# Patient Record
Sex: Female | Born: 1945
Health system: Southern US, Community
[De-identification: ages and names within clinical notes are randomized; demographics above are authoritative.]

## PROBLEM LIST (undated history)

## (undated) DIAGNOSIS — N200 Calculus of kidney: Secondary | ICD-10-CM

## (undated) DIAGNOSIS — I6529 Occlusion and stenosis of unspecified carotid artery: Secondary | ICD-10-CM

## (undated) DIAGNOSIS — Z8619 Personal history of other infectious and parasitic diseases: Secondary | ICD-10-CM

## (undated) DIAGNOSIS — E785 Hyperlipidemia, unspecified: Secondary | ICD-10-CM

## (undated) DIAGNOSIS — E079 Disorder of thyroid, unspecified: Secondary | ICD-10-CM

## (undated) DIAGNOSIS — M810 Age-related osteoporosis without current pathological fracture: Secondary | ICD-10-CM

## (undated) DIAGNOSIS — H269 Unspecified cataract: Secondary | ICD-10-CM

## (undated) DIAGNOSIS — I10 Essential (primary) hypertension: Secondary | ICD-10-CM

## (undated) HISTORY — DX: Calculus of kidney: N20.0

## (undated) HISTORY — DX: Personal history of other infectious and parasitic diseases: Z86.19

## (undated) HISTORY — DX: Essential (primary) hypertension: I10

## (undated) HISTORY — DX: Disorder of thyroid, unspecified: E07.9

## (undated) HISTORY — DX: Occlusion and stenosis of unspecified carotid artery: I65.29

## (undated) HISTORY — DX: Age-related osteoporosis without current pathological fracture: M81.0

## (undated) HISTORY — DX: Hyperlipidemia, unspecified: E78.5

## (undated) HISTORY — PX: OOPHORECTOMY: SHX86

## (undated) HISTORY — DX: Unspecified cataract: H26.9

---

## 1970-10-16 DIAGNOSIS — N200 Calculus of kidney: Secondary | ICD-10-CM

## 1970-10-16 HISTORY — DX: Calculus of kidney: N20.0

## 1981-10-16 HISTORY — PX: ABDOMINAL HYSTERECTOMY: SHX81

## 1998-10-06 ENCOUNTER — Other Ambulatory Visit: Admission: RE | Admit: 1998-10-06 | Discharge: 1998-10-06 | Payer: Self-pay | Admitting: Obstetrics and Gynecology

## 1999-10-19 ENCOUNTER — Other Ambulatory Visit: Admission: RE | Admit: 1999-10-19 | Discharge: 1999-10-19 | Payer: Self-pay | Admitting: Obstetrics and Gynecology

## 2000-05-25 ENCOUNTER — Encounter (INDEPENDENT_AMBULATORY_CARE_PROVIDER_SITE_OTHER): Payer: Self-pay | Admitting: Specialist

## 2000-05-25 ENCOUNTER — Other Ambulatory Visit: Admission: RE | Admit: 2000-05-25 | Discharge: 2000-05-25 | Payer: Self-pay | Admitting: Obstetrics and Gynecology

## 2000-11-08 ENCOUNTER — Other Ambulatory Visit: Admission: RE | Admit: 2000-11-08 | Discharge: 2000-11-08 | Payer: Self-pay | Admitting: Obstetrics and Gynecology

## 2001-11-11 ENCOUNTER — Other Ambulatory Visit: Admission: RE | Admit: 2001-11-11 | Discharge: 2001-11-11 | Payer: Self-pay | Admitting: Gynecology

## 2002-11-12 ENCOUNTER — Other Ambulatory Visit: Admission: RE | Admit: 2002-11-12 | Discharge: 2002-11-12 | Payer: Self-pay | Admitting: Gynecology

## 2003-12-25 ENCOUNTER — Other Ambulatory Visit: Admission: RE | Admit: 2003-12-25 | Discharge: 2003-12-25 | Payer: Self-pay | Admitting: Gynecology

## 2005-04-10 ENCOUNTER — Other Ambulatory Visit: Admission: RE | Admit: 2005-04-10 | Discharge: 2005-04-10 | Payer: Self-pay | Admitting: Gynecology

## 2006-06-04 ENCOUNTER — Other Ambulatory Visit: Admission: RE | Admit: 2006-06-04 | Discharge: 2006-06-04 | Payer: Self-pay | Admitting: Gynecology

## 2007-06-06 ENCOUNTER — Other Ambulatory Visit: Admission: RE | Admit: 2007-06-06 | Discharge: 2007-06-06 | Payer: Self-pay | Admitting: Gynecology

## 2008-10-16 DIAGNOSIS — E079 Disorder of thyroid, unspecified: Secondary | ICD-10-CM

## 2008-10-16 HISTORY — DX: Disorder of thyroid, unspecified: E07.9

## 2011-01-30 ENCOUNTER — Encounter: Payer: Self-pay | Admitting: Internal Medicine

## 2011-01-30 ENCOUNTER — Ambulatory Visit (INDEPENDENT_AMBULATORY_CARE_PROVIDER_SITE_OTHER): Payer: Medicare Other | Admitting: Internal Medicine

## 2011-01-30 DIAGNOSIS — Z0000 Encounter for general adult medical examination without abnormal findings: Secondary | ICD-10-CM

## 2011-01-30 DIAGNOSIS — E785 Hyperlipidemia, unspecified: Secondary | ICD-10-CM

## 2011-01-30 DIAGNOSIS — I10 Essential (primary) hypertension: Secondary | ICD-10-CM

## 2011-01-30 NOTE — Progress Notes (Signed)
Subjective:    Patient ID: Brittany Mathis, female    DOB: 09/23/1946, 65 y.o.   MRN: [id number]  HPI New pt - well C/o HTN and hyperlipidemia  Review of Systems  Constitutional: Negative.  Negative for fever, chills, diaphoresis, activity change, appetite change, fatigue and unexpected weight change.  HENT: Negative for hearing loss, ear pain, nosebleeds, congestion, sore throat, facial swelling, rhinorrhea, sneezing, mouth sores, trouble swallowing, neck pain, neck stiffness, postnasal drip, sinus pressure and tinnitus.   Eyes: Negative for pain, discharge, redness, itching and visual disturbance.  Respiratory: Negative for cough, chest tightness, shortness of breath, wheezing and stridor.   Cardiovascular: Negative for chest pain, palpitations and leg swelling.  Gastrointestinal: Negative for nausea, diarrhea, constipation, blood in stool, abdominal distention, anal bleeding and rectal pain.  Genitourinary: Negative for dysuria, urgency, frequency, hematuria, flank pain, vaginal bleeding, vaginal discharge, difficulty urinating, genital sores and pelvic pain.  Musculoskeletal: Positive for back pain. Negative for joint swelling, arthralgias and gait problem.  Skin: Negative.  Negative for rash.  Neurological: Negative for dizziness, tremors, seizures, syncope, speech difficulty, weakness, numbness and headaches.  Hematological: Negative for adenopathy. Does not bruise/bleed easily.  Psychiatric/Behavioral: Negative for suicidal ideas, behavioral problems, sleep disturbance, dysphoric mood and decreased concentration. The patient is not nervous/anxious.        Objective:   Physical Exam  Constitutional: She appears well-developed and well-nourished. No distress.  HENT:  Head: Normocephalic.  Right Ear: External ear normal.  Left Ear: External ear normal.  Nose: Nose normal.  Mouth/Throat: Oropharynx is clear and moist.  Eyes: Conjunctivae are normal. Pupils are equal, round, and  reactive to light. Right eye exhibits no discharge. Left eye exhibits no discharge.  Neck: Normal range of motion. Neck supple. No JVD present. No tracheal deviation present. No thyromegaly present.  Cardiovascular: Normal rate, regular rhythm and normal heart sounds.   Pulmonary/Chest: No stridor. No respiratory distress. She has no wheezes.  Abdominal: Soft. Bowel sounds are normal. She exhibits no distension and no mass. There is no tenderness. There is no rebound and no guarding.  Musculoskeletal: She exhibits tenderness (LS w/decr ROM; tight hamstrings). She exhibits no edema.  Lymphadenopathy:    She has no cervical adenopathy.  Neurological: She displays normal reflexes. No cranial nerve deficit. She exhibits normal muscle tone. Coordination normal.  Skin: No rash noted. No erythema.  Psychiatric: She has a normal mood and affect. Her behavior is normal. Judgment and thought content normal.    C/o HTN and hyperlipidemia       Assessment & Plan:  Well Exam The patient is here for annual Medicare wellness examination and management of other chronic and acute problems.   The risk factors are reflected in the social history.  The roster of all physicians providing medical care to patient - is listed in the Snapshot section of the chart.  Activities of daily living:  The patient is 100% inedpendent in all ADLs: dressing, toileting, feeding as well as independent mobility  Home safety : The patient has smoke detectors in the home. They wear seatbelts.No firearms at home ( firearms are present in the home, kept in a safe fashion). There is no violence in the home.   There is no risks for hepatitis, STDs or HIV. There is no   history of blood transfusion. They have no travel history to infectious disease endemic areas of the world.  The patient has (has not) seen their dentist in the last six month. They  have (not) seen their eye doctor in the last year. They deny (admit to) any  hearing difficulty and have not had audiologic testing in the last year.  They do not  have excessive sun exposure. Discussed the need for sun protection: hats, long sleeves and use of sunscreen if there is significant sun exposure.   Diet: the importance of a healthy diet is discussed. They do have a healthy (unhealthy-high fat/fast food) diet.  The patient has a regular exercise program: walking ______30_ , min ____duration, ____3-4_per week.  The benefits of regular aerobic exercise were discussed.  Depression screen: there are no signs or vegative symptoms of depression- irritability, change in appetite, anhedonia, sadness/tearfullness.  Cognitive assessment: the patient manages all their financial and personal affairs and is actively engaged. They could relate day,date,year and events; recalled 3/3 objects at 3 minutes; performed clock-face test normally.  The following portions of the patient's history were reviewed and updated as appropriate: allergies, current medications, past family history, past medical history,  past surgical history, past social history  and problem list.  Vision, hearing, body mass index were assessed and reviewed.   During the course of the visit the patient was educated and counseled about appropriate screening and preventive services including : fall prevention , diabetes screening, nutrition counseling, colorectal cancer screening, and recommended immunizations.   Col polyp - f/u with Dr Brittany Mathis Zostavax discussed DT advised We will get labs. Age related issues discussed. Mammo, BDS, HRT per Dr Brittany Mathis  HRT   Potential benefits of a long term HRT use as well as potential risks  and complications were explained to the patient and were aknowledged.  LBP - advised stretching, ie yoga

## 2011-01-31 ENCOUNTER — Encounter: Payer: Self-pay | Admitting: Internal Medicine

## 2011-01-31 ENCOUNTER — Telehealth: Payer: Self-pay | Admitting: Internal Medicine

## 2011-01-31 NOTE — Telephone Encounter (Signed)
Forwarded to Dr. Plotnikov for review. °

## 2011-02-06 ENCOUNTER — Encounter: Payer: Self-pay | Admitting: Internal Medicine

## 2011-02-09 ENCOUNTER — Encounter: Payer: Self-pay | Admitting: Internal Medicine

## 2011-08-07 ENCOUNTER — Encounter: Payer: Self-pay | Admitting: Internal Medicine

## 2011-08-07 ENCOUNTER — Ambulatory Visit (INDEPENDENT_AMBULATORY_CARE_PROVIDER_SITE_OTHER): Payer: Medicare Other | Admitting: Internal Medicine

## 2011-08-07 VITALS — BP 160/90 | HR 80 | Temp 97.5°F | Resp 16 | Wt 122.0 lb

## 2011-08-07 DIAGNOSIS — M5412 Radiculopathy, cervical region: Secondary | ICD-10-CM

## 2011-08-07 DIAGNOSIS — I10 Essential (primary) hypertension: Secondary | ICD-10-CM

## 2011-08-07 MED ORDER — VALSARTAN 80 MG PO TABS: 80.0000 mg | ORAL_TABLET | Freq: Every day | ORAL | Status: DC

## 2011-08-07 MED ORDER — SIMVASTATIN 40 MG PO TABS: 40.0000 mg | ORAL_TABLET | Freq: Every day | ORAL | Status: DC

## 2011-08-07 NOTE — Assessment & Plan Note (Signed)
Start Diovan if BP is

## 2011-08-07 NOTE — Patient Instructions (Addendum)
Normal BP < 130/85  BP Readings from Last 3 Encounters:  08/07/11 160/90  01/30/11 122/80

## 2011-08-07 NOTE — Progress Notes (Signed)
  Subjective:    Patient ID: Brittany Mathis, female    DOB: October 04, 1946, 65 y.o.   MRN: [id number]  HPI  The patient presents for a follow-up of  chronic hypertension, chronic dyslipidemia controlled with medicines. C/o R arm pain at times    Review of Systems  Constitutional: Negative for chills, activity change, appetite change, fatigue and unexpected weight change.  HENT: Negative for congestion, mouth sores and sinus pressure.   Eyes: Negative for visual disturbance.  Respiratory: Negative for cough and chest tightness.   Gastrointestinal: Negative for nausea and abdominal pain.  Genitourinary: Negative for frequency, difficulty urinating and vaginal pain.  Musculoskeletal: Negative for back pain and gait problem.  Skin: Negative for pallor and rash.  Neurological: Negative for dizziness, tremors, weakness, numbness and headaches.  Psychiatric/Behavioral: Negative for confusion and sleep disturbance.       Objective:   Physical Exam  Constitutional: She appears well-developed and well-nourished. No distress.  HENT:  Head: Normocephalic.  Right Ear: External ear normal.  Left Ear: External ear normal.  Nose: Nose normal.  Mouth/Throat: Oropharynx is clear and moist.  Eyes: Conjunctivae are normal. Pupils are equal, round, and reactive to light. Right eye exhibits no discharge. Left eye exhibits no discharge.  Neck: Normal range of motion. Neck supple. No JVD present. No tracheal deviation present. No thyromegaly present.  Cardiovascular: Normal rate, regular rhythm and normal heart sounds.   Pulmonary/Chest: No stridor. No respiratory distress. She has no wheezes.  Abdominal: Soft. Bowel sounds are normal. She exhibits no distension and no mass. There is no tenderness. There is no rebound and no guarding.  Musculoskeletal: She exhibits no edema and no tenderness.  Lymphadenopathy:    She has no cervical adenopathy.  Neurological: She displays normal reflexes. No cranial nerve  deficit. She exhibits normal muscle tone. Coordination normal.  Skin: No rash noted. No erythema.  Psychiatric: She has a normal mood and affect. Her behavior is normal. Judgment and thought content normal.          Assessment & Plan:

## 2011-11-07 ENCOUNTER — Other Ambulatory Visit: Payer: Self-pay

## 2011-11-07 MED ORDER — SIMVASTATIN 40 MG PO TABS: 40.0000 mg | ORAL_TABLET | Freq: Every day | ORAL | Status: DC

## 2011-11-07 MED ORDER — VALSARTAN 80 MG PO TABS: 80.0000 mg | ORAL_TABLET | Freq: Every day | ORAL | Status: DC

## 2011-11-08 ENCOUNTER — Telehealth: Payer: Self-pay

## 2011-11-08 NOTE — Telephone Encounter (Signed)
Needs OV w/any MD I can see her on 1/24  1:30 pm or so Thx

## 2011-11-08 NOTE — Telephone Encounter (Signed)
The patients right lower arm has become swollen, raised, bruised and sore all since yesterday afternoon. The patient is concerned about a blood clot. Call back  Number is [phone]

## 2011-11-09 ENCOUNTER — Ambulatory Visit (INDEPENDENT_AMBULATORY_CARE_PROVIDER_SITE_OTHER): Payer: Medicare Other | Admitting: Internal Medicine

## 2011-11-09 ENCOUNTER — Encounter: Payer: Self-pay | Admitting: Internal Medicine

## 2011-11-09 DIAGNOSIS — T148XXA Other injury of unspecified body region, initial encounter: Secondary | ICD-10-CM

## 2011-11-09 DIAGNOSIS — K130 Diseases of lips: Secondary | ICD-10-CM

## 2011-11-09 DIAGNOSIS — I10 Essential (primary) hypertension: Secondary | ICD-10-CM

## 2011-11-09 DIAGNOSIS — S40029A Contusion of unspecified upper arm, initial encounter: Secondary | ICD-10-CM

## 2011-11-09 MED ORDER — CLOTRIMAZOLE-BETAMETHASONE 1-0.05 % EX CREA: TOPICAL_CREAM | Freq: Two times a day (BID) | CUTANEOUS | Status: DC

## 2011-11-09 NOTE — Assessment & Plan Note (Signed)
Lotrisone 

## 2011-11-09 NOTE — Telephone Encounter (Signed)
Pt informed. OV scheduled.

## 2011-11-09 NOTE — Progress Notes (Signed)
  Subjective:    Patient ID: Brittany Mathis, female    DOB: [birth year], 66 y.o.   MRN: [id number]  HPI  C/o R forearm bruise,  2 d ago C/o skin irritation in the corners of her mouth x 2 wks F/u HTN  Review of Systems  Constitutional: Negative for chills, activity change, appetite change, fatigue and unexpected weight change.  HENT: Negative for congestion, mouth sores and sinus pressure.   Eyes: Negative for visual disturbance.  Respiratory: Negative for cough and chest tightness.   Gastrointestinal: Negative for nausea and abdominal pain.  Genitourinary: Negative for frequency, difficulty urinating and vaginal pain.  Musculoskeletal: Negative for back pain and gait problem.  Skin: Negative for pallor and rash.       occ bruises  Neurological: Negative for dizziness, tremors, weakness, numbness and headaches.  Psychiatric/Behavioral: Negative for confusion and sleep disturbance.       Objective:   Physical Exam  Constitutional: She appears well-developed. No distress.  HENT:  Head: Normocephalic.  Right Ear: External ear normal.  Left Ear: External ear normal.  Nose: Nose normal.  Mouth/Throat: Oropharynx is clear and moist.  Eyes: Conjunctivae are normal. Pupils are equal, round, and reactive to light. Right eye exhibits no discharge. Left eye exhibits no discharge.  Neck: Normal range of motion. Neck supple. No JVD present. No tracheal deviation present. No thyromegaly present.  Cardiovascular: Normal rate, regular rhythm and normal heart sounds.   Pulmonary/Chest: No stridor. No respiratory distress. She has no wheezes.  Abdominal: Soft. Bowel sounds are normal. She exhibits no distension and no mass. There is no tenderness. There is no rebound and no guarding.  Musculoskeletal: She exhibits no edema and no tenderness.  Lymphadenopathy:    She has no cervical adenopathy.  Neurological: She displays normal reflexes. No cranial nerve deficit. She exhibits normal muscle tone.  Coordination normal.  Skin: No rash noted. No erythema.       R forearm 5x2 cm bruise with a 1 cm palpable sq knot  Psychiatric: She has a normal mood and affect. Her behavior is normal. Judgment and thought content normal.      Procedure Note :    Procedure :   Point of care (POC) sonography examination   Indication: L forearm bruise, r/o phlebitis   Equipment used: Sonosite M-Turbo with HFL38x/13-6 MHz transducer linear probe. The images were stored in the unit and later transferred in storage.  The patient was placed in a sitting position.  This study revealed a heteroechoic small  lesion in the R mid palmar forearm c/w hematoma. Superficial veins are potent and compressible   Impression: Small hematoma. No phlebitis.       Assessment & Plan:

## 2011-11-09 NOTE — Assessment & Plan Note (Signed)
Valsartan is in the mail: start asap

## 2011-11-09 NOTE — Assessment & Plan Note (Signed)
Hold ASA?

## 2012-02-05 ENCOUNTER — Ambulatory Visit (INDEPENDENT_AMBULATORY_CARE_PROVIDER_SITE_OTHER): Payer: Medicare Other | Admitting: Internal Medicine

## 2012-02-05 ENCOUNTER — Other Ambulatory Visit (INDEPENDENT_AMBULATORY_CARE_PROVIDER_SITE_OTHER): Payer: Medicare Other

## 2012-02-05 ENCOUNTER — Encounter: Payer: Self-pay | Admitting: Internal Medicine

## 2012-02-05 VITALS — BP 150/90 | HR 80 | Temp 97.5°F | Resp 16 | Ht 64.0 in | Wt 125.0 lb

## 2012-02-05 DIAGNOSIS — E785 Hyperlipidemia, unspecified: Secondary | ICD-10-CM

## 2012-02-05 DIAGNOSIS — I10 Essential (primary) hypertension: Secondary | ICD-10-CM

## 2012-02-05 DIAGNOSIS — R1032 Left lower quadrant pain: Secondary | ICD-10-CM | POA: Insufficient documentation

## 2012-02-05 DIAGNOSIS — Z136 Encounter for screening for cardiovascular disorders: Secondary | ICD-10-CM

## 2012-02-05 DIAGNOSIS — Z0000 Encounter for general adult medical examination without abnormal findings: Secondary | ICD-10-CM | POA: Insufficient documentation

## 2012-02-05 DIAGNOSIS — R3129 Other microscopic hematuria: Secondary | ICD-10-CM

## 2012-02-05 DIAGNOSIS — Z23 Encounter for immunization: Secondary | ICD-10-CM

## 2012-02-05 LAB — CBC WITH DIFFERENTIAL/PLATELET
Basophils Absolute: 0 K/uL (ref 0.0–0.1)
Basophils Relative: 0.4 % (ref 0.0–3.0)
Eosinophils Absolute: 0.4 K/uL (ref 0.0–0.7)
Eosinophils Relative: 8.1 % — ABNORMAL HIGH (ref 0.0–5.0)
HCT: 38.8 % (ref 36.0–46.0)
Hemoglobin: 13.1 g/dL (ref 12.0–15.0)
Lymphocytes Relative: 32.4 % (ref 12.0–46.0)
Lymphs Abs: 1.7 K/uL (ref 0.7–4.0)
MCHC: 33.7 g/dL (ref 30.0–36.0)
MCV: 88 fl (ref 78.0–100.0)
Monocytes Absolute: 0.5 K/uL (ref 0.1–1.0)
Monocytes Relative: 9.6 % (ref 3.0–12.0)
Neutro Abs: 2.6 K/uL (ref 1.4–7.7)
Neutrophils Relative %: 49.5 % (ref 43.0–77.0)
Platelets: 206 K/uL (ref 150.0–400.0)
RBC: 4.41 Mil/uL (ref 3.87–5.11)
RDW: 12.7 % (ref 11.5–14.6)
WBC: 5.3 K/uL (ref 4.5–10.5)

## 2012-02-05 LAB — LDL CHOLESTEROL, DIRECT: Direct LDL: 109.2 mg/dL

## 2012-02-05 LAB — URINALYSIS, ROUTINE W REFLEX MICROSCOPIC
Ketones, ur: NEGATIVE
Urine Glucose: NEGATIVE
Urobilinogen, UA: 0.2 (ref 0.0–1.0)

## 2012-02-05 LAB — HEPATIC FUNCTION PANEL
ALT: 20 U/L (ref 0–35)
AST: 25 U/L (ref 0–37)
Albumin: 4.5 g/dL (ref 3.5–5.2)
Alkaline Phosphatase: 41 U/L (ref 39–117)
Bilirubin, Direct: 0.1 mg/dL (ref 0.0–0.3)
Total Bilirubin: 0.7 mg/dL (ref 0.3–1.2)
Total Protein: 7.2 g/dL (ref 6.0–8.3)

## 2012-02-05 LAB — BASIC METABOLIC PANEL WITH GFR
BUN: 17 mg/dL (ref 6–23)
CO2: 29 meq/L (ref 19–32)
Calcium: 9.6 mg/dL (ref 8.4–10.5)
Chloride: 103 meq/L (ref 96–112)
Creatinine, Ser: 0.6 mg/dL (ref 0.4–1.2)
GFR: 100.08 mL/min
Glucose, Bld: 94 mg/dL (ref 70–99)
Potassium: 4.8 meq/L (ref 3.5–5.1)
Sodium: 140 meq/L (ref 135–145)

## 2012-02-05 LAB — LIPID PANEL
Cholesterol: 226 mg/dL — ABNORMAL HIGH (ref 0–200)
HDL: 106.5 mg/dL
Total CHOL/HDL Ratio: 2
Triglycerides: 54 mg/dL (ref 0.0–149.0)
VLDL: 10.8 mg/dL (ref 0.0–40.0)

## 2012-02-05 LAB — TSH: TSH: 3.46 u[IU]/mL (ref 0.35–5.50)

## 2012-02-05 MED ORDER — VALSARTAN 160 MG PO TABS: 160.0000 mg | ORAL_TABLET | Freq: Every day | ORAL | Status: DC

## 2012-02-05 MED ORDER — SIMVASTATIN 20 MG PO TABS: 20.0000 mg | ORAL_TABLET | Freq: Every day | ORAL | Status: DC

## 2012-02-05 NOTE — Progress Notes (Deleted)
  Subjective:    Patient ID: Brittany Mathis, female    DOB: 1946-02-02, 66 y.o.   MRN: [id number]  HPI  The patient is here for a wellness exam. The patient has been doing well overall without major physical or psychological issues going on lately.  Review of Systems     Objective:   Physical Exam        Assessment & Plan:

## 2012-02-05 NOTE — Assessment & Plan Note (Signed)
Increase Diovan dose

## 2012-02-05 NOTE — Assessment & Plan Note (Signed)
Will reduce Simvastatin

## 2012-02-05 NOTE — Patient Instructions (Signed)
Please see Dr Hazle Quant

## 2012-02-05 NOTE — Assessment & Plan Note (Signed)
4/13  X 10 d CT suggested Labs

## 2012-02-06 ENCOUNTER — Telehealth: Payer: Self-pay | Admitting: Internal Medicine

## 2012-02-06 DIAGNOSIS — R1032 Left lower quadrant pain: Secondary | ICD-10-CM

## 2012-02-06 NOTE — Telephone Encounter (Signed)
Misty Stanley, please, inform patient that all labs are normal except for red blood cells in the urine presence. I will order an abd CT to r/o kidney stones etc Thx

## 2012-02-06 NOTE — Telephone Encounter (Signed)
Pt informed

## 2012-02-08 ENCOUNTER — Ambulatory Visit (INDEPENDENT_AMBULATORY_CARE_PROVIDER_SITE_OTHER)
Admission: RE | Admit: 2012-02-08 | Discharge: 2012-02-08 | Disposition: A | Discharge status: Home / Self Care | Payer: Medicare Other | Source: Ambulatory Visit | Attending: Internal Medicine | Admitting: Internal Medicine

## 2012-02-08 ENCOUNTER — Telehealth: Payer: Self-pay | Admitting: Internal Medicine

## 2012-02-08 DIAGNOSIS — R1032 Left lower quadrant pain: Secondary | ICD-10-CM

## 2012-02-08 DIAGNOSIS — R319 Hematuria, unspecified: Secondary | ICD-10-CM

## 2012-02-08 NOTE — Telephone Encounter (Signed)
Brittany Mathis, please, inform patient that the CT is showing B kidney stones. We need to sch  A urol consult. Is it ok to schedule? Thx

## 2012-02-09 NOTE — Telephone Encounter (Signed)
Pt informed

## 2012-02-11 DIAGNOSIS — R3129 Other microscopic hematuria: Secondary | ICD-10-CM | POA: Insufficient documentation

## 2012-02-11 NOTE — Assessment & Plan Note (Signed)
CT abd Urol cons

## 2012-02-11 NOTE — Progress Notes (Signed)
Patient ID: Brittany Mathis, female   DOB: 1946/04/04, 66 y.o.   MRN: [id number]  Subjective:    Patient ID: Brittany Mathis, female    DOB: 1946-10-02, 66 y.o.   MRN: [id number]  HPI The patient is here for a wellness exam. The patient has been doing well overall without major physical or psychological issues going on lately, except for pain in LLQ x 1-2 wks - mild to moderate. No n/v; no chills   The patient presents for a follow-up of  chronic hypertension, chronic dyslipidemia controlled with medicines. C/o R arm pain at times  Wt Readings from Last 3 Encounters:  02/05/12 125 lb (56.7 kg)  11/09/11 128 lb (58.06 kg)  08/07/11 122 lb (55.339 kg)   BP Readings from Last 3 Encounters:  02/05/12 150/90  11/09/11 150/86  08/07/11 160/90      Review of Systems  Constitutional: Negative for fever, chills, diaphoresis, activity change, appetite change, fatigue and unexpected weight change.  HENT: Negative for hearing loss, ear pain, congestion, sore throat, sneezing, mouth sores, neck pain, dental problem, voice change, postnasal drip and sinus pressure.   Eyes: Negative for pain and visual disturbance.  Respiratory: Negative for cough, chest tightness, wheezing and stridor.   Cardiovascular: Negative for chest pain, palpitations and leg swelling.  Gastrointestinal: Positive for abdominal pain. Negative for nausea, vomiting, blood in stool, abdominal distention and rectal pain.  Genitourinary: Negative for dysuria, frequency, hematuria, decreased urine volume, vaginal bleeding, vaginal discharge, difficulty urinating, vaginal pain and menstrual problem.  Musculoskeletal: Negative for back pain, joint swelling and gait problem.  Skin: Negative for color change, pallor, rash and wound.  Neurological: Negative for dizziness, tremors, syncope, speech difficulty, weakness, light-headedness, numbness and headaches.  Hematological: Negative for adenopathy.  Psychiatric/Behavioral: Negative for  suicidal ideas, hallucinations, behavioral problems, confusion, sleep disturbance, dysphoric mood and decreased concentration. The patient is not hyperactive.        Objective:   Physical Exam  Constitutional: She appears well-developed and well-nourished. No distress.  HENT:  Head: Normocephalic.  Right Ear: External ear normal.  Left Ear: External ear normal.  Nose: Nose normal.  Mouth/Throat: Oropharynx is clear and moist.  Eyes: Conjunctivae are normal. Pupils are equal, round, and reactive to light. Right eye exhibits no discharge. Left eye exhibits no discharge.  Neck: Normal range of motion. Neck supple. No JVD present. No tracheal deviation present. No thyromegaly present.  Cardiovascular: Normal rate, regular rhythm and normal heart sounds.   Pulmonary/Chest: No stridor. No respiratory distress. She has no wheezes. She exhibits no tenderness.  Abdominal: Soft. Bowel sounds are normal. She exhibits no distension and no mass. There is tenderness (LLQ was a little sensitive to palpation). There is no rebound and no guarding.  Musculoskeletal: She exhibits no edema and no tenderness.  Lymphadenopathy:    She has no cervical adenopathy.  Neurological: She displays normal reflexes. No cranial nerve deficit. She exhibits normal muscle tone. Coordination normal.  Skin: No rash noted. No erythema.  Psychiatric: She has a normal mood and affect. Her behavior is normal. Judgment and thought content normal.   Lab Results  Component Value Date   WBC 5.3 02/05/2012   HGB 13.1 02/05/2012   HCT 38.8 02/05/2012   PLT 206.0 02/05/2012   GLUCOSE 94 02/05/2012   CHOL 226* 02/05/2012   TRIG 54.0 02/05/2012   HDL 106.50 02/05/2012   LDLDIRECT 109.2 02/05/2012   ALT 20 02/05/2012   AST 25 02/05/2012   NA  140 02/05/2012   K 4.8 02/05/2012   CL 103 02/05/2012   CREATININE 0.6 02/05/2012   BUN 17 02/05/2012   CO2 29 02/05/2012   TSH 3.46 02/05/2012          Assessment & Plan:

## 2012-02-19 ENCOUNTER — Telehealth: Payer: Self-pay

## 2012-02-19 DIAGNOSIS — N200 Calculus of kidney: Secondary | ICD-10-CM

## 2012-02-19 NOTE — Telephone Encounter (Signed)
Patient called LM on triage VM stating that nurse called 02/09/12 to advise of bilateral kidney stones on CT scan. Per pt she was told that MD would set up a urology referral and would like to check the status. Please advise, didn't find a referral in Epic Thanks

## 2012-02-19 NOTE — Telephone Encounter (Signed)
I was sure I  Have put it in - sorry if I did not do it Will re-do Thx

## 2012-02-19 NOTE — Telephone Encounter (Signed)
[facility] to notify 

## 2012-03-12 ENCOUNTER — Encounter: Payer: Self-pay | Admitting: Internal Medicine

## 2012-06-06 ENCOUNTER — Encounter: Payer: Self-pay | Admitting: Internal Medicine

## 2012-06-06 ENCOUNTER — Ambulatory Visit (INDEPENDENT_AMBULATORY_CARE_PROVIDER_SITE_OTHER): Payer: Medicare Other | Admitting: Internal Medicine

## 2012-06-06 VITALS — BP 130/80 | HR 80 | Temp 96.8°F | Resp 16 | Wt 128.0 lb

## 2012-06-06 DIAGNOSIS — R1032 Left lower quadrant pain: Secondary | ICD-10-CM

## 2012-06-06 DIAGNOSIS — N200 Calculus of kidney: Secondary | ICD-10-CM

## 2012-06-06 DIAGNOSIS — R3129 Other microscopic hematuria: Secondary | ICD-10-CM

## 2012-06-06 DIAGNOSIS — I10 Essential (primary) hypertension: Secondary | ICD-10-CM

## 2012-06-06 NOTE — Progress Notes (Signed)
   Subjective:    Patient ID: Brittany Mathis, female    DOB: 10-22-1945, 66 y.o.   MRN: [id number]  HPI F/u on pain in LLQ x 1-2 wks - mild to moderate. No n/v; no chills - all resolved   The patient presents for a follow-up of  chronic hypertension, chronic dyslipidemia controlled with medicines.   Wt Readings from Last 3 Encounters:  06/06/12 128 lb (58.06 kg)  02/05/12 125 lb (56.7 kg)  11/09/11 128 lb (58.06 kg)   BP Readings from Last 3 Encounters:  06/06/12 130/80  02/05/12 150/90  11/09/11 150/86      Review of Systems  Constitutional: Negative for fever, chills, diaphoresis, activity change, appetite change, fatigue and unexpected weight change.  HENT: Negative for hearing loss, ear pain, congestion, sore throat, sneezing, mouth sores, neck pain, dental problem, voice change, postnasal drip and sinus pressure.   Eyes: Negative for pain and visual disturbance.  Respiratory: Negative for cough, chest tightness, wheezing and stridor.   Cardiovascular: Negative for chest pain, palpitations and leg swelling.  Gastrointestinal: Negative for nausea, vomiting, abdominal pain, blood in stool, abdominal distention and rectal pain.  Genitourinary: Negative for dysuria, frequency, hematuria, decreased urine volume, vaginal bleeding, vaginal discharge, difficulty urinating, vaginal pain and menstrual problem.  Musculoskeletal: Negative for back pain, joint swelling and gait problem.  Skin: Negative for color change, pallor, rash and wound.  Neurological: Negative for dizziness, tremors, syncope, speech difficulty, weakness, light-headedness, numbness and headaches.  Hematological: Negative for adenopathy.  Psychiatric/Behavioral: Negative for suicidal ideas, hallucinations, behavioral problems, confusion, disturbed wake/sleep cycle, dysphoric mood and decreased concentration. The patient is not hyperactive.        Objective:   Physical Exam  Constitutional: She appears well-developed  and well-nourished. No distress.  HENT:  Head: Normocephalic.  Right Ear: External ear normal.  Left Ear: External ear normal.  Nose: Nose normal.  Mouth/Throat: Oropharynx is clear and moist.  Eyes: Conjunctivae are normal. Pupils are equal, round, and reactive to light. Right eye exhibits no discharge. Left eye exhibits no discharge.  Neck: Normal range of motion. Neck supple. No JVD present. No tracheal deviation present. No thyromegaly present.  Cardiovascular: Normal rate, regular rhythm and normal heart sounds.   Pulmonary/Chest: No stridor. No respiratory distress. She has no wheezes. She exhibits no tenderness.  Abdominal: Soft. Bowel sounds are normal. She exhibits no distension and no mass. There is no tenderness. There is no rebound and no guarding.  Musculoskeletal: She exhibits no edema and no tenderness.  Lymphadenopathy:    She has no cervical adenopathy.  Neurological: She displays normal reflexes. No cranial nerve deficit. She exhibits normal muscle tone. Coordination normal.  Skin: No rash noted. No erythema.  Psychiatric: She has a normal mood and affect. Her behavior is normal. Judgment and thought content normal.   Lab Results  Component Value Date   WBC 5.3 02/05/2012   HGB 13.1 02/05/2012   HCT 38.8 02/05/2012   PLT 206.0 02/05/2012   GLUCOSE 94 02/05/2012   CHOL 226* 02/05/2012   TRIG 54.0 02/05/2012   HDL 106.50 02/05/2012   LDLDIRECT 109.2 02/05/2012   ALT 20 02/05/2012   AST 25 02/05/2012   NA 140 02/05/2012   K 4.8 02/05/2012   CL 103 02/05/2012   CREATININE 0.6 02/05/2012   BUN 17 02/05/2012   CO2 29 02/05/2012   TSH 3.46 02/05/2012          Assessment & Plan:

## 2012-06-06 NOTE — Assessment & Plan Note (Signed)
F/u in Dec 2013

## 2012-06-06 NOTE — Assessment & Plan Note (Signed)
S/p Urol cons, cystoscopy

## 2012-06-06 NOTE — Assessment & Plan Note (Signed)
Better - cont Rx 

## 2012-06-06 NOTE — Assessment & Plan Note (Signed)
Resolved

## 2012-07-09 ENCOUNTER — Ambulatory Visit (INDEPENDENT_AMBULATORY_CARE_PROVIDER_SITE_OTHER)
Admission: RE | Admit: 2012-07-09 | Discharge: 2012-07-09 | Disposition: A | Discharge status: Home / Self Care | Payer: Medicare Other | Source: Ambulatory Visit | Attending: Internal Medicine | Admitting: Internal Medicine

## 2012-07-09 ENCOUNTER — Encounter: Payer: Self-pay | Admitting: Internal Medicine

## 2012-07-09 ENCOUNTER — Ambulatory Visit (INDEPENDENT_AMBULATORY_CARE_PROVIDER_SITE_OTHER): Payer: Medicare Other | Admitting: Internal Medicine

## 2012-07-09 VITALS — BP 140/90 | HR 77 | Temp 97.1°F | Resp 16 | Wt 126.2 lb

## 2012-07-09 DIAGNOSIS — S0990XA Unspecified injury of head, initial encounter: Secondary | ICD-10-CM

## 2012-07-09 DIAGNOSIS — S098XXA Other specified injuries of head, initial encounter: Secondary | ICD-10-CM

## 2012-07-09 DIAGNOSIS — S1093XA Contusion of unspecified part of neck, initial encounter: Secondary | ICD-10-CM

## 2012-07-09 DIAGNOSIS — S0003XA Contusion of scalp, initial encounter: Secondary | ICD-10-CM

## 2012-07-09 MED ORDER — HYDROCODONE-ACETAMINOPHEN 5-500 MG PO TABS: 1.0000 | ORAL_TABLET | Freq: Three times a day (TID) | ORAL | Status: DC | PRN

## 2012-07-09 NOTE — Patient Instructions (Addendum)
It was good to see you today. we'll make referral for head ct today. Our office will contact you regarding appointment(s) once made. Your results will be called to you after review. If any changes need to be made, you will be notified at that time. Hydrocodone if needed for pain - Your prescription(s) have been submitted to your pharmacy. Please take as directed and contact our office if you believe you are having problem(s) with the medication(s). Blunt Trauma You have been evaluated for injuries. You have been examined and your caregiver has not found injuries serious enough to require hospitalization. It is common to have multiple bruises and sore muscles following an accident. These tend to feel worse for the first 24 hours. You will feel more stiffness and soreness over the next several hours and worse when you wake up the first morning after your accident. After this point, you should begin to improve with each passing day. The amount of improvement depends on the amount of damage done in the accident. Following your accident, if some part of your body does not work as it should, or if the pain in any area continues to increase, you should return to the Emergency Department for re-evaluation.   HOME CARE INSTRUCTIONS   Routine care for sore areas should include:  Ice to sore areas every 2 hours for 20 minutes while awake for the next 2 days.   Drink extra fluids (not alcohol).   Take a hot or warm shower or bath once or twice a day to increase blood flow to sore muscles. This will help you "limber up".   Activity as tolerated. Lifting may aggravate neck or back pain.   Only take over-the-counter or prescription medicines for pain, discomfort, or fever as directed by your caregiver. Do not use aspirin. This may increase bruising or increase bleeding if there are small areas where this is happening.  SEEK IMMEDIATE MEDICAL CARE IF:  Numbness, tingling, weakness, or problem with the use of  your arms or legs.   A severe headache is not relieved with medications.   There is a change in bowel or bladder control.   Increasing pain in any areas of the body.   Short of breath or dizzy.   Nauseated, vomiting, or sweating.   Increasing belly (abdominal) discomfort.   Blood in urine, stool, or vomiting blood.   Pain in either shoulder in an area where a shoulder strap would be.   Feelings of lightheadedness or if you have a fainting episode.  Sometimes it is not possible to identify all injuries immediately after the trauma. It is important that you continue to monitor your condition after the emergency department visit. If you feel you are not improving, or improving more slowly than should be expected, call your physician. If you feel your symptoms (problems) are worsening, return to the Emergency Department immediately. Document Released: 06/28/2001 Document Revised: 09/21/2011 Document Reviewed: 05/20/2008 [facility] Patient Information 2012 [place], [place].Hematoma A hematoma is a pocket of blood. The pocket of blood can turn into a hard, painful lump under the skin. Your skin may turn blue or yellow. Most hematomas get better in a few days to weeks. Some hematomas are serious and need medical care. Hematomas can be very small or very big. HOME CARE  Put ice on the injured area.   Put ice in a plastic bag.   Place a towel between your skin and the bag.   Leave the ice on for 15 to  20 minutes, 3 to 4 times a day for the first 1 to 2 days.   After the first 2 days, switch to using warm packs on the injured area.   Raise (elevate) the injured area to lessen pain and puffiness (swelling). You may also wrap the area with an elastic bandage. Make sure the bandage is not wrapped too tight.   If you have a painful hematoma on your leg or foot, you may use crutches for a couple days.   Only take medicines as told by your doctor.  GET HELP RIGHT AWAY IF:    Your pain gets  worse.   Your pain is not controlled with medicine.   You have a fever.   Your puffiness gets worse.   Your skin turns more blue or yellow.   Your skin over the hematoma breaks or starts bleeding.  MAKE SURE YOU:    Understand these instructions.   Will watch your condition.   Will get help right away if you are not doing well or get worse.  Document Released: 11/09/2004 Document Revised: 09/21/2011 Document Reviewed: 06/05/2011 [facility] Patient Information 2012 [place], [place].

## 2012-07-09 NOTE — Progress Notes (Signed)
Subjective:    Patient ID: Brittany Mathis, female    DOB: 09-04-1946, 66 y.o.   MRN: [id number]  HPI  Complains of accidental fall and subsequent hematoma. Onset last night 9:30 PM while in the bathroom, preparing for bed Report spontaneously losing balance and falling to ground, hit for head against the tile floor Denies loss of consciousness, no incontinence Independently arose from floor, placed ice to area of impact Residual mild throbbing headache right frontal side with increased bruising and swelling this a.m. Denies anticoagulation use, occasional aspirin 81 or NSAIDs use No balance problems, weakness, vision changes, speech difficulty Denies history of prior falls Lost tooth crown, to be repaired this morning - denies other injury  Past Medical History  Diagnosis Date  . Hyperlipidemia   . Hypertension   . Nephrolithiasis 1972  . Herpes zoster infection 2010  . Thyroid disease 2010    thyroiditis    Review of Systems  Constitutional: Negative for activity change and appetite change.  HENT: Negative for ear pain, congestion, facial swelling, rhinorrhea, neck pain and sinus pressure.   Eyes: Negative for pain and visual disturbance.  Respiratory: Negative for cough, shortness of breath and wheezing.   Cardiovascular: Negative for chest pain and leg swelling.  Musculoskeletal: Negative for myalgias, back pain, joint swelling and gait problem.  Neurological: Positive for headaches. Negative for dizziness, tremors, seizures, facial asymmetry, speech difficulty, weakness, light-headedness and numbness.  Psychiatric/Behavioral: Negative for confusion.       Objective:   Physical Exam BP 140/90  Pulse 77  Temp 97.1 F (36.2 C) (Oral)  Resp 16  Wt 126 lb 4 oz (57.267 kg)  SpO2 96% Wt Readings from Last 3 Encounters:  07/09/12 126 lb 4 oz (57.267 kg)  06/06/12 128 lb (58.06 kg)  02/05/12 125 lb (56.7 kg)   Constitutional: She appears well-developed and  well-nourished. No distress.  HENT: Head: Large soft tissue hematoma over right frontal scalp and forehead. Trauma impact at right upper scalp hairline. No laceration. Ears: B TMs ok, no erythema or effusion; Nose: Nose normal.  Mouth/Throat: Oropharynx is clear and moist. No oropharyngeal exudate.  missing crown right side Eyes: Bruise and soft tissue edema over right upper eyelid -vision bilaterally grossly intact. Conjunctivae and EOM are normal. Pupils are equal, round, and reactive to light. No scleral icterus.  Neck: Normal range of motion. Neck supple. No JVD present. No thyromegaly present.  Cardiovascular: Normal rate, regular rhythm and normal heart sounds.  No murmur heard. No BLE edema. Pulmonary/Chest: Effort normal and breath sounds normal. No respiratory distress. She has no wheezes.  Neurological: She is alert and oriented to person, place, and time. No cranial nerve deficit. Coordination, speech, recall, balance and gait all normal.  Skin: Bruising and hematoma right forehead and scalp as noted above -remaining skin is warm and dry. No rash noted. No erythema.  Psychiatric: She has a normal mood and affect. Her behavior is normal. Judgment and thought content normal.   Lab Results  Component Value Date   WBC 5.3 02/05/2012   HGB 13.1 02/05/2012   HCT 38.8 02/05/2012   PLT 206.0 02/05/2012   GLUCOSE 94 02/05/2012   CHOL 226* 02/05/2012   TRIG 54.0 02/05/2012   HDL 106.50 02/05/2012   LDLDIRECT 109.2 02/05/2012   ALT 20 02/05/2012   AST 25 02/05/2012   NA 140 02/05/2012   K 4.8 02/05/2012   CL 103 02/05/2012   CREATININE 0.6 02/05/2012   BUN 17 02/05/2012  CO2 29 02/05/2012   TSH 3.46 02/05/2012       Assessment & Plan:   CHI - blunt trauma, R frontal scalp hematoma - no neuro deficits on exam but feels lightheaded Accidental trauma from fall - denies preceding syncope, palpitations or chest pain   Check head ct r/o SDH Hydrocodone for pain as needed  education provided  including warning signs for which to seek follow up care

## 2012-10-23 ENCOUNTER — Other Ambulatory Visit: Payer: Self-pay | Admitting: *Deleted

## 2012-10-23 MED ORDER — SIMVASTATIN 20 MG PO TABS: 20.0000 mg | ORAL_TABLET | Freq: Every day | ORAL | Status: DC

## 2012-10-30 ENCOUNTER — Telehealth: Payer: Self-pay | Admitting: Internal Medicine

## 2012-10-30 MED ORDER — SIMVASTATIN 20 MG PO TABS: 20.0000 mg | ORAL_TABLET | Freq: Every day | ORAL | Status: DC

## 2012-10-30 NOTE — Telephone Encounter (Signed)
Pt stating Rf wasn't received at mail order. I resent Rf and will call to confirm they received.

## 2012-10-30 NOTE — Telephone Encounter (Signed)
The patient called the triage line hoping to get an rx of Simvastatin 20mg  sent to her pharmacy

## 2012-10-31 NOTE — Telephone Encounter (Signed)
I called pharmacy- they did not receive either of the eRxs that were sent. I gave Seward Grater and gave her the Rx verbally.  Pt informed.

## 2012-11-08 ENCOUNTER — Telehealth: Payer: Self-pay | Admitting: Internal Medicine

## 2012-11-08 MED ORDER — VALSARTAN 160 MG PO TABS: 160.0000 mg | ORAL_TABLET | Freq: Every day | ORAL | Status: DC

## 2012-11-08 NOTE — Telephone Encounter (Signed)
Rx sent to [facility]

## 2012-11-08 NOTE — Telephone Encounter (Signed)
Requesting Diovan 160 mg to be sent to [facility]

## 2012-11-11 ENCOUNTER — Telehealth: Payer: Self-pay | Admitting: *Deleted

## 2012-11-11 NOTE — Telephone Encounter (Signed)
Left msg on triage needing nurse to call diovan [facility]. They stated they still haven't received script. [facility] spoke with Dennis/pharmacist he stated did not received gave verbally for diovan. Notified pt rx call [facility]...Raechel Chute

## 2013-02-06 ENCOUNTER — Ambulatory Visit (INDEPENDENT_AMBULATORY_CARE_PROVIDER_SITE_OTHER): Payer: 59 | Admitting: Internal Medicine

## 2013-02-06 ENCOUNTER — Encounter: Payer: Self-pay | Admitting: Internal Medicine

## 2013-02-06 ENCOUNTER — Other Ambulatory Visit (INDEPENDENT_AMBULATORY_CARE_PROVIDER_SITE_OTHER): Payer: 59

## 2013-02-06 VITALS — BP 140/90 | HR 72 | Temp 98.1°F | Resp 16 | Ht 64.0 in | Wt 128.0 lb

## 2013-02-06 DIAGNOSIS — H6120 Impacted cerumen, unspecified ear: Secondary | ICD-10-CM

## 2013-02-06 DIAGNOSIS — E785 Hyperlipidemia, unspecified: Secondary | ICD-10-CM

## 2013-02-06 DIAGNOSIS — Z0000 Encounter for general adult medical examination without abnormal findings: Secondary | ICD-10-CM

## 2013-02-06 DIAGNOSIS — L821 Other seborrheic keratosis: Secondary | ICD-10-CM

## 2013-02-06 DIAGNOSIS — I10 Essential (primary) hypertension: Secondary | ICD-10-CM

## 2013-02-06 DIAGNOSIS — H6123 Impacted cerumen, bilateral: Secondary | ICD-10-CM

## 2013-02-06 LAB — HEPATIC FUNCTION PANEL
AST: 21 U/L (ref 0–37)
Albumin: 4.4 g/dL (ref 3.5–5.2)
Alkaline Phosphatase: 44 U/L (ref 39–117)
Total Bilirubin: 0.6 mg/dL (ref 0.3–1.2)

## 2013-02-06 LAB — CBC WITH DIFFERENTIAL/PLATELET
Basophils Absolute: 0 10*3/uL (ref 0.0–0.1)
Hemoglobin: 13.5 g/dL (ref 12.0–15.0)
Lymphocytes Relative: 33.6 % (ref 12.0–46.0)
Monocytes Relative: 8.6 % (ref 3.0–12.0)
Neutro Abs: 2.7 10*3/uL (ref 1.4–7.7)
RDW: 13.1 % (ref 11.5–14.6)
WBC: 5.4 10*3/uL (ref 4.5–10.5)

## 2013-02-06 LAB — URINALYSIS
Ketones, ur: NEGATIVE
Specific Gravity, Urine: 1.015 (ref 1.000–1.030)
Urine Glucose: NEGATIVE
pH: 7 (ref 5.0–8.0)

## 2013-02-06 LAB — BASIC METABOLIC PANEL
Calcium: 9.2 mg/dL (ref 8.4–10.5)
GFR: 96.24 mL/min (ref >60.00)
Glucose, Bld: 100 mg/dL — ABNORMAL HIGH (ref 70–99)
Sodium: 137 mEq/L (ref 135–145)

## 2013-02-06 NOTE — Assessment & Plan Note (Signed)
Multiple - trunk Dr Donzetta Starch

## 2013-02-06 NOTE — Assessment & Plan Note (Addendum)
Here for medicare wellness/physical  Diet: heart healthy  Physical activity: not sedentary  Depression/mood screen: negative  Hearing: intact to whispered voice  Visual acuity: grossly normal, performs annual eye exam  ADLs: capable  Fall risk: none  Home safety: good  Cognitive evaluation: intact to orientation, naming, recall and repetition  EOL planning: adv directives, full code/ I agree  I have personally reviewed and have noted  1. The patient's medical and social history  2. Their use of alcohol, tobacco or illicit drugs  3. Their current medications and supplements  4. The patient's functional ability including ADL's, fall risks, home safety risks and hearing or visual impairment.  5. Diet and physical activities  6. Evidence for depression or mood disorders no    Today patient counseled on age appropriate routine health concerns for screening and prevention, each reviewed and up to date or declined. Immunizations reviewed and up to date or declined. Labs ordered and reviewed. Risk factors for depression reviewed and negative. Hearing function and visual acuity are intact. ADLs screened and addressed as needed. Functional ability and level of safety reviewed and appropriate. Education, counseling and referrals performed based on assessed risks today. Patient provided with a copy of personalized plan for preventive services.   Dr Ileana Roup - pending Zosavax advised See Dr Hazle Quant

## 2013-02-06 NOTE — Assessment & Plan Note (Signed)
She will irrigate at home 

## 2013-02-06 NOTE — Progress Notes (Signed)
   Subjective:     HPI The patient is here for a wellness exam.  The patient presents for a follow-up of  chronic hypertension, chronic dyslipidemia controlled with medicines. C/o R arm pain at times  Wt Readings from Last 3 Encounters:  02/06/13 128 lb (58.06 kg)  07/09/12 126 lb 4 oz (57.267 kg)  06/06/12 128 lb (58.06 kg)   BP Readings from Last 3 Encounters:  02/06/13 140/90  07/09/12 140/90  06/06/12 130/80      Review of Systems  Constitutional: Negative for fever, chills, diaphoresis, activity change, appetite change, fatigue and unexpected weight change.  HENT: Negative for hearing loss, ear pain, congestion, sore throat, sneezing, mouth sores, neck pain, dental problem, voice change, postnasal drip and sinus pressure.   Eyes: Negative for pain and visual disturbance.  Respiratory: Negative for cough, chest tightness, wheezing and stridor.   Cardiovascular: Negative for chest pain, palpitations and leg swelling.  Gastrointestinal: Positive for abdominal pain. Negative for nausea, vomiting, blood in stool, abdominal distention and rectal pain.  Genitourinary: Negative for dysuria, frequency, hematuria, decreased urine volume, vaginal bleeding, vaginal discharge, difficulty urinating, vaginal pain and menstrual problem.  Musculoskeletal: Negative for back pain, joint swelling and gait problem.  Skin: Negative for color change, pallor, rash and wound.  Neurological: Negative for dizziness, tremors, syncope, speech difficulty, weakness, light-headedness, numbness and headaches.  Hematological: Negative for adenopathy.  Psychiatric/Behavioral: Negative for suicidal ideas, hallucinations, behavioral problems, confusion, sleep disturbance, dysphoric mood and decreased concentration. The patient is not hyperactive.        Objective:   Physical Exam  Constitutional: She appears well-developed and well-nourished. No distress.  HENT:  Head: Normocephalic.  Right Ear: External  ear normal.  Left Ear: External ear normal.  Nose: Nose normal.  Mouth/Throat: Oropharynx is clear and moist.  Eyes: Conjunctivae are normal. Pupils are equal, round, and reactive to light. Right eye exhibits no discharge. Left eye exhibits no discharge.  Neck: Normal range of motion. Neck supple. No JVD present. No tracheal deviation present. No thyromegaly present.  Cardiovascular: Normal rate, regular rhythm and normal heart sounds.   Pulmonary/Chest: No stridor. No respiratory distress. She has no wheezes. She exhibits no tenderness.  Abdominal: Soft. Bowel sounds are normal. She exhibits no distension and no mass. There is tenderness (LLQ was a little sensitive to palpation). There is no rebound and no guarding.  Musculoskeletal: She exhibits no edema and no tenderness.  Lymphadenopathy:    She has no cervical adenopathy.  Neurological: She displays normal reflexes. No cranial nerve deficit. She exhibits normal muscle tone. Coordination normal.  Skin: No rash noted. No erythema.  Psychiatric: She has a normal mood and affect. Her behavior is normal. Judgment and thought content normal.   Lab Results  Component Value Date   WBC 5.3 02/05/2012   HGB 13.1 02/05/2012   HCT 38.8 02/05/2012   PLT 206.0 02/05/2012   GLUCOSE 94 02/05/2012   CHOL 226* 02/05/2012   TRIG 54.0 02/05/2012   HDL 106.50 02/05/2012   LDLDIRECT 109.2 02/05/2012   ALT 20 02/05/2012   AST 25 02/05/2012   NA 140 02/05/2012   K 4.8 02/05/2012   CL 103 02/05/2012   CREATININE 0.6 02/05/2012   BUN 17 02/05/2012   CO2 29 02/05/2012   TSH 3.46 02/05/2012          Assessment & Plan:

## 2013-02-06 NOTE — Assessment & Plan Note (Signed)
Continue with current prescription therapy as reflected on the Med list.  

## 2013-02-11 ENCOUNTER — Telehealth: Payer: Self-pay | Admitting: Internal Medicine

## 2013-02-11 NOTE — Telephone Encounter (Signed)
Pt has reviewed her lab results from her physical on my chart.  She wants to know why the lab didn't do the lipids.  When are they to be drawn?  She is aware she may not here from Dr. Posey Rea until he returns next week.

## 2013-02-12 ENCOUNTER — Ambulatory Visit: Payer: 59

## 2013-02-13 ENCOUNTER — Ambulatory Visit (INDEPENDENT_AMBULATORY_CARE_PROVIDER_SITE_OTHER): Payer: 59 | Admitting: *Deleted

## 2013-02-13 DIAGNOSIS — Z2911 Encounter for prophylactic immunotherapy for respiratory syncytial virus (RSV): Secondary | ICD-10-CM

## 2013-02-13 DIAGNOSIS — Z23 Encounter for immunization: Secondary | ICD-10-CM

## 2013-02-14 ENCOUNTER — Telehealth: Payer: Self-pay

## 2013-02-14 NOTE — Telephone Encounter (Signed)
Pt called stating she had shingles vaccination yesterday and site is not mildly swollen, red, and warm. Pt advised that per VIS 1 in 3 persons experience this reaction at injection site. Pt advised to continue monitoring area and call back if there is no improvement or worsening of sxs.

## 2013-02-18 NOTE — Telephone Encounter (Signed)
It was ordered on 4/24. Why wasn't it done? Thx

## 2013-02-18 NOTE — Telephone Encounter (Signed)
It looks like the expected date is for 06/08/2013. I believe the lab has a protocol that they do not draw labs if the "expected date is far out". I informed pt of this. She is ok with waiting until next visit to have lipids checked.

## 2013-02-28 ENCOUNTER — Encounter: Payer: Self-pay | Admitting: Gynecology

## 2013-02-28 ENCOUNTER — Ambulatory Visit (INDEPENDENT_AMBULATORY_CARE_PROVIDER_SITE_OTHER): Payer: 59 | Admitting: Gynecology

## 2013-02-28 VITALS — BP 124/76 | Ht 64.0 in | Wt 130.0 lb

## 2013-02-28 DIAGNOSIS — M8580 Other specified disorders of bone density and structure, unspecified site: Secondary | ICD-10-CM

## 2013-02-28 DIAGNOSIS — M899 Disorder of bone, unspecified: Secondary | ICD-10-CM

## 2013-02-28 DIAGNOSIS — Z79890 Hormone replacement therapy (postmenopausal): Secondary | ICD-10-CM

## 2013-02-28 DIAGNOSIS — N952 Postmenopausal atrophic vaginitis: Secondary | ICD-10-CM

## 2013-02-28 MED ORDER — ESTRADIOL 0.0375 MG/24HR TD PTTW: 1.0000 | MEDICATED_PATCH | TRANSDERMAL | Status: DC

## 2013-02-28 NOTE — Patient Instructions (Signed)
Schedule colonoscopy this year. Wean off of estrogen as we discussed. Call me if you have any issues or questions with this. Followup in one year for annual exam.

## 2013-02-28 NOTE — Progress Notes (Signed)
Brittany Mathis November 09, 1945 [id number]        67 y.o.  [name] new patient for followup exam.  Former patient of Dr. Nicholas Lose. Several issues noted below.  Past medical history,surgical history, medications, allergies, family history and social history were all reviewed and documented in the EPIC chart. ROS:  Was performed and pertinent positives and negatives are included in the history.  Exam: Kim assistant Filed Vitals:   02/28/13 1057  BP: 124/76  Height: 5\' 4"  (1.626 m)  Weight: 130 lb (58.968 kg)   General appearance  Normal Skin grossly normal with multiple classic seborrheic keratoses covering chest back and lower abdomen Head/Neck normal with no cervical or supraclavicular adenopathy thyroid normal Lungs  clear Cardiac RR, without RMG Abdominal  soft, nontender, without masses, organomegaly or hernia Breasts  examined lying and sitting without masses, retractions, discharge or axillary adenopathy. Pelvic  Ext/BUS/vagina  normal with mild atrophic changes  Adnexa  Without masses or tenderness    Anus and perineum  normal   Rectovaginal  normal sphincter tone without palpated masses or tenderness.    Assessment/Plan:  67 y.o. [name] new patient   1. HRT status post TAH/BSO for endometriosis/menorrhagia. Patient on Vivelle 0.0375 patches. Has been on for years since her hysterectomy. I reviewed the whole issue of HRT to include the WHI study with increased risk of stroke heart attack DVT and breast cancer. The ACOG and NAMS statements for the lowest dose for the shortest period of time were reviewed. Transdermal first pass effect also discussed. After lengthy discussion she is going to try to wean and see how she does off of HRT. She has unacceptable side effects she will call me and I will represcribe her patches for the year having understood and accepted the risks. 2. Mammography this week. Continue with annual mammography. SBE monthly reviewed. 3. Pap smear 2012. No Pap smear done  today. I reviewed current screening guidelines. She is status post hysterectomy for benign indications and over the age of 38. We both agree with stop screening and she is comfortable with this. 4. DEXA 2013 through Dr. Johnn Hai office with reported osteopenia. I do not have a copy of this. He did not recommend treatment. I recommend repeat DEXA next year a 2 year interval. Increase calcium vitamin D reviewed. All copy of this report also to review. 5. Colonoscopy 2009 with recommended repeat in 5 years. Patient is to schedule this year. 6. Health maintenance. No lab work done as it is all done through her primary physician's office. Followup one year, sooner as needed.   Dara Lords MD, 12:06 PM 02/28/2013

## 2013-03-04 ENCOUNTER — Encounter: Payer: Self-pay | Admitting: Gynecology

## 2013-03-11 ENCOUNTER — Encounter: Payer: Self-pay | Admitting: Gynecology

## 2013-03-19 ENCOUNTER — Encounter: Payer: Self-pay | Admitting: Internal Medicine

## 2013-06-17 ENCOUNTER — Other Ambulatory Visit: Payer: Self-pay | Admitting: Internal Medicine

## 2013-09-22 ENCOUNTER — Telehealth: Payer: Self-pay | Admitting: *Deleted

## 2013-09-22 MED ORDER — ESTRADIOL 0.0375 MG/24HR TD PTTW: 1.0000 | MEDICATED_PATCH | TRANSDERMAL | Status: DC

## 2013-09-22 NOTE — Telephone Encounter (Signed)
Okay to refill her estrogen patches through next annual exam

## 2013-09-22 NOTE — Telephone Encounter (Signed)
Pt called to follow up OV 02/28/13 c/o menopausal symptoms per note" She has unacceptable side effects she will call me and I will represcribe her patches for the year having understood and accepted the risks". Please advise

## 2013-09-22 NOTE — Telephone Encounter (Signed)
rx sent, pt informed.  

## 2013-09-24 ENCOUNTER — Encounter: Payer: Self-pay | Admitting: Internal Medicine

## 2013-09-24 ENCOUNTER — Ambulatory Visit (INDEPENDENT_AMBULATORY_CARE_PROVIDER_SITE_OTHER): Payer: PRIVATE HEALTH INSURANCE | Admitting: Internal Medicine

## 2013-09-24 VITALS — BP 140/80 | HR 80 | Temp 97.1°F | Resp 16 | Ht 64.0 in | Wt 122.0 lb

## 2013-09-24 DIAGNOSIS — I10 Essential (primary) hypertension: Secondary | ICD-10-CM

## 2013-09-24 DIAGNOSIS — Z23 Encounter for immunization: Secondary | ICD-10-CM

## 2013-09-24 DIAGNOSIS — E785 Hyperlipidemia, unspecified: Secondary | ICD-10-CM

## 2013-09-24 NOTE — Assessment & Plan Note (Signed)
Continue with current prescription therapy as reflected on the Med list.  

## 2013-09-24 NOTE — Progress Notes (Signed)
Pre visit review using our clinic review tool, if applicable. No additional management support is needed unless otherwise documented below in the visit note. 

## 2013-11-17 ENCOUNTER — Other Ambulatory Visit: Payer: Self-pay

## 2013-11-17 NOTE — Telephone Encounter (Signed)
The patient is hoping to get her Diovan rx refilled through [facility]

## 2013-11-18 NOTE — Telephone Encounter (Signed)
Please advise refill? 

## 2013-11-20 MED ORDER — VALSARTAN 160 MG PO TABS: 160.0000 mg | ORAL_TABLET | Freq: Every day | ORAL | Status: DC

## 2013-11-20 NOTE — Telephone Encounter (Signed)
Returned patient's phone calls and left message on voicemail that we have no diovan 160 mg samples.

## 2013-11-20 NOTE — Telephone Encounter (Signed)
Pt called again.  She has two pills left.  Could she get samples to last until the rx arrives?

## 2013-11-21 ENCOUNTER — Telehealth: Payer: Self-pay | Admitting: *Deleted

## 2013-11-21 MED ORDER — VALSARTAN 160 MG PO TABS: 160.0000 mg | ORAL_TABLET | Freq: Every day | ORAL | Status: DC

## 2013-11-21 NOTE — Telephone Encounter (Signed)
Patient phoned stating she still had not received mail order delivery and only has one diovan pill remaining (we have no samples).  Confirmed that pharmacy confirmed receipt 11/20/13 @ 0905.  Emergent supply of 14 days was sent to local pharmacy while on phone speaking with patient.

## 2013-12-10 ENCOUNTER — Other Ambulatory Visit: Payer: Self-pay | Admitting: *Deleted

## 2013-12-10 ENCOUNTER — Telehealth: Payer: Self-pay | Admitting: Internal Medicine

## 2013-12-10 MED ORDER — VALSARTAN 160 MG PO TABS: 160.0000 mg | ORAL_TABLET | Freq: Every day | ORAL | Status: DC

## 2013-12-10 NOTE — Telephone Encounter (Signed)
Patient's husband is calling on her behalf in regards to her Diovan rx. He states that the mail order company has not yet delivered her medication and she is out. He is wanting to know if we have any samples that they can come pick up. Please advise.

## 2013-12-10 NOTE — Telephone Encounter (Signed)
Returned patient's phone call and delivery man had already found patient's delivery.

## 2014-02-10 ENCOUNTER — Encounter: Payer: Self-pay | Admitting: Internal Medicine

## 2014-02-10 ENCOUNTER — Other Ambulatory Visit (INDEPENDENT_AMBULATORY_CARE_PROVIDER_SITE_OTHER): Payer: PRIVATE HEALTH INSURANCE

## 2014-02-10 ENCOUNTER — Ambulatory Visit (INDEPENDENT_AMBULATORY_CARE_PROVIDER_SITE_OTHER): Payer: 59 | Admitting: Internal Medicine

## 2014-02-10 VITALS — BP 136/80 | HR 62 | Temp 97.0°F | Ht 64.0 in | Wt 125.0 lb

## 2014-02-10 DIAGNOSIS — H6120 Impacted cerumen, unspecified ear: Secondary | ICD-10-CM

## 2014-02-10 DIAGNOSIS — I10 Essential (primary) hypertension: Secondary | ICD-10-CM

## 2014-02-10 DIAGNOSIS — E785 Hyperlipidemia, unspecified: Secondary | ICD-10-CM

## 2014-02-10 DIAGNOSIS — Z0000 Encounter for general adult medical examination without abnormal findings: Secondary | ICD-10-CM

## 2014-02-10 DIAGNOSIS — R3129 Other microscopic hematuria: Secondary | ICD-10-CM

## 2014-02-10 LAB — CBC WITH DIFFERENTIAL/PLATELET
Basophils Absolute: 0.1 10*3/uL (ref 0.0–0.1)
Basophils Relative: 0.9 % (ref 0.0–3.0)
EOS PCT: 5.8 % — AB (ref 0.0–5.0)
Eosinophils Absolute: 0.4 10*3/uL (ref 0.0–0.7)
HEMATOCRIT: 41.5 % (ref 36.0–46.0)
HEMOGLOBIN: 13.9 g/dL (ref 12.0–15.0)
LYMPHS ABS: 2.3 10*3/uL (ref 0.7–4.0)
Lymphocytes Relative: 35.1 % (ref 12.0–46.0)
MCHC: 33.5 g/dL (ref 30.0–36.0)
MCV: 88.4 fl (ref 78.0–100.0)
MONO ABS: 0.5 10*3/uL (ref 0.1–1.0)
Monocytes Relative: 7.8 % (ref 3.0–12.0)
NEUTROS ABS: 3.3 10*3/uL (ref 1.4–7.7)
Neutrophils Relative %: 50.4 % (ref 43.0–77.0)
PLATELETS: 258 10*3/uL (ref 150.0–400.0)
RBC: 4.7 Mil/uL (ref 3.87–5.11)
RDW: 12.6 % (ref 11.5–14.6)
WBC: 6.6 10*3/uL (ref 4.5–10.5)

## 2014-02-10 LAB — BASIC METABOLIC PANEL
BUN: 17 mg/dL (ref 6–23)
CO2: 29 mEq/L (ref 19–32)
Calcium: 9.8 mg/dL (ref 8.4–10.5)
Chloride: 103 mEq/L (ref 96–112)
Creatinine, Ser: 0.6 mg/dL (ref 0.4–1.2)
GFR: 109.44 mL/min (ref >60.00)
Glucose, Bld: 98 mg/dL (ref 70–99)
Potassium: 5.3 mEq/L — ABNORMAL HIGH (ref 3.5–5.1)
Sodium: 139 mEq/L (ref 135–145)

## 2014-02-10 LAB — URINALYSIS
BILIRUBIN URINE: NEGATIVE
HGB URINE DIPSTICK: NEGATIVE
Ketones, ur: NEGATIVE
LEUKOCYTES UA: NEGATIVE
Nitrite: NEGATIVE
Specific Gravity, Urine: 1.015 (ref 1.000–1.030)
Total Protein, Urine: NEGATIVE
Urine Glucose: NEGATIVE
Urobilinogen, UA: 0.2 (ref 0.0–1.0)
pH: 7.5 (ref 5.0–8.0)

## 2014-02-10 LAB — LIPID PANEL
CHOLESTEROL: 218 mg/dL — AB (ref 0–200)
HDL: 99.6 mg/dL (ref >39.00)
LDL Cholesterol: 110 mg/dL — ABNORMAL HIGH (ref 0–99)
TRIGLYCERIDES: 42 mg/dL (ref 0.0–149.0)
Total CHOL/HDL Ratio: 2
VLDL: 8.4 mg/dL (ref 0.0–40.0)

## 2014-02-10 LAB — HEPATIC FUNCTION PANEL
ALBUMIN: 4.4 g/dL (ref 3.5–5.2)
ALT: 20 U/L (ref 0–35)
AST: 25 U/L (ref 0–37)
Alkaline Phosphatase: 40 U/L (ref 39–117)
Bilirubin, Direct: 0 mg/dL (ref 0.0–0.3)
TOTAL PROTEIN: 7.3 g/dL (ref 6.0–8.3)
Total Bilirubin: 0.4 mg/dL (ref 0.3–1.2)

## 2014-02-10 LAB — TSH: TSH: 1.6 u[IU]/mL (ref 0.35–5.50)

## 2014-02-10 MED ORDER — SIMVASTATIN 20 MG PO TABS: ORAL_TABLET | ORAL | Status: DC

## 2014-02-10 MED ORDER — VALSARTAN 160 MG PO TABS: 160.0000 mg | ORAL_TABLET | Freq: Every day | ORAL | Status: DC

## 2014-02-10 NOTE — Assessment & Plan Note (Signed)
Sh will irrigate at home

## 2014-02-10 NOTE — Assessment & Plan Note (Signed)
Continue with current prescription therapy as reflected on the Med list. Labs  

## 2014-02-10 NOTE — Assessment & Plan Note (Signed)
Here for medicare wellness/physical  Diet: heart healthy  Physical activity: not sedentary  Depression/mood screen: negative  Hearing: intact to whispered voice  Visual acuity: grossly normal, performs annual eye exam  ADLs: capable  Fall risk: none  Home safety: good  Cognitive evaluation: intact to orientation, naming, recall and repetition  EOL planning: adv directives, full code/ I agree  I have personally reviewed and have noted  1. The patient's medical and social history  2. Their use of alcohol, tobacco or illicit drugs  3. Their current medications and supplements  4. The patient's functional ability including ADL's, fall risks, home safety risks and hearing or visual impairment.  5. Diet and physical activities  6. Evidence for depression or mood disorders no    Today patient counseled on age appropriate routine health concerns for screening and prevention, each reviewed and up to date or declined. Immunizations reviewed and up to date or declined. Labs ordered and reviewed. Risk factors for depression reviewed and negative. Hearing function and visual acuity are intact. ADLs screened and addressed as needed. Functional ability and level of safety reviewed and appropriate. Education, counseling and referrals performed based on assessed risks today. Patient provided with a copy of personalized plan for preventive services.   Dr Ileana RoupFontaine Mammo - pending Eye exam Dr Hazle Quantigby

## 2014-02-10 NOTE — Progress Notes (Signed)
Pre visit review using our clinic review tool, if applicable. No additional management support is needed unless otherwise documented below in the visit note. 

## 2014-02-10 NOTE — Progress Notes (Signed)
   Subjective:     HPI The patient is here for a wellness exam.  The patient presents for a follow-up of  chronic hypertension, chronic dyslipidemia controlled with medicines.   Wt Readings from Last 3 Encounters:  02/10/14 125 lb (56.7 kg)  09/24/13 122 lb (55.339 kg)  02/28/13 130 lb (58.968 kg)   BP Readings from Last 3 Encounters:  02/10/14 136/80  09/24/13 140/80  02/28/13 124/76      Review of Systems  Constitutional: Negative for fever, chills, diaphoresis, activity change, appetite change, fatigue and unexpected weight change.  HENT: Negative for congestion, dental problem, ear pain, hearing loss, mouth sores, postnasal drip, sinus pressure, sneezing, sore throat and voice change.   Eyes: Negative for pain and visual disturbance.  Respiratory: Negative for cough, chest tightness, wheezing and stridor.   Cardiovascular: Negative for chest pain, palpitations and leg swelling.  Gastrointestinal: Positive for abdominal pain. Negative for nausea, vomiting, blood in stool, abdominal distention and rectal pain.  Genitourinary: Negative for dysuria, frequency, hematuria, decreased urine volume, vaginal bleeding, vaginal discharge, difficulty urinating, vaginal pain and menstrual problem.  Musculoskeletal: Negative for back pain, gait problem, joint swelling and neck pain.  Skin: Negative for color change, pallor, rash and wound.  Neurological: Negative for dizziness, tremors, syncope, speech difficulty, weakness, light-headedness, numbness and headaches.  Hematological: Negative for adenopathy.  Psychiatric/Behavioral: Negative for suicidal ideas, hallucinations, behavioral problems, confusion, sleep disturbance, dysphoric mood and decreased concentration. The patient is not hyperactive.        Objective:   Physical Exam  Constitutional: She appears well-developed and well-nourished. No distress.  HENT:  Head: Normocephalic.  Right Ear: External ear normal.  Left Ear:  External ear normal.  Nose: Nose normal.  Mouth/Throat: Oropharynx is clear and moist.  Eyes: Conjunctivae are normal. Pupils are equal, round, and reactive to light. Right eye exhibits no discharge. Left eye exhibits no discharge.  Neck: Normal range of motion. Neck supple. No JVD present. No tracheal deviation present. No thyromegaly present.  Cardiovascular: Normal rate, regular rhythm and normal heart sounds.   Pulmonary/Chest: No stridor. No respiratory distress. She has no wheezes. She exhibits no tenderness.  Abdominal: Soft. Bowel sounds are normal. She exhibits no distension and no mass. There is no tenderness. There is no rebound and no guarding.  Musculoskeletal: She exhibits no edema and no tenderness.  Lymphadenopathy:    She has no cervical adenopathy.  Neurological: She displays normal reflexes. No cranial nerve deficit. She exhibits normal muscle tone. Coordination normal.  Skin: No rash noted. No erythema.  Psychiatric: She has a normal mood and affect. Her behavior is normal. Judgment and thought content normal.  wax B Lab Results  Component Value Date   WBC 5.4 02/06/2013   HGB 13.5 02/06/2013   HCT 39.6 02/06/2013   PLT 228.0 02/06/2013   GLUCOSE 100* 02/06/2013   CHOL 226* 02/05/2012   TRIG 54.0 02/05/2012   HDL 106.50 02/05/2012   LDLDIRECT 109.2 02/05/2012   ALT 18 02/06/2013   AST 21 02/06/2013   NA 137 02/06/2013   K 4.6 02/06/2013   CL 100 02/06/2013   CREATININE 0.7 02/06/2013   BUN 17 02/06/2013   CO2 31 02/06/2013   TSH 1.81 02/06/2013          Assessment & Plan:

## 2014-02-10 NOTE — Assessment & Plan Note (Signed)
UA

## 2014-02-11 ENCOUNTER — Telehealth: Payer: Self-pay | Admitting: Internal Medicine

## 2014-02-11 NOTE — Telephone Encounter (Signed)
Relevant patient education assigned to patient using Emmi. ° °

## 2014-02-11 NOTE — Addendum Note (Signed)
Addended by: Tresa GarterPLOTNIKOV, ALEKSEI V on: 02/11/2014 12:00 AM   Modules accepted: Level of Service

## 2014-03-10 ENCOUNTER — Encounter: Payer: Self-pay | Admitting: Gynecology

## 2014-03-10 ENCOUNTER — Ambulatory Visit (INDEPENDENT_AMBULATORY_CARE_PROVIDER_SITE_OTHER): Payer: Medicare Other | Admitting: Gynecology

## 2014-03-10 VITALS — BP 114/70 | Ht 64.0 in | Wt 123.0 lb

## 2014-03-10 DIAGNOSIS — N952 Postmenopausal atrophic vaginitis: Secondary | ICD-10-CM

## 2014-03-10 DIAGNOSIS — M899 Disorder of bone, unspecified: Secondary | ICD-10-CM

## 2014-03-10 DIAGNOSIS — M949 Disorder of cartilage, unspecified: Secondary | ICD-10-CM

## 2014-03-10 DIAGNOSIS — M8580 Other specified disorders of bone density and structure, unspecified site: Secondary | ICD-10-CM

## 2014-03-10 DIAGNOSIS — Z79890 Hormone replacement therapy (postmenopausal): Secondary | ICD-10-CM

## 2014-03-10 MED ORDER — ESTRADIOL 0.0375 MG/24HR TD PTTW: 1.0000 | MEDICATED_PATCH | TRANSDERMAL | Status: DC

## 2014-03-10 NOTE — Progress Notes (Addendum)
Brittany Mathis [birth year] [id number]        68 y.o.  [name] for followup exam.  Several issues noted below.  Past medical history,surgical history, problem list, medications, allergies, family history and social history were all reviewed and documented as reviewed in the EPIC chart.  ROS:  12 system ROS performed with pertinent positives and negatives included in the history, assessment and plan.  Included Systems: General, HEENT, Neck, Cardiovascular, Pulmonary, Gastrointestinal, Genitourinary, Musculoskeletal, Dermatologic, Endocrine, Hematological, Neurologic, Psychiatric Additional significant findings :  Right breast soreness as discussed below   Exam: Kim assistant Filed Vitals:   03/10/14 1036  BP: 114/70  Height: 5\' 4"  (1.626 m)  Weight: 123 lb (55.792 kg)   General appearance:  Normal affect, orientation and appearance. Skin: Grossly normal with a number of benign appearing seborrheic keratoses chest and back HEENT: Without gross lesions.  No cervical or supraclavicular adenopathy. Thyroid normal.  Lungs:  Clear without wheezing, rales or rhonchi Cardiac: RR, without RMG Abdominal:  Soft, nontender, without masses, guarding, rebound, organomegaly or hernia Breasts:  Examined lying and sitting without masses, retractions, discharge or axillary adenopathy. Pelvic:  Ext/BUS/vagina with generalized atrophic changes  Adnexa  Without masses or tenderness    Anus and perineum  Normal   Rectovaginal  Normal sphincter tone without palpated masses or tenderness.    Assessment/Plan:  68 y.o. G106P1001 female for followup.   1. HRT status post TAH/BSO for endometriosis/menorrhagia. Had been on Vivelle 0.0375 patches. We discussed the risks/benefits last year as noted in my 02/28/2013 note. Patient tried stopping but had unacceptable hot flushes. Now uses one patch every 5 days and seems to do well with this. I again reviewed the risks of HRT to include stroke, heart attack, DVT. Possible  increased risk of breast cancer. ACOG and NAMS statements for lowest dose for shortest period of time. Benefits of transdermal. Continuing into the 60s and 70s with possible increased risks include stroke all reviewed. Patient wants to continue, understands and accepts the risks and I refilled her x1 year. 2. Mammography 02/2014. Notes some mild right breast tenderness following the mammogram. Had none before. Exam is normal. Recommend observation at present. If discomfort persists she'll represent for evaluation. If clears then we'll follow expectantly. Continue with annual mammography. SBE monthly reviewed. 3. Pap smear 2012. No Pap smear done today. No history of significant abnormal Pap smears. History of hysterectomy for benign indications. Over the age of 64. We both agree on stop screening per current screening guidelines. 4. Osteopenia. DEXA 02/2012 T score -1.9. Repeat DEXA now at 2 year interval. Check vitamin D level. Increased vitamin D/calcium recommendations reviewed. 5. Colonoscopy 2009. Repeat at their recommended interval. 6. Health maintenance. Note blood work done other than vitamin D. as patient reports this all done through Dr [employer] office. Followup for DEXA/vitamin D, otherwise annually.   Note: This document was prepared with digital dictation and possible smart phrase technology. Any transcriptional errors that result from this process are unintentional.   Dara Lords MD, 11:12 AM 03/10/2014

## 2014-03-10 NOTE — Patient Instructions (Addendum)
Followup for vitamin D results and bone density as scheduled. Followup in one year for annual exam.  Estradiol skin patches What is this medicine? ESTRADIOL (es tra DYE ole) skin patches contain an estrogen. It is mostly used as hormone replacement in menopausal women. It helps to treat hot flashes and prevent osteoporosis. It is also used to treat women with low estrogen levels or those who have had their ovaries removed. This medicine may be used for other purposes; ask your health care provider or pharmacist if you have questions. COMMON BRAND NAME(S): Alora, Climara, Esclim, Estraderm, FemPatch, Menostar, Minivelle, Vivelle, Vivelle-Dot What should I tell my health care provider before I take this medicine? They need to know if you have any of these conditions: -abnormal vaginal bleeding -blood vessel disease or blood clots -breast, cervical, endometrial, ovarian, liver, or uterine cancer -dementia -diabetes -gallbladder disease -heart disease or recent heart attack -high blood pressure -high cholesterol -high level of calcium in the blood -hysterectomy -kidney disease -liver disease -migraine headaches -protein C deficiency -protein S deficiency -stroke -systemic lupus erythematosus (SLE) -tobacco smoker -an unusual or allergic reaction to estrogens, other hormones, medicines, foods, dyes, or preservatives -pregnant or trying to get pregnant -breast-feeding How should I use this medicine? This medicine is for external use only. Follow the directions on the prescription label. Tear open the pouch, do not use scissors. Remove the stiff protective liner covering the adhesive. Try not to touch the adhesive. Apply the patch, sticky side to the skin, to an area that is clean, dry and hairless. Avoid injured, irritated, calloused, or scarred areas. Do not apply the skin patches to your breasts or around the waistline. Use a different site each time to prevent skin irritation. Do not  cut or trim the patch. Do not stop using except on the advice of your doctor or health care professional. Do not wear more than one patch at a time unless you are told to do so by your doctor or health care professional. Contact your pediatrician regarding the use of this medicine in children. Special care may be needed. A patient package insert for the product will be given with each prescription and refill. Read this sheet carefully each time. The sheet may change frequently. Overdosage: If you think you have taken too much of this medicine contact a poison control center or emergency room at once. NOTE: This medicine is only for you. Do not share this medicine with others. What if I miss a dose? If you miss a dose, apply it as soon as you can. If it is almost time for your next dose, apply only that dose. Do not apply double or extra doses. What may interact with this medicine? Do not take this medicine with any of the following medications: -aromatase inhibitors like aminoglutethimide, anastrozole, exemestane, letrozole, testolactone This medicine may also interact with the following medications: -carbamazepine -certain antibiotics used to treat infections -certain barbiturates used for inducing sleep or treating seizures -grapefruit juice -medicines for fungus infections like itraconazole and ketoconazole -raloxifene or tamoxifen -rifabutin, rifampin, or rifapentine -ritonavir -St. John's Wort This list may not describe all possible interactions. Give your health care provider a list of all the medicines, herbs, non-prescription drugs, or dietary supplements you use. Also tell them if you smoke, drink alcohol, or use illegal drugs. Some items may interact with your medicine. What should I watch for while using this medicine? Visit your doctor or health care professional for regular checks on your progress. You  will need a regular breast and pelvic exam and Pap smear while on this medicine.  You should also discuss the need for regular mammograms with your health care professional, and follow his or her guidelines for these tests. This medicine can make your body retain fluid, making your fingers, hands, or ankles swell. Your blood pressure can go up. Contact your doctor or health care professional if you feel you are retaining fluid. If you have any reason to think you are pregnant, stop taking this medicine right away and contact your doctor or health care professional. Smoking increases the risk of getting a blood clot or having a stroke while you are taking this medicine, especially if you are more than 70 years old. You are strongly advised not to smoke. If you wear contact lenses and notice visual changes, or if the lenses begin to feel uncomfortable, consult your eye doctor or health care professional. This medicine can increase the risk of developing a condition (endometrial hyperplasia) that may lead to cancer of the lining of the uterus. Taking progestins, another hormone drug, with this medicine lowers the risk of developing this condition. Therefore, if your uterus has not been removed (by a hysterectomy), your doctor may prescribe a progestin for you to take together with your estrogen. You should know, however, that taking estrogens with progestins may have additional health risks. You should discuss the use of estrogens and progestins with your health care professional to determine the benefits and risks for you. If you are going to have surgery or an MRI, you may need to stop taking this medicine. Consult your health care professional for advice before you schedule the surgery. You may bathe or participate in other activities while wearing your patch. If the patch pulls loose or falls off, you may reapply it if the patch is sticky enough to stay on the skin. You should reapply the patch in a different area. Use a fresh patch if it will no longer stick. What side effects may I  notice from receiving this medicine? Side effects that you should report to your doctor or health care professional as soon as possible: -allergic reactions like skin rash, itching or hives, swelling of the face, lips, or tongue -breast tissue changes or discharge -changes in vision -chest pain -confusion, trouble speaking or understanding -dark urine -general ill feeling or flu-like symptoms -light-colored stools -nausea, vomiting -pain, swelling, warmth in the leg -right upper belly pain -severe headaches -shortness of breath -sudden numbness or weakness of the face, arm or leg -trouble walking, dizziness, loss of balance or coordination -unusual vaginal bleeding -yellowing of the eyes or skin Side effects that usually do not require medical attention (report to your doctor or health care professional if they continue or are bothersome): -hair loss -increased hunger or thirst -increased urination -symptoms of vaginal infection like itching, irritation or unusual discharge -unusually weak or tired This list may not describe all possible side effects. Call your doctor for medical advice about side effects. You may report side effects to FDA at 1-800-FDA-1088. Where should I keep my medicine? Keep out of the reach of children. Store at room temperature below 30 degrees C (86 degrees F). Do not store any patches that have been removed from their protective pouch. Throw away any unused medicine after the expiration date. Dispose of used patches properly. Since used patches may still contain active hormones, fold the patch in half so that it sticks to itself prior to disposal. NOTE: This  sheet is a summary. It may not cover all possible information. If you have questions about this medicine, talk to your doctor, pharmacist, or health care provider.  2014, [employer]. (2011-01-04 09:19:41)   You may obtain a copy of any labs that were done today by logging onto MyChart as  outlined in the instructions provided with your AVS (after visit summary). The office will not call with normal lab results but certainly if there are any significant abnormalities then we will contact you.   Health Maintenance, Female A healthy lifestyle and preventative care can promote health and wellness.  Maintain regular health, dental, and eye exams.  Eat a healthy diet. Foods like vegetables, fruits, whole grains, low-fat dairy products, and lean protein foods contain the nutrients you need without too many calories. Decrease your intake of foods high in solid fats, added sugars, and salt. Get information about a proper diet from your caregiver, if necessary.  Regular physical exercise is one of the most important things you can do for your health. Most adults should get at least 150 minutes of moderate-intensity exercise (any activity that increases your heart rate and causes you to sweat) each week. In addition, most adults need muscle-strengthening exercises on 2 or more days a week.   Maintain a healthy weight. The body mass index (BMI) is a screening tool to identify possible weight problems. It provides an estimate of body fat based on height and weight. Your caregiver can help determine your BMI, and can help you achieve or maintain a healthy weight. For adults 20 years and older:  A BMI below 18.5 is considered underweight.  A BMI of 18.5 to 24.9 is normal.  A BMI of 25 to 29.9 is considered overweight.  A BMI of 30 and above is considered obese.  Maintain normal blood lipids and cholesterol by exercising and minimizing your intake of saturated fat. Eat a balanced diet with plenty of fruits and vegetables. Blood tests for lipids and cholesterol should begin at age 29 and be repeated every 5 years. If your lipid or cholesterol levels are high, you are over 50, or you are a high risk for heart disease, you may need your cholesterol levels checked more frequently.Ongoing high  lipid and cholesterol levels should be treated with medicines if diet and exercise are not effective.  If you smoke, find out from your caregiver how to quit. If you do not use tobacco, do not start.  Lung cancer screening is recommended for adults aged 75 80 years who are at high risk for developing lung cancer because of a history of smoking. Yearly low-dose computed tomography (CT) is recommended for people who have at least a 30-pack-year history of smoking and are a current smoker or have quit within the past 15 years. A pack year of smoking is smoking an average of 1 pack of cigarettes a day for 1 year (for example: 1 pack a day for 30 years or 2 packs a day for 15 years). Yearly screening should continue until the smoker has stopped smoking for at least 15 years. Yearly screening should also be stopped for people who develop a health problem that would prevent them from having lung cancer treatment.  If you are pregnant, do not drink alcohol. If you are breastfeeding, be very cautious about drinking alcohol. If you are not pregnant and choose to drink alcohol, do not exceed 1 drink per day. One drink is considered to be 12 ounces (355 mL) of beer,  5 ounces (148 mL) of wine, or 1.5 ounces (44 mL) of liquor.  Avoid use of street drugs. Do not share needles with anyone. Ask for help if you need support or instructions about stopping the use of drugs.  High blood pressure causes heart disease and increases the risk of stroke. Blood pressure should be checked at least every 1 to 2 years. Ongoing high blood pressure should be treated with medicines, if weight loss and exercise are not effective.  If you are 55 to 70 years old, ask your caregiver if you should take aspirin to prevent strokes.  Diabetes screening involves taking a blood sample to check your fasting blood sugar level. This should be done once every 3 years, after age 19, if you are within normal weight and without risk factors for  diabetes. Testing should be considered at a younger age or be carried out more frequently if you are overweight and have at least 1 risk factor for diabetes.  Breast cancer screening is essential preventative care for women. You should practice "breast self-awareness." This means understanding the normal appearance and feel of your breasts and may include breast self-examination. Any changes detected, no matter how small, should be reported to a caregiver. Women in their 70s and 30s should have a clinical breast exam (CBE) by a caregiver as part of a regular health exam every 1 to 3 years. After age 88, women should have a CBE every year. Starting at age 58, women should consider having a mammogram (breast X-ray) every year. Women who have a family history of breast cancer should talk to their caregiver about genetic screening. Women at a high risk of breast cancer should talk to their caregiver about having an MRI and a mammogram every year.  Breast cancer gene (BRCA)-related cancer risk assessment is recommended for women who have family members with BRCA-related cancers. BRCA-related cancers include breast, ovarian, tubal, and peritoneal cancers. Having family members with these cancers may be associated with an increased risk for harmful changes (mutations) in the breast cancer genes BRCA1 and BRCA2. Results of the assessment will determine the need for genetic counseling and BRCA1 and BRCA2 testing.  The Pap test is a screening test for cervical cancer. Women should have a Pap test starting at age 50. Between ages 47 and 59, Pap tests should be repeated every 2 years. Beginning at age 45, you should have a Pap test every 3 years as long as the past 3 Pap tests have been normal. If you had a hysterectomy for a problem that was not cancer or a condition that could lead to cancer, then you no longer need Pap tests. If you are between ages 51 and 92, and you have had normal Pap tests going back 10 years, you  no longer need Pap tests. If you have had past treatment for cervical cancer or a condition that could lead to cancer, you need Pap tests and screening for cancer for at least 20 years after your treatment. If Pap tests have been discontinued, risk factors (such as a new sexual partner) need to be reassessed to determine if screening should be resumed. Some women have medical problems that increase the chance of getting cervical cancer. In these cases, your caregiver may recommend more frequent screening and Pap tests.  The human papillomavirus (HPV) test is an additional test that may be used for cervical cancer screening. The HPV test looks for the virus that can cause the cell changes on the  cervix. The cells collected during the Pap test can be tested for HPV. The HPV test could be used to screen women aged 95 years and older, and should be used in women of any age who have unclear Pap test results. After the age of 35, women should have HPV testing at the same frequency as a Pap test.  Colorectal cancer can be detected and often prevented. Most routine colorectal cancer screening begins at the age of 37 and continues through age 12. However, your caregiver may recommend screening at an earlier age if you have risk factors for colon cancer. On a yearly basis, your caregiver may provide home test kits to check for hidden blood in the stool. Use of a small camera at the end of a tube, to directly examine the colon (sigmoidoscopy or colonoscopy), can detect the earliest forms of colorectal cancer. Talk to your caregiver about this at age 55, when routine screening begins. Direct examination of the colon should be repeated every 5 to 10 years through age 60, unless early forms of pre-cancerous polyps or small growths are found.  Hepatitis C blood testing is recommended for all people born from [birth year] through 1967 and any individual with known risks for hepatitis C.  Practice safe sex. Use condoms and avoid  high-risk sexual practices to reduce the spread of sexually transmitted infections (STIs). Sexually active women aged 63 and younger should be checked for Chlamydia, which is a common sexually transmitted infection. Older women with new or multiple partners should also be tested for Chlamydia. Testing for other STIs is recommended if you are sexually active and at increased risk.  Osteoporosis is a disease in which the bones lose minerals and strength with aging. This can result in serious bone fractures. The risk of osteoporosis can be identified using a bone density scan. Women ages 80 and over and women at risk for fractures or osteoporosis should discuss screening with their caregivers. Ask your caregiver whether you should be taking a calcium supplement or vitamin D to reduce the rate of osteoporosis.  Menopause can be associated with physical symptoms and risks. Hormone replacement therapy is available to decrease symptoms and risks. You should talk to your caregiver about whether hormone replacement therapy is right for you.  Use sunscreen. Apply sunscreen liberally and repeatedly throughout the day. You should seek shade when your shadow is shorter than you. Protect yourself by wearing long sleeves, pants, a wide-brimmed hat, and sunglasses year round, whenever you are outdoors.  Notify your caregiver of new moles or changes in moles, especially if there is a change in shape or color. Also notify your caregiver if a mole is larger than the size of a pencil eraser.  Stay current with your immunizations. Document Released: 04/17/2011 Document Revised: 01/27/2013 Document Reviewed: 04/17/2011 [facility] Patient Information 2014 [employer].

## 2014-03-11 LAB — URINALYSIS W MICROSCOPIC + REFLEX CULTURE
Bilirubin Urine: NEGATIVE
CASTS: NONE SEEN
Crystals: NONE SEEN
Glucose, UA: NEGATIVE mg/dL
HGB URINE DIPSTICK: NEGATIVE
KETONES UR: NEGATIVE mg/dL
Leukocytes, UA: NEGATIVE
Nitrite: NEGATIVE
Protein, ur: NEGATIVE mg/dL
Specific Gravity, Urine: 1.006 (ref 1.005–1.030)
Squamous Epithelial / LPF: NONE SEEN
UROBILINOGEN UA: 0.2 mg/dL (ref 0.0–1.0)
pH: 7.5 (ref 5.0–8.0)

## 2014-03-11 LAB — VITAMIN D 25 HYDROXY (VIT D DEFICIENCY, FRACTURES): Vit D, 25-Hydroxy: 46 ng/mL (ref 30–89)

## 2014-03-17 ENCOUNTER — Telehealth: Payer: Self-pay | Admitting: *Deleted

## 2014-03-17 NOTE — Telephone Encounter (Signed)
[facility] faxed form for PA for estradiol 0.0375 mg, will wait for response.

## 2014-03-18 NOTE — Telephone Encounter (Signed)
APPROVED THROUGH 03/18/2015.

## 2014-03-24 ENCOUNTER — Encounter: Payer: Self-pay | Admitting: Internal Medicine

## 2014-04-07 ENCOUNTER — Encounter: Payer: Self-pay | Admitting: Gynecology

## 2014-04-07 ENCOUNTER — Ambulatory Visit (INDEPENDENT_AMBULATORY_CARE_PROVIDER_SITE_OTHER): Payer: Medicare Other

## 2014-04-07 DIAGNOSIS — M899 Disorder of bone, unspecified: Secondary | ICD-10-CM

## 2014-04-07 DIAGNOSIS — M8580 Other specified disorders of bone density and structure, unspecified site: Secondary | ICD-10-CM

## 2014-04-07 DIAGNOSIS — M949 Disorder of cartilage, unspecified: Secondary | ICD-10-CM

## 2014-08-17 ENCOUNTER — Encounter: Payer: Self-pay | Admitting: Gynecology

## 2014-09-04 ENCOUNTER — Encounter: Payer: Self-pay | Admitting: Internal Medicine

## 2014-09-04 ENCOUNTER — Ambulatory Visit (INDEPENDENT_AMBULATORY_CARE_PROVIDER_SITE_OTHER): Payer: PRIVATE HEALTH INSURANCE | Admitting: Internal Medicine

## 2014-09-04 VITALS — BP 140/72 | HR 77 | Temp 98.0°F | Wt 126.0 lb

## 2014-09-04 DIAGNOSIS — J069 Acute upper respiratory infection, unspecified: Secondary | ICD-10-CM

## 2014-09-04 MED ORDER — PROMETHAZINE-CODEINE 6.25-10 MG/5ML PO SYRP: 5.0000 mL | ORAL_SOLUTION | ORAL | Status: DC | PRN

## 2014-09-04 MED ORDER — AZITHROMYCIN 250 MG PO TABS: ORAL_TABLET | ORAL | Status: DC

## 2014-09-04 NOTE — Progress Notes (Signed)
Pre visit review using our clinic review tool, if applicable. No additional management support is needed unless otherwise documented below in the visit note. 

## 2014-09-04 NOTE — Progress Notes (Signed)
Subjective:     Cough This is a new problem. The current episode started in the past 7 days. The problem has been gradually improving. The problem occurs every few minutes. The cough is productive of sputum. Associated symptoms include a fever, postnasal drip and a sore throat. Pertinent negatives include no chest pain, chills, ear pain, headaches, rash or wheezing. Nothing aggravates the symptoms. The treatment provided no relief.     Wt Readings from Last 3 Encounters:  09/04/14 126 lb (57.153 kg)  03/10/14 123 lb (55.792 kg)  02/10/14 125 lb (56.7 kg)   BP Readings from Last 3 Encounters:  09/04/14 140/72  03/10/14 114/70  02/10/14 136/80      Review of Systems  Constitutional: Positive for fever. Negative for chills, diaphoresis, activity change, appetite change, fatigue and unexpected weight change.  HENT: Positive for postnasal drip and sore throat. Negative for congestion, dental problem, ear pain, hearing loss, mouth sores, sinus pressure, sneezing and voice change.   Eyes: Negative for pain and visual disturbance.  Respiratory: Negative for cough, chest tightness, wheezing and stridor.   Cardiovascular: Negative for chest pain, palpitations and leg swelling.  Gastrointestinal: Positive for abdominal pain. Negative for nausea, vomiting, blood in stool, abdominal distention and rectal pain.  Genitourinary: Negative for dysuria, frequency, hematuria, decreased urine volume, vaginal bleeding, vaginal discharge, difficulty urinating, vaginal pain and menstrual problem.  Musculoskeletal: Negative for back pain, joint swelling, gait problem and neck pain.  Skin: Negative for color change, pallor, rash and wound.  Neurological: Negative for dizziness, tremors, syncope, speech difficulty, weakness, light-headedness, numbness and headaches.  Hematological: Negative for adenopathy.  Psychiatric/Behavioral: Negative for suicidal ideas, hallucinations, behavioral problems, confusion,  sleep disturbance, dysphoric mood and decreased concentration. The patient is not hyperactive.        Objective:   Physical Exam  Constitutional: She appears well-developed. No distress.  HENT:  Head: Normocephalic.  Right Ear: External ear normal.  Left Ear: External ear normal.  Nose: Nose normal.  Mouth/Throat: Oropharynx is clear and moist.  Eyes: Conjunctivae are normal. Pupils are equal, round, and reactive to light. Right eye exhibits no discharge. Left eye exhibits no discharge.  Neck: Normal range of motion. Neck supple. No JVD present. No tracheal deviation present. No thyromegaly present.  Cardiovascular: Normal rate, regular rhythm and normal heart sounds.   Pulmonary/Chest: No stridor. No respiratory distress. She has no wheezes.  Abdominal: Soft. Bowel sounds are normal. She exhibits no distension and no mass. There is no tenderness. There is no rebound and no guarding.  Musculoskeletal: She exhibits no edema or tenderness.  Lymphadenopathy:    She has no cervical adenopathy.  Neurological: She displays normal reflexes. No cranial nerve deficit. She exhibits normal muscle tone. Coordination normal.  Skin: No rash noted. No erythema.  Psychiatric: She has a normal mood and affect. Her behavior is normal. Judgment and thought content normal.  Eryth throat  Lab Results  Component Value Date   WBC 6.6 02/10/2014   HGB 13.9 02/10/2014   HCT 41.5 02/10/2014   PLT 258.0 02/10/2014   GLUCOSE 98 02/10/2014   CHOL 218* 02/10/2014   TRIG 42.0 02/10/2014   HDL 99.60 02/10/2014   LDLDIRECT 109.2 02/05/2012   LDLCALC 110* 02/10/2014   ALT 20 02/10/2014   AST 25 02/10/2014   NA 139 02/10/2014   K 5.3* 02/10/2014   CL 103 02/10/2014   CREATININE 0.6 02/10/2014   BUN 17 02/10/2014   CO2 29 02/10/2014  TSH 1.60 02/10/2014          Assessment & Plan:

## 2014-09-04 NOTE — Assessment & Plan Note (Signed)
Prom cod Z pac 

## 2014-10-18 ENCOUNTER — Emergency Department (HOSPITAL_COMMUNITY)
Admission: EM | Admit: 2014-10-18 | Discharge: 2014-10-18 | Disposition: A | Discharge status: Home / Self Care | Payer: PRIVATE HEALTH INSURANCE | Attending: Emergency Medicine | Admitting: Emergency Medicine

## 2014-10-18 ENCOUNTER — Emergency Department (HOSPITAL_COMMUNITY): Payer: PRIVATE HEALTH INSURANCE

## 2014-10-18 ENCOUNTER — Encounter (HOSPITAL_COMMUNITY): Payer: Self-pay | Admitting: Emergency Medicine

## 2014-10-18 DIAGNOSIS — N200 Calculus of kidney: Secondary | ICD-10-CM

## 2014-10-18 DIAGNOSIS — R110 Nausea: Secondary | ICD-10-CM | POA: Diagnosis not present

## 2014-10-18 DIAGNOSIS — E785 Hyperlipidemia, unspecified: Secondary | ICD-10-CM | POA: Insufficient documentation

## 2014-10-18 DIAGNOSIS — Z8739 Personal history of other diseases of the musculoskeletal system and connective tissue: Secondary | ICD-10-CM | POA: Diagnosis not present

## 2014-10-18 DIAGNOSIS — R209 Unspecified disturbances of skin sensation: Secondary | ICD-10-CM

## 2014-10-18 DIAGNOSIS — R202 Paresthesia of skin: Secondary | ICD-10-CM | POA: Diagnosis not present

## 2014-10-18 DIAGNOSIS — J3489 Other specified disorders of nose and nasal sinuses: Secondary | ICD-10-CM | POA: Insufficient documentation

## 2014-10-18 DIAGNOSIS — Z792 Long term (current) use of antibiotics: Secondary | ICD-10-CM | POA: Diagnosis not present

## 2014-10-18 DIAGNOSIS — Z90710 Acquired absence of both cervix and uterus: Secondary | ICD-10-CM | POA: Diagnosis not present

## 2014-10-18 DIAGNOSIS — R109 Unspecified abdominal pain: Secondary | ICD-10-CM

## 2014-10-18 DIAGNOSIS — Z8619 Personal history of other infectious and parasitic diseases: Secondary | ICD-10-CM | POA: Diagnosis not present

## 2014-10-18 DIAGNOSIS — I10 Essential (primary) hypertension: Secondary | ICD-10-CM | POA: Insufficient documentation

## 2014-10-18 DIAGNOSIS — Z79899 Other long term (current) drug therapy: Secondary | ICD-10-CM | POA: Insufficient documentation

## 2014-10-18 DIAGNOSIS — R300 Dysuria: Secondary | ICD-10-CM

## 2014-10-18 LAB — CBC WITH DIFFERENTIAL/PLATELET
BASOS PCT: 1 % (ref 0–1)
Basophils Absolute: 0 10*3/uL (ref 0.0–0.1)
EOS ABS: 0.2 10*3/uL (ref 0.0–0.7)
Eosinophils Relative: 3 % (ref 0–5)
HEMATOCRIT: 39.5 % (ref 36.0–46.0)
HEMOGLOBIN: 13.2 g/dL (ref 12.0–15.0)
Lymphocytes Relative: 29 % (ref 12–46)
Lymphs Abs: 1.7 10*3/uL (ref 0.7–4.0)
MCH: 29.1 pg (ref 26.0–34.0)
MCHC: 33.4 g/dL (ref 30.0–36.0)
MCV: 87.2 fL (ref 78.0–100.0)
MONO ABS: 0.5 10*3/uL (ref 0.1–1.0)
MONOS PCT: 8 % (ref 3–12)
Neutro Abs: 3.6 10*3/uL (ref 1.7–7.7)
Neutrophils Relative %: 59 % (ref 43–77)
Platelets: 240 10*3/uL (ref 150–400)
RBC: 4.53 MIL/uL (ref 3.87–5.11)
RDW: 12 % (ref 11.5–15.5)
WBC: 6 10*3/uL (ref 4.0–10.5)

## 2014-10-18 LAB — COMPREHENSIVE METABOLIC PANEL
ALBUMIN: 4 g/dL (ref 3.5–5.2)
ALT: 18 U/L (ref 0–35)
AST: 27 U/L (ref 0–37)
Alkaline Phosphatase: 41 U/L (ref 39–117)
Anion gap: 9 (ref 5–15)
BUN: 9 mg/dL (ref 6–23)
CO2: 26 mmol/L (ref 19–32)
Calcium: 9.9 mg/dL (ref 8.4–10.5)
Chloride: 104 mEq/L (ref 96–112)
Creatinine, Ser: 0.77 mg/dL (ref 0.50–1.10)
GFR calc non Af Amer: 83 mL/min — ABNORMAL LOW (ref >90)
Glucose, Bld: 109 mg/dL — ABNORMAL HIGH (ref 70–99)
Potassium: 4.6 mmol/L (ref 3.5–5.1)
Sodium: 139 mmol/L (ref 135–145)
TOTAL PROTEIN: 6.9 g/dL (ref 6.0–8.3)
Total Bilirubin: 0.5 mg/dL (ref 0.3–1.2)

## 2014-10-18 LAB — URINALYSIS, ROUTINE W REFLEX MICROSCOPIC
Bilirubin Urine: NEGATIVE
Glucose, UA: NEGATIVE mg/dL
Hgb urine dipstick: NEGATIVE
Ketones, ur: 40 mg/dL — AB
Leukocytes, UA: NEGATIVE
NITRITE: NEGATIVE
PROTEIN: NEGATIVE mg/dL
SPECIFIC GRAVITY, URINE: 1.018 (ref 1.005–1.030)
Urobilinogen, UA: 0.2 mg/dL (ref 0.0–1.0)
pH: 6.5 (ref 5.0–8.0)

## 2014-10-18 LAB — LIPASE, BLOOD: LIPASE: 31 U/L (ref 11–59)

## 2014-10-18 MED ORDER — HYDROCODONE-ACETAMINOPHEN 5-325 MG PO TABS: 1.0000 | ORAL_TABLET | Freq: Four times a day (QID) | ORAL | Status: DC | PRN

## 2014-10-18 MED ORDER — ONDANSETRON HCL 4 MG/2ML IJ SOLN
4.0000 mg | Freq: Once | INTRAMUSCULAR | Status: AC
  Administered 2014-10-18: 4 mg via INTRAVENOUS
  Filled 2014-10-18: qty 2

## 2014-10-18 MED ORDER — SODIUM CHLORIDE 0.9 % IV BOLUS (SEPSIS)
500.0000 mL | Freq: Once | INTRAVENOUS | Status: AC
  Administered 2014-10-18: 500 mL via INTRAVENOUS

## 2014-10-18 MED ORDER — ONDANSETRON HCL 8 MG PO TABS: 8.0000 mg | ORAL_TABLET | Freq: Three times a day (TID) | ORAL | Status: DC | PRN

## 2014-10-18 MED ORDER — MORPHINE SULFATE 4 MG/ML IJ SOLN
4.0000 mg | Freq: Once | INTRAMUSCULAR | Status: AC
  Administered 2014-10-18: 4 mg via INTRAVENOUS
  Filled 2014-10-18: qty 1

## 2014-10-18 MED ORDER — NAPROXEN 500 MG PO TABS: 500.0000 mg | ORAL_TABLET | Freq: Two times a day (BID) | ORAL | Status: DC | PRN

## 2014-10-18 NOTE — ED Notes (Signed)
Patient transported to CT 

## 2014-10-18 NOTE — ED Notes (Signed)
Patient returned from CT

## 2014-10-18 NOTE — ED Provider Notes (Signed)
CSN: [id number]     Arrival date & time 10/18/14  1141 History   First MD Initiated Contact with Patient 10/18/14 1259     Chief Complaint  Patient presents with  . Flank Pain  . Abdominal Pain     (Consider location/radiation/quality/duration/timing/severity/associated sxs/prior Treatment) HPI Comments: Brittany Mathis is a 69 y.o. female with a PMHx of HLD, HTN, nephrolithiasis, thyroiditis, osteopenia, and remote hx of shingles, with a PSHx of BSO and abdominal hysterectomy, who presents to the ED with complaints of left flank pain and left lower quadrant abdominal pain associated with nausea that began last night, but increased this morning. She reports the pain is currently 1/10 and comes intermittently. She reports the pain feels like a sharp pressure located in her left lateral abdomen and radiating into her left flank, improved slightly with walking, and with no known aggravating factors. She states she has had 3 months of "bladder spasm" when she urinates, and that she was evaluated by a urologist at that time who noted that she did have bilateral nephrolithiasis. She noted dark malodorous urine and increased urinary freq/urgency over the last few days. Denies fevers, chills, CP, SOB, vomiting, diarrhea, constipation, obstipation, melena, hematochezia, hematuria, dysuria, vaginal bleeding/discharge, myalgias, arthralgias, focal weakness, numbness, or rashes. While in the lobby awaiting to be seen, she states she developed L facial "tingling", which she states has occurred before and resolved spontaneously. At this time, the symptom is improving but she still has some slight tingling. Denies facial asymmetry, numbness, weakness, lightheadedness, HA, vision changes, tinnitus, hearing loss, or focal neuro deficits. She states this symptom feels like "sinus pressure" since it's just behind her L eye, and she has some sinus congestion "every so often". Denies dental pain, rhinorrhea, or current URI  symptoms.  Patient is a 69 y.o. female presenting with flank pain and abdominal pain. The history is provided by the patient. No language interpreter was used.  Flank Pain This is a new problem. The current episode started yesterday. The problem occurs intermittently. The problem has been waxing and waning. Associated symptoms include abdominal pain, nausea and urinary symptoms. Pertinent negatives include no arthralgias, change in bowel habit, chest pain, chills, congestion, coughing, fever, headaches, myalgias, neck pain, numbness, rash, sore throat, vertigo, visual change, vomiting or weakness. Nothing aggravates the symptoms. She has tried walking for the symptoms. The treatment provided moderate relief.  Abdominal Pain Associated symptoms: nausea   Associated symptoms: no chest pain, no chills, no constipation, no cough, no diarrhea, no dysuria, no fever, no hematuria, no shortness of breath, no sore throat, no vaginal bleeding, no vaginal discharge and no vomiting     Past Medical History  Diagnosis Date  . Hyperlipidemia   . Hypertension   . Nephrolithiasis 1972  . Thyroid disease 2010    thyroiditis  . History of shingles   . Osteopenia 03/2014    T score -1.9 FRAX 10%/1.9% stable from prior DEXA 2013   Past Surgical History  Procedure Laterality Date  . Oophorectomy      BSO  . Abdominal hysterectomy  1983    menorrhagia, endometriosiscomplete   Family History  Problem Relation Age of Onset  . Hypertension Mother   . Hyperlipidemia Mother   . Hypertension Father   . Hyperlipidemia Father   . Glaucoma Father   . Kidney disease Father   . Colon cancer Maternal Aunt    History  Substance Use Topics  . Smoking status: Never Smoker   .  Smokeless tobacco: Not on file     Comment: experimented in college  . Alcohol Use: 7.0 oz/week    14 drink(s) per week   OB History    Gravida Para Term Preterm AB TAB SAB Ectopic Multiple Living   Review of  Systems  Constitutional: Negative for fever and chills.  HENT: Positive for sinus pressure. Negative for congestion, dental problem, ear discharge, ear pain, facial swelling, rhinorrhea, sore throat, tinnitus and trouble swallowing.   Eyes: Negative for pain, discharge, redness and visual disturbance.  Respiratory: Negative for cough and shortness of breath.   Cardiovascular: Negative for chest pain.  Gastrointestinal: Positive for nausea and abdominal pain. Negative for vomiting, diarrhea, constipation, blood in stool and change in bowel habit.  Genitourinary: Positive for urgency, frequency and flank pain. Negative for dysuria, hematuria, vaginal bleeding and vaginal discharge.  Musculoskeletal: Negative for myalgias, arthralgias and neck pain.  Skin: Negative for rash.  Neurological: Negative for dizziness, vertigo, tremors, syncope, facial asymmetry, speech difficulty, weakness, light-headedness, numbness and headaches.       +L facial tingling  Psychiatric/Behavioral: Negative for confusion.   10 Systems reviewed and are negative for acute change except as noted in the HPI.    Allergies  Review of patient's allergies indicates no known allergies.  Home Medications   Prior to Admission medications   Medication Sig Start Date End Date Taking? Authorizing Provider  azithromycin (ZITHROMAX) 250 MG tablet As directed 09/04/14   Jacinta Shoe V, MD  cholecalciferol (VITAMIN D) 1000 UNITS tablet Take 1,000 Units by mouth daily.      Historical Provider, MD  estradiol (VIVELLE-DOT) 0.0375 MG/24HR Place 1 patch onto the skin 2 (two) times a week. 03/10/14   Dara Lords, MD  Multiple Vitamin (MULTIVITAMIN) tablet Take 2 tablets by mouth daily.     Historical Provider, MD  Omega-3 Fatty Acids (FISH OIL) 1000 MG CAPS Take 1 capsule by mouth daily.     Historical Provider, MD  promethazine-codeine (PHENERGAN WITH CODEINE) 6.25-10 MG/5ML syrup Take 5 mLs by mouth every 4 (four) hours as  needed. 09/04/14   Aleksei Plotnikov V, MD  simvastatin (ZOCOR) 20 MG tablet Take 1 tablet by mouth  every night at bedtime 02/10/14   Aleksei Plotnikov V, MD  valsartan (DIOVAN) 160 MG tablet Take 1 tablet (160 mg total) by mouth daily. 02/10/14 02/10/15  Aleksei Plotnikov V, MD   BP 140/81 mmHg  Pulse 71  Temp(Src) 97.6 F (36.4 C) (Oral)  Resp 18  SpO2 100% Physical Exam  Constitutional: She is oriented to person, place, and time. Vital signs are normal. She appears well-developed and well-nourished.  Non-toxic appearance. No distress.  Afebrile, nontoxic, NAD  HENT:  Head: Normocephalic and atraumatic.  Nose: Right sinus exhibits no maxillary sinus tenderness and no frontal sinus tenderness. Left sinus exhibits maxillary sinus tenderness and frontal sinus tenderness.  Mouth/Throat: Uvula is midline, oropharynx is clear and moist and mucous membranes are normal. No trismus in the jaw. No uvula swelling.  [place]/AT, no facial asymmetry Nose clear Mild frontal and maxillary sinus TTP on L side Oropharynx clear, symmetric tongue protrusion, uvula midline  Eyes: Conjunctivae and EOM are normal. Pupils are equal, round, and reactive to light. Right eye exhibits no discharge. Left eye exhibits no discharge.  PERRL, EOMI  Neck: Normal range of motion. Neck supple. No JVD present. Carotid bruit is not present.  FROM  intact without spinous process or paraspinous muscle TTP, no bony stepoffs or deformities, no muscle spasms. No rigidity or meningeal signs. No bruising or swelling. No carotid bruit or JVD  Cardiovascular: Normal rate, regular rhythm, normal heart sounds and intact distal pulses.  Exam reveals no gallop and no friction rub.   No murmur heard. RRR, nl s1/s2, no m/r/g, distal pulses intact  Pulmonary/Chest: Effort normal and breath sounds normal. No respiratory distress. She has no decreased breath sounds. She has no wheezes. She has no rhonchi. She has no rales.  Abdominal: Soft. Normal  appearance and bowel sounds are normal. She exhibits no distension. There is tenderness in the left lower quadrant. There is CVA tenderness (L sided). There is no rigidity, no rebound, no guarding, no tenderness at McBurney's point and negative Murphy's sign.    Soft, ND, +BS throughout, with L lateral abdomen TTP tracking from LLQ over to the mid-axillary line and back to L flank, no r/g/r, neg murphy's, neg mcburney's, +mild L sided CVA TTP   Musculoskeletal: Normal range of motion.  MAE x4 Strength 5/5 in all extremities Sensation grossly intact in all extremities Gait steady  Neurological: She is alert and oriented to person, place, and time. She has normal strength and normal reflexes. No cranial nerve deficit or sensory deficit. She displays a negative Romberg sign. Coordination and gait normal. GCS eye subscore is 4. GCS verbal subscore is 5. GCS motor subscore is 6.  CN 2-12 grossly intact A&O x4 GCS 15 DTRs symmetric Sensation and strength intact Gait nonataxic including with tandem walking Coordination with finger-to-nose and heel-shin WNL Neg romberg, neg pronator drift   Skin: Skin is warm, dry and intact. No rash noted.  No rashes  Psychiatric: She has a normal mood and affect. Cognition and memory are normal.  Nursing note and vitals reviewed.   ED Course  Procedures (including critical care time) Labs Review Labs Reviewed  COMPREHENSIVE METABOLIC PANEL - Abnormal; Notable for the following:    Glucose, Bld 109 (*)    GFR calc non Af Amer 83 (*)    All other components within normal limits  URINALYSIS, ROUTINE W REFLEX MICROSCOPIC - Abnormal; Notable for the following:    Ketones, ur 40 (*)    All other components within normal limits  CBC WITH DIFFERENTIAL  LIPASE, BLOOD    Imaging Review Ct Head Wo Contrast  10/18/2014   CLINICAL DATA:  Left-sided abdominal pain and left-sided paresthesias.  EXAM: CT HEAD WITHOUT CONTRAST  TECHNIQUE: Contiguous axial images  were obtained from the base of the skull through the vertex without intravenous contrast.  COMPARISON:  07/09/2012  FINDINGS: Stable moderate small vessel disease in the periventricular [place] matter. The brain demonstrates no evidence of hemorrhage, infarction, edema, mass effect, extra-axial fluid collection, hydrocephalus or mass lesion. The skull is unremarkable.  IMPRESSION: Stable small vessel disease.  No acute findings.   Electronically Signed   By: Irish Lack M.D.   On: 10/18/2014 14:33   Ct Renal Stone Study  10/18/2014   CLINICAL DATA:  Initial encounter for left-sided pain that radiates around to the back off and on for about 1 month but became worse last night.  EXAM: CT ABDOMEN AND PELVIS WITHOUT CONTRAST  TECHNIQUE: Multidetector CT imaging of the abdomen and pelvis was performed following the standard protocol without IV contrast.  COMPARISON:  02/08/2012  FINDINGS: Lower chest: Stable 2 mm right lower lobe pulmonary nodule, consistent with benign process such as scarring.  There is some chronic atelectasis or linear scarring in the left base.  Hepatobiliary: No focal abnormality in the liver on this study without intravenous contrast. No evidence for hepatomegaly. There is no evidence for gallstones, gallbladder wall thickening, or pericholecystic fluid. No intrahepatic or extrahepatic biliary dilation.  Pancreas: No focal mass lesion. No dilatation of the main duct. No intraparenchymal cyst. No peripancreatic edema.  Spleen: No splenomegaly. No focal mass lesion.  Adrenals/Urinary Tract: No adrenal nodule or mass. 2 mm nonobstructing stones are seen in the upper pole and interpolar region of the right kidney. No evidence for right-sided hydroureteronephrosis. No right ureteral stone.  Mild left hydroureteronephrosis is evident. Multiple stones are seen in the left kidney ranging in size from a 2 mm stone in the upper pole to a 5 x 9 x 5 mm stone in the left renal pelvis. Perienteric edema is  seen down to the level of the pelvis. A 7 x 4 x 4 mm stone is identified at the left ureterovesical junction. The bladder is decompressed.  Stomach/Bowel: Stomach is nondistended. No gastric wall thickening. No evidence of outlet obstruction. The duodenum does not cross the midline and small bowel remains clustered in the right abdomen. The cecum is not positioned in the right lower quadrant but is seen in the midline central pelvis with most of the colon located in the left abdomen. No evidence for small bowel obstruction. No gross colonic abnormality is evident. The appendix is normal.  Vascular/Lymphatic: Atherosclerotic calcification is noted in the wall of the abdominal aorta without aneurysm. No lymphadenopathy in the abdomen. No pelvic sidewall lymphadenopathy.  Reproductive: Uterus is not clearly visualized and may be surgically absent. There is no adnexal mass.  Other: No intraperitoneal free fluid.  Musculoskeletal: Bone windows reveal no worrisome lytic or sclerotic osseous lesions.  IMPRESSION: Bilateral nephrolithiasis, left greater than right, with a 7 x 4 x 4 mm stone at the left ureterovesical junction causing mild left hydroureteronephrosis.  Intestinal malrotation.  No evidence for obstruction.   Electronically Signed   By: Kennith Center M.D.   On: 10/18/2014 14:39     EKG Interpretation None      MDM   Final diagnoses:  Left flank pain  Facial paresthesia  Nephrolithiasis  Dysuria    69 y.o. female  With L flank pain radiating to LLQ. Will obtain labs and urine, and CT renal study. Will give pain meds and antiemetics with small bolus fluids. Pt also with L facial tingling. Neurologically intact without focal deficits, no other concerning symptoms, but given this symptom will obtain CT head. DDx includes shingles prodrome since pt has hx of shingles. Dr. Gwendolyn Grant will see pt. Will reassess shortly.  3:23 PM  U/A with some ketones but otherwise WNL. CBC w/diff unremarkable. CMP  WNL. Lipase WNL. CT renal showing b/l nephrolithiasis, L>R, with 7 x 4 x 4mm stone at L UVJ with mild L hydroureteronephrosis. CT head WNL. Pain and nausea improved. Will have her f/up with urology for ongoing care of her nephrolithiasis. Discussed f/up with her PCP regarding facial paresthesia, but doubt TIA. I explained the diagnosis and have given explicit precautions to return to the ER including for any other new or worsening symptoms. The patient understands and accepts the medical plan as it's been dictated and I have answered their questions. Discharge instructions concerning home care and prescriptions have been given. The patient is STABLE and is discharged to home in good condition.  BP 154/77 mmHg  Pulse 64  Temp(Src) 97.6 F (36.4 C) (Oral)  Resp 17  SpO2 100%  Meds ordered this encounter  Medications  . morphine 4 MG/ML injection 4 mg    Sig:   . ondansetron (ZOFRAN) injection 4 mg    Sig:   . sodium chloride 0.9 % bolus 500 mL    Sig:       [place] [employer], PA-C 10/18/14 1539  Elwin Mocha, MD 10/18/14 [phone]

## 2014-10-18 NOTE — Discharge Instructions (Signed)
Take naprosyn as directed as needed for inflammation and pain using norco for breakthrough pain. Do not drive or operate machinery with pain medication use. May need over-the-counter stool softener with this pain medication use. Use Zofran as needed for nausea. Followup with urologist in the next 1 to 2 weeks for recheck of ongoing pain, however for intractable or uncontrollable pain at home then return to the emergency department. If your facial tingling gets worse or you develop weakness, facial droop, headaches, or any other concerning symptoms, return to the emergency department immediately. Otherwise see your regular doctor for ongoing evaluation.   Kidney Stones Kidney stones (urolithiasis) are solid masses that form inside your kidneys. The intense pain is caused by the stone moving through the kidney, ureter, bladder, and urethra (urinary tract). When the stone moves, the ureter starts to spasm around the stone. The stone is usually passed in your pee (urine).  HOME CARE  Drink enough fluids to keep your pee clear or pale yellow. This helps to get the stone out.  Strain all pee through the provided strainer. Do not pee without peeing through the strainer, not even once. If you pee the stone out, catch it in the strainer. The stone may be as small as a grain of salt. Take this to your doctor. This will help your doctor figure out what you can do to try to prevent more kidney stones.  Only take medicine as told by your doctor.  Follow up with your doctor as told.  Get follow-up X-rays as told by your doctor. GET HELP IF: You have pain that gets worse even if you have been taking pain medicine. GET HELP RIGHT AWAY IF:   Your pain does not get better with medicine.  You have a fever or shaking chills.  Your pain increases and gets worse over 18 hours.  You have new belly (abdominal) pain.  You feel faint or pass out.  You are unable to pee. MAKE SURE YOU:   Understand these  instructions.  Will watch your condition.  Will get help right away if you are not doing well or get worse. Document Released: 03/20/2008 Document Revised: 06/04/2013 Document Reviewed: 03/05/2013 [facility] Patient Information 2015 [place], [place]. This information is not intended to replace advice given to you by your health care provider. Make sure you discuss any questions you have with your health care provider.  Ureteral Colic Ureteral colic is spasm-like pain from the kidney or the ureter. This is often caused by a kidney stone. The pain is caused by the stone trying to get through the tubes that pass your pee. HOME CARE   Drink enough fluids to keep your pee (urine) clear or pale yellow.  Strain all your pee. A strainer will be provided. Keep anything caught in the strainer and bring it to your doctor. The stone causing the pain may be very small.  Only take medicine as told by your doctor.  Follow up with your doctor as told. GET HELP RIGHT AWAY IF:   Pain is not controlled with medicine.  Pain continues or gets worse.  The pain changes and there is chest or belly (abdominal) pain.  You pass out (faint).  You cannot pee.  You keep throwing up (vomiting).  You have a temperature by mouth above 102 F (38.9 C), not controlled by medicine. MAKE SURE YOU:   Understand these instructions.  Will watch this condition.  Will get help right away if you are not doing  well or get worse. Document Released: 03/20/2008 Document Revised: 12/25/2011 Document Reviewed: 03/20/2008 [facility] Patient Information 2015 [place], [place]. This information is not intended to replace advice given to you by your health care provider. Make sure you discuss any questions you have with your health care provider.  Paresthesia Paresthesia is an abnormal burning or prickling sensation. This sensation is generally felt in the hands, arms, legs, or feet. However, it may occur in any part of the body.  It is usually not painful. The feeling may be described as:  Tingling or numbness.  "Pins and needles."  Skin crawling.  Buzzing.  Limbs "falling asleep."  Itching. Most people experience temporary (transient) paresthesia at some time in their lives. CAUSES  Paresthesia may occur when you breathe too quickly (hyperventilation). It can also occur without any apparent cause. Commonly, paresthesia occurs when pressure is placed on a nerve. The feeling quickly goes away once the pressure is removed. For some people, however, paresthesia is a long-lasting (chronic) condition caused by an underlying disorder. The underlying disorder may be:  A traumatic, direct injury to nerves. Examples include a:  Broken (fractured) neck.  Fractured skull.  A disorder affecting the brain and spinal cord (central nervous system). Examples include:  Transverse myelitis.  Encephalitis.  Transient ischemic attack.  Multiple sclerosis.  Stroke.  Tumor or blood vessel problems, such as an arteriovenous malformation pressing against the brain or spinal cord.  A condition that damages the peripheral nerves (peripheral neuropathy). Peripheral nerves are not part of the brain and spinal cord. These conditions include:  Diabetes.  Peripheral vascular disease.  Nerve entrapment syndromes, such as carpal tunnel syndrome.  Shingles.  Hypothyroidism.  Vitamin B12 deficiencies.  Alcoholism.  Heavy metal poisoning (lead, arsenic).  Rheumatoid arthritis.  Systemic lupus erythematosus. DIAGNOSIS  Your caregiver will attempt to find the underlying cause of your paresthesia. Your caregiver may:  Take your medical history.  Perform a physical exam.  Order various lab tests.  Order imaging tests. TREATMENT  Treatment for paresthesia depends on the underlying cause. HOME CARE INSTRUCTIONS  Avoid drinking alcohol.  You may consider massage or acupuncture to help relieve your  symptoms.  Keep all follow-up appointments as directed by your caregiver. SEEK IMMEDIATE MEDICAL CARE IF:   You feel weak.  You have trouble walking or moving.  You have problems with speech or vision.  You feel confused.  You cannot control your bladder or bowel movements.  You feel numbness after an injury.  You faint.  Your burning or prickling feeling gets worse when walking.  You have pain, cramps, or dizziness.  You develop a rash. MAKE SURE YOU:  Understand these instructions.  Will watch your condition.  Will get help right away if you are not doing well or get worse. Document Released: 09/22/2002 Document Revised: 12/25/2011 Document Reviewed: 06/23/2011 [facility] Patient Information 2015 [place], [place]. This information is not intended to replace advice given to you by your health care provider. Make sure you discuss any questions you have with your health care provider.

## 2014-10-18 NOTE — ED Notes (Signed)
Pt c/o LLq pain into back and urgency to urinate x several days; pt sts some nausea; pt sts hx of kidney stones and unsure if same

## 2014-12-31 ENCOUNTER — Telehealth: Payer: Self-pay | Admitting: Internal Medicine

## 2015-01-01 ENCOUNTER — Telehealth: Payer: Self-pay

## 2015-01-01 NOTE — Telephone Encounter (Signed)
Left message for pt to call back if she still wants flu vaccine 

## 2015-02-12 ENCOUNTER — Encounter: Payer: Self-pay | Admitting: Internal Medicine

## 2015-02-12 ENCOUNTER — Ambulatory Visit (INDEPENDENT_AMBULATORY_CARE_PROVIDER_SITE_OTHER): Payer: Medicare Other | Admitting: Internal Medicine

## 2015-02-12 ENCOUNTER — Other Ambulatory Visit (INDEPENDENT_AMBULATORY_CARE_PROVIDER_SITE_OTHER): Payer: Medicare Other

## 2015-02-12 VITALS — BP 135/80 | HR 74 | Ht 64.0 in | Wt 112.0 lb

## 2015-02-12 DIAGNOSIS — I10 Essential (primary) hypertension: Secondary | ICD-10-CM

## 2015-02-12 DIAGNOSIS — Z0000 Encounter for general adult medical examination without abnormal findings: Secondary | ICD-10-CM | POA: Diagnosis not present

## 2015-02-12 DIAGNOSIS — E785 Hyperlipidemia, unspecified: Secondary | ICD-10-CM | POA: Diagnosis not present

## 2015-02-12 LAB — LIPID PANEL
CHOL/HDL RATIO: 2
Cholesterol: 228 mg/dL — ABNORMAL HIGH (ref 0–200)
HDL: 95.9 mg/dL (ref >39.00)
LDL Cholesterol: 119 mg/dL — ABNORMAL HIGH (ref 0–99)
NONHDL: 132.1
TRIGLYCERIDES: 65 mg/dL (ref 0.0–149.0)
VLDL: 13 mg/dL (ref 0.0–40.0)

## 2015-02-12 LAB — HEPATIC FUNCTION PANEL
ALK PHOS: 47 U/L (ref 39–117)
ALT: 14 U/L (ref 0–35)
AST: 19 U/L (ref 0–37)
Albumin: 4.2 g/dL (ref 3.5–5.2)
BILIRUBIN TOTAL: 0.6 mg/dL (ref 0.2–1.2)
Bilirubin, Direct: 0.1 mg/dL (ref 0.0–0.3)
TOTAL PROTEIN: 7 g/dL (ref 6.0–8.3)

## 2015-02-12 LAB — URINALYSIS
BILIRUBIN URINE: NEGATIVE
HGB URINE DIPSTICK: NEGATIVE
Leukocytes, UA: NEGATIVE
Nitrite: NEGATIVE
PH: 7 (ref 5.0–8.0)
Specific Gravity, Urine: 1.02 (ref 1.000–1.030)
Total Protein, Urine: NEGATIVE
UROBILINOGEN UA: 0.2 (ref 0.0–1.0)
Urine Glucose: NEGATIVE

## 2015-02-12 LAB — BASIC METABOLIC PANEL
BUN: 14 mg/dL (ref 6–23)
CHLORIDE: 98 meq/L (ref 96–112)
CO2: 29 mEq/L (ref 19–32)
CREATININE: 0.62 mg/dL (ref 0.40–1.20)
Calcium: 9.6 mg/dL (ref 8.4–10.5)
GFR: 101.04 mL/min (ref >60.00)
Glucose, Bld: 97 mg/dL (ref 70–99)
POTASSIUM: 4.5 meq/L (ref 3.5–5.1)
SODIUM: 134 meq/L — AB (ref 135–145)

## 2015-02-12 LAB — CBC WITH DIFFERENTIAL/PLATELET
Basophils Absolute: 0 10*3/uL (ref 0.0–0.1)
Basophils Relative: 0.8 % (ref 0.0–3.0)
EOS ABS: 0.4 10*3/uL (ref 0.0–0.7)
Eosinophils Relative: 7 % — ABNORMAL HIGH (ref 0.0–5.0)
HEMATOCRIT: 38.7 % (ref 36.0–46.0)
HEMOGLOBIN: 13.3 g/dL (ref 12.0–15.0)
Lymphocytes Relative: 33.5 % (ref 12.0–46.0)
Lymphs Abs: 1.8 10*3/uL (ref 0.7–4.0)
MCHC: 34.5 g/dL (ref 30.0–36.0)
MCV: 86.1 fl (ref 78.0–100.0)
Monocytes Absolute: 0.5 10*3/uL (ref 0.1–1.0)
Monocytes Relative: 9.2 % (ref 3.0–12.0)
Neutro Abs: 2.6 10*3/uL (ref 1.4–7.7)
Neutrophils Relative %: 49.5 % (ref 43.0–77.0)
Platelets: 241 10*3/uL (ref 150.0–400.0)
RBC: 4.49 Mil/uL (ref 3.87–5.11)
RDW: 13.4 % (ref 11.5–15.5)
WBC: 5.3 10*3/uL (ref 4.0–10.5)

## 2015-02-12 LAB — TSH: TSH: 1.55 u[IU]/mL (ref 0.35–4.50)

## 2015-02-12 MED ORDER — SIMVASTATIN 20 MG PO TABS: ORAL_TABLET | ORAL | Status: DC

## 2015-02-12 MED ORDER — VALSARTAN 160 MG PO TABS: 160.0000 mg | ORAL_TABLET | Freq: Every day | ORAL | Status: DC

## 2015-02-12 NOTE — Assessment & Plan Note (Signed)
Valsartan 

## 2015-02-12 NOTE — Assessment & Plan Note (Signed)
Here for medicare wellness/physical  Diet: heart healthy  Physical activity: not sedentary  Depression/mood screen: negative  Hearing: intact to whispered voice  Visual acuity: grossly normal, performs annual eye exam  ADLs: capable  Fall risk: none  Home safety: good  Cognitive evaluation: intact to orientation, naming, recall and repetition  EOL planning: adv directives, full code/ I agree  I have personally reviewed and have noted  1. The patient's medical, surgical and social history  2. Their use of alcohol, tobacco or illicit drugs  3. Their current medications and supplements  4. The patient's functional ability including ADL's, fall risks, home safety risks and hearing or visual impairment.  5. Diet and physical activities  6. Evidence for depression or mood disorders 7. The roster of all physicians providing medical care to patient - is listed in the Snapshot section of the chart and reviewed today.    Today patient counseled on age appropriate routine health concerns for screening and prevention, each reviewed and up to date or declined. Immunizations reviewed and up to date or declined. Labs ordered and reviewed. Risk factors for depression reviewed and negative. Hearing function and visual acuity are intact. ADLs screened and addressed as needed. Functional ability and level of safety reviewed and appropriate. Education, counseling and referrals performed based on assessed risks today. Patient provided with a copy of personalized plan for preventive services.      Dr Ileana RoupFontaine Mammo - pending Eye exam Dr Hazle Quantigby Colon Dr Kinnie ScalesMedoff Dewayne Hatch- [place] will call

## 2015-02-12 NOTE — Progress Notes (Signed)
   Subjective:     HPI  The patient is here for a wellness exam. The patient has been doing well overall without major physical or psychological issues going on lately.   The patient presents for a follow-up of  chronic hypertension, chronic dyslipidemia controlled with medicines. Lost wt due to stress  Wt Readings from Last 3 Encounters:  02/12/15 112 lb (50.803 kg)  09/04/14 126 lb (57.153 kg)  03/10/14 123 lb (55.792 kg)   BP Readings from Last 3 Encounters:  02/12/15 146/84  10/18/14 152/75  09/04/14 140/72      Review of Systems  Constitutional: Negative for fever, chills, diaphoresis, activity change, appetite change, fatigue and unexpected weight change.  HENT: Negative for congestion, dental problem, ear pain, hearing loss, mouth sores, postnasal drip, sinus pressure, sneezing, sore throat and voice change.   Eyes: Negative for pain and visual disturbance.  Respiratory: Negative for cough, chest tightness, wheezing and stridor.   Cardiovascular: Negative for chest pain, palpitations and leg swelling.  Gastrointestinal: Positive for abdominal pain. Negative for nausea, vomiting, blood in stool, abdominal distention and rectal pain.  Genitourinary: Negative for dysuria, frequency, hematuria, decreased urine volume, vaginal bleeding, vaginal discharge, difficulty urinating, vaginal pain and menstrual problem.  Musculoskeletal: Negative for back pain, joint swelling, gait problem and neck pain.  Skin: Negative for color change, pallor, rash and wound.  Neurological: Negative for dizziness, tremors, syncope, speech difficulty, weakness, light-headedness, numbness and headaches.  Hematological: Negative for adenopathy.  Psychiatric/Behavioral: Negative for suicidal ideas, hallucinations, behavioral problems, confusion, sleep disturbance, dysphoric mood and decreased concentration. The patient is not hyperactive.        Objective:   Physical Exam  Constitutional: She  appears well-developed and well-nourished. No distress.  HENT:  Head: Normocephalic.  Right Ear: External ear normal.  Left Ear: External ear normal.  Nose: Nose normal.  Mouth/Throat: Oropharynx is clear and moist.  Eyes: Conjunctivae are normal. Pupils are equal, round, and reactive to light. Right eye exhibits no discharge. Left eye exhibits no discharge.  Neck: Normal range of motion. Neck supple. No JVD present. No tracheal deviation present. No thyromegaly present.  Cardiovascular: Normal rate, regular rhythm and normal heart sounds.   Pulmonary/Chest: No stridor. No respiratory distress. She has no wheezes. She exhibits no tenderness.  Abdominal: Soft. Bowel sounds are normal. She exhibits no distension and no mass. There is no tenderness. There is no rebound and no guarding.  Musculoskeletal: She exhibits no edema or tenderness.  Lymphadenopathy:    She has no cervical adenopathy.  Neurological: She displays normal reflexes. No cranial nerve deficit. She exhibits normal muscle tone. Coordination normal.  Skin: No rash noted. No erythema.  Psychiatric: She has a normal mood and affect. Her behavior is normal. Judgment and thought content normal.    Lab Results  Component Value Date   WBC 6.0 10/18/2014   HGB 13.2 10/18/2014   HCT 39.5 10/18/2014   PLT 240 10/18/2014   GLUCOSE 109* 10/18/2014   CHOL 218* 02/10/2014   TRIG 42.0 02/10/2014   HDL 99.60 02/10/2014   LDLDIRECT 109.2 02/05/2012   LDLCALC 110* 02/10/2014   ALT 18 10/18/2014   AST 27 10/18/2014   NA 139 10/18/2014   K 4.6 10/18/2014   CL 104 10/18/2014   CREATININE 0.77 10/18/2014   BUN 9 10/18/2014   CO2 26 10/18/2014   TSH 1.60 02/10/2014          Assessment & Plan:

## 2015-02-12 NOTE — Progress Notes (Signed)
Pre visit review using our clinic review tool, if applicable. No additional management support is needed unless otherwise documented below in the visit note. 

## 2015-02-12 NOTE — Patient Instructions (Addendum)
Start low grade yoga or pilatesPreventive Care for Adults A healthy lifestyle and preventive care can promote health and wellness. Preventive health guidelines for women include the following key practices.  A routine yearly physical is a good way to check with your health care provider about your health and preventive screening. It is a chance to share any concerns and updates on your health and to receive a thorough exam.  Visit your dentist for a routine exam and preventive care every 6 months. Brush your teeth twice a day and floss once a day. Good oral hygiene prevents tooth decay and gum disease.  The frequency of eye exams is based on your age, health, family medical history, use of contact lenses, and other factors. Follow your health care provider's recommendations for frequency of eye exams.  Eat a healthy diet. Foods like vegetables, fruits, whole grains, low-fat dairy products, and lean protein foods contain the nutrients you need without too many calories. Decrease your intake of foods high in solid fats, added sugars, and salt. Eat the right amount of calories for you.Get information about a proper diet from your health care provider, if necessary.  Regular physical exercise is one of the most important things you can do for your health. Most adults should get at least 150 minutes of moderate-intensity exercise (any activity that increases your heart rate and causes you to sweat) each week. In addition, most adults need muscle-strengthening exercises on 2 or more days a week.  Maintain a healthy weight. The body mass index (BMI) is a screening tool to identify possible weight problems. It provides an estimate of body fat based on height and weight. Your health care provider can find your BMI and can help you achieve or maintain a healthy weight.For adults 20 years and older:  A BMI below 18.5 is considered underweight.  A BMI of 18.5 to 24.9 is normal.  A BMI of 25 to 29.9 is  considered overweight.  A BMI of 30 and above is considered obese.  Maintain normal blood lipids and cholesterol levels by exercising and minimizing your intake of saturated fat. Eat a balanced diet with plenty of fruit and vegetables. Blood tests for lipids and cholesterol should begin at age 65 and be repeated every 5 years. If your lipid or cholesterol levels are high, you are over 50, or you are at high risk for heart disease, you may need your cholesterol levels checked more frequently.Ongoing high lipid and cholesterol levels should be treated with medicines if diet and exercise are not working.  If you smoke, find out from your health care provider how to quit. If you do not use tobacco, do not start.  Lung cancer screening is recommended for adults aged 94-80 years who are at high risk for developing lung cancer because of a history of smoking. A yearly low-dose CT scan of the lungs is recommended for people who have at least a 30-pack-year history of smoking and are a current smoker or have quit within the past 15 years. A pack year of smoking is smoking an average of 1 pack of cigarettes a day for 1 year (for example: 1 pack a day for 30 years or 2 packs a day for 15 years). Yearly screening should continue until the smoker has stopped smoking for at least 15 years. Yearly screening should be stopped for people who develop a health problem that would prevent them from having lung cancer treatment.  If you are pregnant, do not drink  alcohol. If you are breastfeeding, be very cautious about drinking alcohol. If you are not pregnant and choose to drink alcohol, do not have more than 1 drink per day. One drink is considered to be 12 ounces (355 mL) of beer, 5 ounces (148 mL) of wine, or 1.5 ounces (44 mL) of liquor.  Avoid use of street drugs. Do not share needles with anyone. Ask for help if you need support or instructions about stopping the use of drugs.  High blood pressure causes heart  disease and increases the risk of stroke. Your blood pressure should be checked at least every 1 to 2 years. Ongoing high blood pressure should be treated with medicines if weight loss and exercise do not work.  If you are 52-76 years old, ask your health care provider if you should take aspirin to prevent strokes.  Diabetes screening involves taking a blood sample to check your fasting blood sugar level. This should be done once every 3 years, after age 66, if you are within normal weight and without risk factors for diabetes. Testing should be considered at a younger age or be carried out more frequently if you are overweight and have at least 1 risk factor for diabetes.  Breast cancer screening is essential preventive care for women. You should practice "breast self-awareness." This means understanding the normal appearance and feel of your breasts and may include breast self-examination. Any changes detected, no matter how small, should be reported to a health care provider. Women in their 57s and 30s should have a clinical breast exam (CBE) by a health care provider as part of a regular health exam every 1 to 3 years. After age 6, women should have a CBE every year. Starting at age 79, women should consider having a mammogram (breast X-ray test) every year. Women who have a family history of breast cancer should talk to their health care provider about genetic screening. Women at a high risk of breast cancer should talk to their health care providers about having an MRI and a mammogram every year.  Breast cancer gene (BRCA)-related cancer risk assessment is recommended for women who have family members with BRCA-related cancers. BRCA-related cancers include breast, ovarian, tubal, and peritoneal cancers. Having family members with these cancers may be associated with an increased risk for harmful changes (mutations) in the breast cancer genes BRCA1 and BRCA2. Results of the assessment will determine  the need for genetic counseling and BRCA1 and BRCA2 testing.  Routine pelvic exams to screen for cancer are no longer recommended for nonpregnant women who are considered low risk for cancer of the pelvic organs (ovaries, uterus, and vagina) and who do not have symptoms. Ask your health care provider if a screening pelvic exam is right for you.  If you have had past treatment for cervical cancer or a condition that could lead to cancer, you need Pap tests and screening for cancer for at least 20 years after your treatment. If Pap tests have been discontinued, your risk factors (such as having a new sexual partner) need to be reassessed to determine if screening should be resumed. Some women have medical problems that increase the chance of getting cervical cancer. In these cases, your health care provider may recommend more frequent screening and Pap tests.  The HPV test is an additional test that may be used for cervical cancer screening. The HPV test looks for the virus that can cause the cell changes on the cervix. The cells collected during  the Pap test can be tested for HPV. The HPV test could be used to screen women aged 38 years and older, and should be used in women of any age who have unclear Pap test results. After the age of 59, women should have HPV testing at the same frequency as a Pap test.  Colorectal cancer can be detected and often prevented. Most routine colorectal cancer screening begins at the age of 35 years and continues through age 38 years. However, your health care provider may recommend screening at an earlier age if you have risk factors for colon cancer. On a yearly basis, your health care provider may provide home test kits to check for hidden blood in the stool. Use of a small camera at the end of a tube, to directly examine the colon (sigmoidoscopy or colonoscopy), can detect the earliest forms of colorectal cancer. Talk to your health care provider about this at age 64, when  routine screening begins. Direct exam of the colon should be repeated every 5-10 years through age 18 years, unless early forms of pre-cancerous polyps or small growths are found.  People who are at an increased risk for hepatitis B should be screened for this virus. You are considered at high risk for hepatitis B if:  You were born in a country where hepatitis B occurs often. Talk with your health care provider about which countries are considered high risk.  Your parents were born in a high-risk country and you have not received a shot to protect against hepatitis B (hepatitis B vaccine).  You have HIV or AIDS.  You use needles to inject street drugs.  You live with, or have sex with, someone who has hepatitis B.  You get hemodialysis treatment.  You take certain medicines for conditions like cancer, organ transplantation, and autoimmune conditions.  Hepatitis C blood testing is recommended for all people born from [birth year] through 1965 and any individual with known risks for hepatitis C.  Practice safe sex. Use condoms and avoid high-risk sexual practices to reduce the spread of sexually transmitted infections (STIs). STIs include gonorrhea, chlamydia, syphilis, trichomonas, herpes, HPV, and human immunodeficiency virus (HIV). Herpes, HIV, and HPV are viral illnesses that have no cure. They can result in disability, cancer, and death.  You should be screened for sexually transmitted illnesses (STIs) including gonorrhea and chlamydia if:  You are sexually active and are younger than 24 years.  You are older than 24 years and your health care provider tells you that you are at risk for this type of infection.  Your sexual activity has changed since you were last screened and you are at an increased risk for chlamydia or gonorrhea. Ask your health care provider if you are at risk.  If you are at risk of being infected with HIV, it is recommended that you take a prescription medicine daily  to prevent HIV infection. This is called preexposure prophylaxis (PrEP). You are considered at risk if:  You are a heterosexual woman, are sexually active, and are at increased risk for HIV infection.  You take drugs by injection.  You are sexually active with a partner who has HIV.  Talk with your health care provider about whether you are at high risk of being infected with HIV. If you choose to begin PrEP, you should first be tested for HIV. You should then be tested every 3 months for as long as you are taking PrEP.  Osteoporosis is a disease in which the  bones lose minerals and strength with aging. This can result in serious bone fractures or breaks. The risk of osteoporosis can be identified using a bone density scan. Women ages 8 years and over and women at risk for fractures or osteoporosis should discuss screening with their health care providers. Ask your health care provider whether you should take a calcium supplement or vitamin D to reduce the rate of osteoporosis.  Menopause can be associated with physical symptoms and risks. Hormone replacement therapy is available to decrease symptoms and risks. You should talk to your health care provider about whether hormone replacement therapy is right for you.  Use sunscreen. Apply sunscreen liberally and repeatedly throughout the day. You should seek shade when your shadow is shorter than you. Protect yourself by wearing long sleeves, pants, a wide-brimmed hat, and sunglasses year round, whenever you are outdoors.  Once a month, do a whole body skin exam, using a mirror to look at the skin on your back. Tell your health care provider of new moles, moles that have irregular borders, moles that are larger than a pencil eraser, or moles that have changed in shape or color.  Stay current with required vaccines (immunizations).  Influenza vaccine. All adults should be immunized every year.  Tetanus, diphtheria, and acellular pertussis (Td,  Tdap) vaccine. Pregnant women should receive 1 dose of Tdap vaccine during each pregnancy. The dose should be obtained regardless of the length of time since the last dose. Immunization is preferred during the 27th-36th week of gestation. An adult who has not previously received Tdap or who does not know her vaccine status should receive 1 dose of Tdap. This initial dose should be followed by tetanus and diphtheria toxoids (Td) booster doses every 10 years. Adults with an unknown or incomplete history of completing a 3-dose immunization series with Td-containing vaccines should begin or complete a primary immunization series including a Tdap dose. Adults should receive a Td booster every 10 years.  Varicella vaccine. An adult without evidence of immunity to varicella should receive 2 doses or a second dose if she has previously received 1 dose. Pregnant females who do not have evidence of immunity should receive the first dose after pregnancy. This first dose should be obtained before leaving the health care facility. The second dose should be obtained 4-8 weeks after the first dose.  Human papillomavirus (HPV) vaccine. Females aged 13-26 years who have not received the vaccine previously should obtain the 3-dose series. The vaccine is not recommended for use in pregnant females. However, pregnancy testing is not needed before receiving a dose. If a female is found to be pregnant after receiving a dose, no treatment is needed. In that case, the remaining doses should be delayed until after the pregnancy. Immunization is recommended for any person with an immunocompromised condition through the age of 78 years if she did not get any or all doses earlier. During the 3-dose series, the second dose should be obtained 4-8 weeks after the first dose. The third dose should be obtained 24 weeks after the first dose and 16 weeks after the second dose.  Zoster vaccine. One dose is recommended for adults aged 44 years or  older unless certain conditions are present.  Measles, mumps, and rubella (MMR) vaccine. Adults born before [birth year] generally are considered immune to measles and mumps. Adults born in [birth year] or later should have 1 or more doses of MMR vaccine unless there is a contraindication to the vaccine or there is  laboratory evidence of immunity to each of the three diseases. A routine second dose of MMR vaccine should be obtained at least 28 days after the first dose for students attending postsecondary schools, health care workers, or international travelers. People who received inactivated measles vaccine or an unknown type of measles vaccine during 1963-1967 should receive 2 doses of MMR vaccine. People who received inactivated mumps vaccine or an unknown type of mumps vaccine before 1979 and are at high risk for mumps infection should consider immunization with 2 doses of MMR vaccine. For females of childbearing age, rubella immunity should be determined. If there is no evidence of immunity, females who are not pregnant should be vaccinated. If there is no evidence of immunity, females who are pregnant should delay immunization until after pregnancy. Unvaccinated health care workers born before [birth year] who lack laboratory evidence of measles, mumps, or rubella immunity or laboratory confirmation of disease should consider measles and mumps immunization with 2 doses of MMR vaccine or rubella immunization with 1 dose of MMR vaccine.  Pneumococcal 13-valent conjugate (PCV13) vaccine. When indicated, a person who is uncertain of her immunization history and has no record of immunization should receive the PCV13 vaccine. An adult aged 51 years or older who has certain medical conditions and has not been previously immunized should receive 1 dose of PCV13 vaccine. This PCV13 should be followed with a dose of pneumococcal polysaccharide (PPSV23) vaccine. The PPSV23 vaccine dose should be obtained at least 8 weeks after the dose of  PCV13 vaccine. An adult aged 36 years or older who has certain medical conditions and previously received 1 or more doses of PPSV23 vaccine should receive 1 dose of PCV13. The PCV13 vaccine dose should be obtained 1 or more years after the last PPSV23 vaccine dose.  Pneumococcal polysaccharide (PPSV23) vaccine. When PCV13 is also indicated, PCV13 should be obtained first. All adults aged 63 years and older should be immunized. An adult younger than age 58 years who has certain medical conditions should be immunized. Any person who resides in a nursing home or long-term care facility should be immunized. An adult smoker should be immunized. People with an immunocompromised condition and certain other conditions should receive both PCV13 and PPSV23 vaccines. People with human immunodeficiency virus (HIV) infection should be immunized as soon as possible after diagnosis. Immunization during chemotherapy or radiation therapy should be avoided. Routine use of PPSV23 vaccine is not recommended for American Indians, [place] Natives, or people younger than 65 years unless there are medical conditions that require PPSV23 vaccine. When indicated, people who have unknown immunization and have no record of immunization should receive PPSV23 vaccine. One-time revaccination 5 years after the first dose of PPSV23 is recommended for people aged 19-64 years who have chronic kidney failure, nephrotic syndrome, asplenia, or immunocompromised conditions. People who received 1-2 doses of PPSV23 before age 37 years should receive another dose of PPSV23 vaccine at age 67 years or later if at least 5 years have passed since the previous dose. Doses of PPSV23 are not needed for people immunized with PPSV23 at or after age 54 years.  Meningococcal vaccine. Adults with asplenia or persistent complement component deficiencies should receive 2 doses of quadrivalent meningococcal conjugate (MenACWY-D) vaccine. The doses should be obtained at  least 2 months apart. Microbiologists working with certain meningococcal bacteria, [employer] recruits, people at risk during an outbreak, and people who travel to or live in countries with a high rate of meningitis should be immunized. A first-year  college student up through age 41 years who is living in a residence hall should receive a dose if she did not receive a dose on or after her 16th birthday. Adults who have certain high-risk conditions should receive one or more doses of vaccine.  Hepatitis A vaccine. Adults who wish to be protected from this disease, have certain high-risk conditions, work with hepatitis A-infected animals, work in hepatitis A research labs, or travel to or work in countries with a high rate of hepatitis A should be immunized. Adults who were previously unvaccinated and who anticipate close contact with an international adoptee during the first 60 days after arrival in the [place] from a country with a high rate of hepatitis A should be immunized.  Hepatitis B vaccine. Adults who wish to be protected from this disease, have certain high-risk conditions, may be exposed to blood or other infectious body fluids, are household contacts or sex partners of hepatitis B positive people, are clients or workers in certain care facilities, or travel to or work in countries with a high rate of hepatitis B should be immunized.  Haemophilus influenzae type b (Hib) vaccine. A previously unvaccinated person with asplenia or sickle cell disease or having a scheduled splenectomy should receive 1 dose of Hib vaccine. Regardless of previous immunization, a recipient of a hematopoietic stem cell transplant should receive a 3-dose series 6-12 months after her successful transplant. Hib vaccine is not recommended for adults with HIV infection. Preventive Services / Frequency Ages 69 to 31 years  Blood pressure check.** / Every 1 to 2 years.  Lipid and cholesterol check.** / Every 5 years  beginning at age 28.  Clinical breast exam.** / Every 3 years for women in their 67s and 68s.  BRCA-related cancer risk assessment.** / For women who have family members with a BRCA-related cancer (breast, ovarian, tubal, or peritoneal cancers).  Pap test.** / Every 2 years from ages 58 through 81. Every 3 years starting at age 20 through age 22 or 73 with a history of 3 consecutive normal Pap tests.  HPV screening.** / Every 3 years from ages 9 through ages 24 to 29 with a history of 3 consecutive normal Pap tests.  Hepatitis C blood test.** / For any individual with known risks for hepatitis C.  Skin self-exam. / Monthly.  Influenza vaccine. / Every year.  Tetanus, diphtheria, and acellular pertussis (Tdap, Td) vaccine.** / Consult your health care provider. Pregnant women should receive 1 dose of Tdap vaccine during each pregnancy. 1 dose of Td every 10 years.  Varicella vaccine.** / Consult your health care provider. Pregnant females who do not have evidence of immunity should receive the first dose after pregnancy.  HPV vaccine. / 3 doses over 6 months, if 27 and younger. The vaccine is not recommended for use in pregnant females. However, pregnancy testing is not needed before receiving a dose.  Measles, mumps, rubella (MMR) vaccine.** / You need at least 1 dose of MMR if you were born in 1957 or later. You may also need a 2nd dose. For females of childbearing age, rubella immunity should be determined. If there is no evidence of immunity, females who are not pregnant should be vaccinated. If there is no evidence of immunity, females who are pregnant should delay immunization until after pregnancy.  Pneumococcal 13-valent conjugate (PCV13) vaccine.** / Consult your health care provider.  Pneumococcal polysaccharide (PPSV23) vaccine.** / 1 to 2 doses if you smoke cigarettes or if you have  certain conditions.  Meningococcal vaccine.** / 1 dose if you are age 45 to 61 years and a  Market researcher living in a residence hall, or have one of several medical conditions, you need to get vaccinated against meningococcal disease. You may also need additional booster doses.  Hepatitis A vaccine.** / Consult your health care provider.  Hepatitis B vaccine.** / Consult your health care provider.  Haemophilus influenzae type b (Hib) vaccine.** / Consult your health care provider. Ages 53 to 28 years  Blood pressure check.** / Every 1 to 2 years.  Lipid and cholesterol check.** / Every 5 years beginning at age 27 years.  Lung cancer screening. / Every year if you are aged 58-80 years and have a 30-pack-year history of smoking and currently smoke or have quit within the past 15 years. Yearly screening is stopped once you have quit smoking for at least 15 years or develop a health problem that would prevent you from having lung cancer treatment.  Clinical breast exam.** / Every year after age 60 years.  BRCA-related cancer risk assessment.** / For women who have family members with a BRCA-related cancer (breast, ovarian, tubal, or peritoneal cancers).  Mammogram.** / Every year beginning at age 79 years and continuing for as long as you are in good health. Consult with your health care provider.  Pap test.** / Every 3 years starting at age 33 years through age 4 or 34 years with a history of 3 consecutive normal Pap tests.  HPV screening.** / Every 3 years from ages 84 years through ages 48 to 36 years with a history of 3 consecutive normal Pap tests.  Fecal occult blood test (FOBT) of stool. / Every year beginning at age 12 years and continuing until age 11 years. You may not need to do this test if you get a colonoscopy every 10 years.  Flexible sigmoidoscopy or colonoscopy.** / Every 5 years for a flexible sigmoidoscopy or every 10 years for a colonoscopy beginning at age 64 years and continuing until age 56 years.  Hepatitis C blood test.** / For all people  born from [birth year] through 1967 and any individual with known risks for hepatitis C.  Skin self-exam. / Monthly.  Influenza vaccine. / Every year.  Tetanus, diphtheria, and acellular pertussis (Tdap/Td) vaccine.** / Consult your health care provider. Pregnant women should receive 1 dose of Tdap vaccine during each pregnancy. 1 dose of Td every 10 years.  Varicella vaccine.** / Consult your health care provider. Pregnant females who do not have evidence of immunity should receive the first dose after pregnancy.  Zoster vaccine.** / 1 dose for adults aged 67 years or older.  Measles, mumps, rubella (MMR) vaccine.** / You need at least 1 dose of MMR if you were born in 1957 or later. You may also need a 2nd dose. For females of childbearing age, rubella immunity should be determined. If there is no evidence of immunity, females who are not pregnant should be vaccinated. If there is no evidence of immunity, females who are pregnant should delay immunization until after pregnancy.  Pneumococcal 13-valent conjugate (PCV13) vaccine.** / Consult your health care provider.  Pneumococcal polysaccharide (PPSV23) vaccine.** / 1 to 2 doses if you smoke cigarettes or if you have certain conditions.  Meningococcal vaccine.** / Consult your health care provider.  Hepatitis A vaccine.** / Consult your health care provider.  Hepatitis B vaccine.** / Consult your health care provider.  Haemophilus influenzae type b (Hib) vaccine.** / Consult  your health care provider. Ages 74 years and over  Blood pressure check.** / Every 1 to 2 years.  Lipid and cholesterol check.** / Every 5 years beginning at age 87 years.  Lung cancer screening. / Every year if you are aged 49-80 years and have a 30-pack-year history of smoking and currently smoke or have quit within the past 15 years. Yearly screening is stopped once you have quit smoking for at least 15 years or develop a health problem that would prevent you from  having lung cancer treatment.  Clinical breast exam.** / Every year after age 48 years.  BRCA-related cancer risk assessment.** / For women who have family members with a BRCA-related cancer (breast, ovarian, tubal, or peritoneal cancers).  Mammogram.** / Every year beginning at age 39 years and continuing for as long as you are in good health. Consult with your health care provider.  Pap test.** / Every 3 years starting at age 36 years through age 56 or 75 years with 3 consecutive normal Pap tests. Testing can be stopped between 65 and 70 years with 3 consecutive normal Pap tests and no abnormal Pap or HPV tests in the past 10 years.  HPV screening.** / Every 3 years from ages 58 years through ages 93 or 64 years with a history of 3 consecutive normal Pap tests. Testing can be stopped between 65 and 70 years with 3 consecutive normal Pap tests and no abnormal Pap or HPV tests in the past 10 years.  Fecal occult blood test (FOBT) of stool. / Every year beginning at age 18 years and continuing until age 2 years. You may not need to do this test if you get a colonoscopy every 10 years.  Flexible sigmoidoscopy or colonoscopy.** / Every 5 years for a flexible sigmoidoscopy or every 10 years for a colonoscopy beginning at age 46 years and continuing until age 68 years.  Hepatitis C blood test.** / For all people born from [birth year] through 1965 and any individual with known risks for hepatitis C.  Osteoporosis screening.** / A one-time screening for women ages 54 years and over and women at risk for fractures or osteoporosis.  Skin self-exam. / Monthly.  Influenza vaccine. / Every year.  Tetanus, diphtheria, and acellular pertussis (Tdap/Td) vaccine.** / 1 dose of Td every 10 years.  Varicella vaccine.** / Consult your health care provider.  Zoster vaccine.** / 1 dose for adults aged 108 years or older.  Pneumococcal 13-valent conjugate (PCV13) vaccine.** / Consult your health care  provider.  Pneumococcal polysaccharide (PPSV23) vaccine.** / 1 dose for all adults aged 55 years and older.  Meningococcal vaccine.** / Consult your health care provider.  Hepatitis A vaccine.** / Consult your health care provider.  Hepatitis B vaccine.** / Consult your health care provider.  Haemophilus influenzae type b (Hib) vaccine.** / Consult your health care provider. ** Family history and personal history of risk and conditions may change your health care provider's recommendations. Document Released: 11/28/2001 Document Revised: 02/16/2014 Document Reviewed: 02/27/2011 [employer] Patient Information 2015 [place], [place]. This information is not intended to replace advice given to you by your health care provider. Make sure you discuss any questions you have with your health care provider.

## 2015-02-12 NOTE — Assessment & Plan Note (Signed)
Simvastatin 

## 2015-02-12 NOTE — Assessment & Plan Note (Signed)
On Simvastatin 

## 2015-03-11 LAB — HM MAMMOGRAPHY

## 2015-03-16 ENCOUNTER — Encounter: Payer: Medicare Other | Admitting: Gynecology

## 2015-03-24 ENCOUNTER — Encounter: Payer: Self-pay | Admitting: Internal Medicine

## 2015-03-25 ENCOUNTER — Ambulatory Visit (INDEPENDENT_AMBULATORY_CARE_PROVIDER_SITE_OTHER): Payer: Medicare Other | Admitting: Gynecology

## 2015-03-25 ENCOUNTER — Encounter: Payer: Self-pay | Admitting: Internal Medicine

## 2015-03-25 ENCOUNTER — Encounter: Payer: Self-pay | Admitting: Gynecology

## 2015-03-25 VITALS — BP 120/70 | Ht 65.0 in | Wt 112.0 lb

## 2015-03-25 DIAGNOSIS — N952 Postmenopausal atrophic vaginitis: Secondary | ICD-10-CM

## 2015-03-25 DIAGNOSIS — M8580 Other specified disorders of bone density and structure, unspecified site: Secondary | ICD-10-CM | POA: Diagnosis not present

## 2015-03-25 DIAGNOSIS — Z01419 Encounter for gynecological examination (general) (routine) without abnormal findings: Secondary | ICD-10-CM

## 2015-03-25 DIAGNOSIS — Z79890 Hormone replacement therapy (postmenopausal): Secondary | ICD-10-CM | POA: Diagnosis not present

## 2015-03-25 MED ORDER — ESTRADIOL 0.0375 MG/24HR TD PTTW: 1.0000 | MEDICATED_PATCH | TRANSDERMAL | Status: DC

## 2015-03-25 NOTE — Patient Instructions (Signed)
You may obtain a copy of any labs that were done today by logging onto MyChart as outlined in the instructions provided with your AVS (after visit summary). The office will not call with normal lab results but certainly if there are any significant abnormalities then we will contact you.   Health Maintenance, Female A healthy lifestyle and preventative care can promote health and wellness.  Maintain regular health, dental, and eye exams.  Eat a healthy diet. Foods like vegetables, fruits, whole grains, low-fat dairy products, and lean protein foods contain the nutrients you need without too many calories. Decrease your intake of foods high in solid fats, added sugars, and salt. Get information about a proper diet from your caregiver, if necessary.  Regular physical exercise is one of the most important things you can do for your health. Most adults should get at least 150 minutes of moderate-intensity exercise (any activity that increases your heart rate and causes you to sweat) each week. In addition, most adults need muscle-strengthening exercises on 2 or more days a week.   Maintain a healthy weight. The body mass index (BMI) is a screening tool to identify possible weight problems. It provides an estimate of body fat based on height and weight. Your caregiver can help determine your BMI, and can help you achieve or maintain a healthy weight. For adults 20 years and older:  A BMI below 18.5 is considered underweight.  A BMI of 18.5 to 24.9 is normal.  A BMI of 25 to 29.9 is considered overweight.  A BMI of 30 and above is considered obese.  Maintain normal blood lipids and cholesterol by exercising and minimizing your intake of saturated fat. Eat a balanced diet with plenty of fruits and vegetables. Blood tests for lipids and cholesterol should begin at age 61 and be repeated every 5 years. If your lipid or cholesterol levels are high, you are over 50, or you are a high risk for heart  disease, you may need your cholesterol levels checked more frequently.Ongoing high lipid and cholesterol levels should be treated with medicines if diet and exercise are not effective.  If you smoke, find out from your caregiver how to quit. If you do not use tobacco, do not start.  Lung cancer screening is recommended for adults aged 33 80 years who are at high risk for developing lung cancer because of a history of smoking. Yearly low-dose computed tomography (CT) is recommended for people who have at least a 30-pack-year history of smoking and are a current smoker or have quit within the past 15 years. A pack year of smoking is smoking an average of 1 pack of cigarettes a day for 1 year (for example: 1 pack a day for 30 years or 2 packs a day for 15 years). Yearly screening should continue until the smoker has stopped smoking for at least 15 years. Yearly screening should also be stopped for people who develop a health problem that would prevent them from having lung cancer treatment.  If you are pregnant, do not drink alcohol. If you are breastfeeding, be very cautious about drinking alcohol. If you are not pregnant and choose to drink alcohol, do not exceed 1 drink per day. One drink is considered to be 12 ounces (355 mL) of beer, 5 ounces (148 mL) of wine, or 1.5 ounces (44 mL) of liquor.  Avoid use of street drugs. Do not share needles with anyone. Ask for help if you need support or instructions about stopping  the use of drugs.  High blood pressure causes heart disease and increases the risk of stroke. Blood pressure should be checked at least every 1 to 2 years. Ongoing high blood pressure should be treated with medicines, if weight loss and exercise are not effective.  If you are 59 to 71 years old, ask your caregiver if you should take aspirin to prevent strokes.  Diabetes screening involves taking a blood sample to check your fasting blood sugar level. This should be done once every 3  years, after age 91, if you are within normal weight and without risk factors for diabetes. Testing should be considered at a younger age or be carried out more frequently if you are overweight and have at least 1 risk factor for diabetes.  Breast cancer screening is essential preventative care for women. You should practice "breast self-awareness." This means understanding the normal appearance and feel of your breasts and may include breast self-examination. Any changes detected, no matter how small, should be reported to a caregiver. Women in their 66s and 30s should have a clinical breast exam (CBE) by a caregiver as part of a regular health exam every 1 to 3 years. After age 101, women should have a CBE every year. Starting at age 100, women should consider having a mammogram (breast X-ray) every year. Women who have a family history of breast cancer should talk to their caregiver about genetic screening. Women at a high risk of breast cancer should talk to their caregiver about having an MRI and a mammogram every year.  Breast cancer gene (BRCA)-related cancer risk assessment is recommended for women who have family members with BRCA-related cancers. BRCA-related cancers include breast, ovarian, tubal, and peritoneal cancers. Having family members with these cancers may be associated with an increased risk for harmful changes (mutations) in the breast cancer genes BRCA1 and BRCA2. Results of the assessment will determine the need for genetic counseling and BRCA1 and BRCA2 testing.  The Pap test is a screening test for cervical cancer. Women should have a Pap test starting at age 57. Between ages 25 and 35, Pap tests should be repeated every 2 years. Beginning at age 37, you should have a Pap test every 3 years as long as the past 3 Pap tests have been normal. If you had a hysterectomy for a problem that was not cancer or a condition that could lead to cancer, then you no longer need Pap tests. If you are  between ages 50 and 76, and you have had normal Pap tests going back 10 years, you no longer need Pap tests. If you have had past treatment for cervical cancer or a condition that could lead to cancer, you need Pap tests and screening for cancer for at least 20 years after your treatment. If Pap tests have been discontinued, risk factors (such as a new sexual partner) need to be reassessed to determine if screening should be resumed. Some women have medical problems that increase the chance of getting cervical cancer. In these cases, your caregiver may recommend more frequent screening and Pap tests.  The human papillomavirus (HPV) test is an additional test that may be used for cervical cancer screening. The HPV test looks for the virus that can cause the cell changes on the cervix. The cells collected during the Pap test can be tested for HPV. The HPV test could be used to screen women aged 44 years and older, and should be used in women of any age  who have unclear Pap test results. After the age of 55, women should have HPV testing at the same frequency as a Pap test.  Colorectal cancer can be detected and often prevented. Most routine colorectal cancer screening begins at the age of 44 and continues through age 20. However, your caregiver may recommend screening at an earlier age if you have risk factors for colon cancer. On a yearly basis, your caregiver may provide home test kits to check for hidden blood in the stool. Use of a small camera at the end of a tube, to directly examine the colon (sigmoidoscopy or colonoscopy), can detect the earliest forms of colorectal cancer. Talk to your caregiver about this at age 86, when routine screening begins. Direct examination of the colon should be repeated every 5 to 10 years through age 13, unless early forms of pre-cancerous polyps or small growths are found.  Hepatitis C blood testing is recommended for all people born from [birth year] through 1967 and any  individual with known risks for hepatitis C.  Practice safe sex. Use condoms and avoid high-risk sexual practices to reduce the spread of sexually transmitted infections (STIs). Sexually active women aged 36 and younger should be checked for Chlamydia, which is a common sexually transmitted infection. Older women with new or multiple partners should also be tested for Chlamydia. Testing for other STIs is recommended if you are sexually active and at increased risk.  Osteoporosis is a disease in which the bones lose minerals and strength with aging. This can result in serious bone fractures. The risk of osteoporosis can be identified using a bone density scan. Women ages 20 and over and women at risk for fractures or osteoporosis should discuss screening with their caregivers. Ask your caregiver whether you should be taking a calcium supplement or vitamin D to reduce the rate of osteoporosis.  Menopause can be associated with physical symptoms and risks. Hormone replacement therapy is available to decrease symptoms and risks. You should talk to your caregiver about whether hormone replacement therapy is right for you.  Use sunscreen. Apply sunscreen liberally and repeatedly throughout the day. You should seek shade when your shadow is shorter than you. Protect yourself by wearing long sleeves, pants, a wide-brimmed hat, and sunglasses year round, whenever you are outdoors.  Notify your caregiver of new moles or changes in moles, especially if there is a change in shape or color. Also notify your caregiver if a mole is larger than the size of a pencil eraser.  Stay current with your immunizations. Document Released: 04/17/2011 Document Revised: 01/27/2013 Document Reviewed: 04/17/2011 [facility] Patient Information 2014 [place].

## 2015-03-25 NOTE — Progress Notes (Signed)
Brittany Mathis [birth year] [id number]        69 y.o.  [name] for breast and pelvic exam. Several issues noted below  Past medical history,surgical history, problem list, medications, allergies, family history and social history were all reviewed and documented as reviewed in the EPIC chart.  ROS:  Performed with pertinent positives and negatives included in the history, assessment and plan.   Additional significant findings :  none   Exam: [facility] Vitals:   03/25/15 1208  BP: 120/70  Height: 5\' 5"  (1.651 m)  Weight: 112 lb (50.803 kg)   General appearance:  Normal affect, orientation and appearance. Skin: Grossly normal excepting numerous seborrheic keratoses over her chest, back and abdomen HEENT: Without gross lesions.  No cervical or supraclavicular adenopathy. Thyroid normal.  Lungs:  Clear without wheezing, rales or rhonchi Cardiac: RR, without RMG Abdominal:  Soft, nontender, without masses, guarding, rebound, organomegaly or hernia Breasts:  Examined lying and sitting without masses, retractions, discharge or axillary adenopathy. Pelvic:  Ext/BUS/vagina with atrophic changes  Adnexa  Without masses or tenderness    Anus and perineum  Normal   Rectovaginal  Normal sphincter tone without palpated masses or tenderness.    Assessment/Plan:  69 y.o. G74P1001 female for breast and pelvic exam.   1. Postmenopausal/HRT. Status post TAH/BSO for endometriosis and menorrhagia in the past. On Vivelle 0.0375 patches used every 4 days. Tried stopping but had unacceptable hot flushes. I again reviewed the whole issue of HRT, lowest dose for shortest period of time. Risks to include stroke heart attack DVT and breast cancer all discussed. Patient feels this is a quality-of-life issue and clearly understands the risks and wants to continue. Refill 1 year provided. 2. Vaginal dryness. Is having some issues with intercourse. Tried OTC lubricants without success. Will try oil-based to  see if this does not help. Reviewed vaginal estrogen supplementation to include creams, Vagifem, Osphena. Will call if she wants to pursue vaginal estrogen supplementation. Issues of absorption and risks also reviewed. 3. Osteopenia.  DEXA 03/2014 T score -1.9. FRAX 10%/1.9%. Stable from prior DEXA 2013. Plan repeat at 2 year interval. Increased calcium vitamin D reviewed. 4. Pap smear 2012. No Pap smear done today. No history of abnormal Pap smears. We both agree to stop screening given she is over the age of 23 and status post hysterectomy for benign indications per current screening guidelines. 5. Colonoscopy 2009. Patient's going to check to see when she is due for repeat with her gastroenterologist. 6. Mammography 02/2015. Continue with annual mammography. SBE monthly reviewed. 7. Health maintenance. No routine blood work done as patient has this done at her primary physician's office. Follow up in one year, sooner as needed.     Dara Lords MD, 12:31 PM 03/25/2015

## 2015-03-26 LAB — URINALYSIS W MICROSCOPIC + REFLEX CULTURE
Bacteria, UA: NONE SEEN
Bilirubin Urine: NEGATIVE
Casts: NONE SEEN
Crystals: NONE SEEN
GLUCOSE, UA: NEGATIVE mg/dL
Hgb urine dipstick: NEGATIVE
Ketones, ur: NEGATIVE mg/dL
Leukocytes, UA: NEGATIVE
Nitrite: NEGATIVE
PROTEIN: NEGATIVE mg/dL
SQUAMOUS EPITHELIAL / LPF: NONE SEEN
Specific Gravity, Urine: <1.005 — ABNORMAL LOW (ref 1.005–1.030)
Urobilinogen, UA: 0.2 mg/dL (ref 0.0–1.0)
pH: 6.5 (ref 5.0–8.0)

## 2015-06-10 ENCOUNTER — Telehealth: Payer: Self-pay | Admitting: *Deleted

## 2015-06-10 MED ORDER — ESTRADIOL 0.0375 MG/24HR TD PTTW: 1.0000 | MEDICATED_PATCH | TRANSDERMAL | Status: DC

## 2015-06-10 NOTE — Telephone Encounter (Signed)
Rx sent 

## 2015-06-10 NOTE — Telephone Encounter (Signed)
-----   Message from Jerilynn Mages sent at 06/10/2015 12:39 PM EDT ----- Regarding: RX Patient call her mail order pharmacy for vivelle refill and she was told it had expire. TF send refill on March 25 2015 so can you please resend.  estradiol (VIVELLE-DOT) 0.0375 MG/24HR to [facility] - [place], [place] - [address] Thx

## 2015-06-15 ENCOUNTER — Telehealth: Payer: Self-pay | Admitting: *Deleted

## 2015-06-15 NOTE — Telephone Encounter (Signed)
Prior Authorization for estradiol 0.0375 mg patch done on-line , will wait for response.

## 2015-06-23 NOTE — Telephone Encounter (Signed)
Medication approved effective 06/18/15-06/17/16, pt aware

## 2015-08-16 ENCOUNTER — Ambulatory Visit (INDEPENDENT_AMBULATORY_CARE_PROVIDER_SITE_OTHER): Payer: Medicare Other | Admitting: Internal Medicine

## 2015-08-16 ENCOUNTER — Encounter: Payer: Self-pay | Admitting: Internal Medicine

## 2015-08-16 VITALS — BP 138/82 | HR 73 | Wt 112.0 lb

## 2015-08-16 DIAGNOSIS — I10 Essential (primary) hypertension: Secondary | ICD-10-CM | POA: Diagnosis not present

## 2015-08-16 DIAGNOSIS — E785 Hyperlipidemia, unspecified: Secondary | ICD-10-CM | POA: Diagnosis not present

## 2015-08-16 DIAGNOSIS — N951 Menopausal and female climacteric states: Secondary | ICD-10-CM | POA: Insufficient documentation

## 2015-08-16 NOTE — Progress Notes (Signed)
Subjective:  Patient ID: Brittany Mathis, female    DOB: Mar 31, 1946  Age: 69 y.o. MRN: [id number]  CC: No chief complaint on file.   HPI Brittany Mathis presents for dyslipidemia, HTN, menopausal sx's  Outpatient Prescriptions Prior to Visit  Medication Sig Dispense Refill  . cholecalciferol (VITAMIN D) 1000 UNITS tablet Take 1,000 Units by mouth daily.      [name]. estradiol (VIVELLE-DOT) 0.0375 MG/24HR Place 1 patch onto the skin 2 (two) times a week. 24 patch 4  . Multiple Vitamin (MULTIVITAMIN) tablet Take 2 tablets by mouth daily.     . simvastatin (ZOCOR) 20 MG tablet Take 1 tablet by mouth  every night at bedtime 90 tablet 3  . valsartan (DIOVAN) 160 MG tablet Take 1 tablet (160 mg total) by mouth daily. 90 tablet 3   No facility-administered medications prior to visit.    ROS Review of Systems  Constitutional: Negative for chills, activity change, appetite change, fatigue and unexpected weight change.  HENT: Negative for congestion, mouth sores and sinus pressure.   Eyes: Negative for visual disturbance.  Respiratory: Negative for cough and chest tightness.   Gastrointestinal: Negative for nausea and abdominal pain.  Genitourinary: Negative for frequency, difficulty urinating and vaginal pain.  Musculoskeletal: Negative for back pain and gait problem.  Skin: Negative for pallor and rash.  Neurological: Negative for dizziness, tremors, weakness, numbness and headaches.  Psychiatric/Behavioral: Negative for suicidal ideas, confusion and sleep disturbance.    Objective:  BP 138/82 mmHg  Pulse 73  Wt 112 lb (50.803 kg)  SpO2 98%  BP Readings from Last 3 Encounters:  08/16/15 138/82  03/25/15 120/70  02/12/15 135/80    Wt Readings from Last 3 Encounters:  08/16/15 112 lb (50.803 kg)  03/25/15 112 lb (50.803 kg)  02/12/15 112 lb (50.803 kg)    Physical Exam  Constitutional: She appears well-developed. No distress.  HENT:  Head: Normocephalic.  Right Ear: External ear  normal.  Left Ear: External ear normal.  Nose: Nose normal.  Mouth/Throat: Oropharynx is clear and moist.  Eyes: Conjunctivae are normal. Pupils are equal, round, and reactive to light. Right eye exhibits no discharge. Left eye exhibits no discharge.  Neck: Normal range of motion. Neck supple. No JVD present. No tracheal deviation present. No thyromegaly present.  Cardiovascular: Normal rate, regular rhythm and normal heart sounds.   Pulmonary/Chest: No stridor. No respiratory distress. She has no wheezes.  Abdominal: Soft. Bowel sounds are normal. She exhibits no distension and no mass. There is no tenderness. There is no rebound and no guarding.  Musculoskeletal: She exhibits no edema or tenderness.  Lymphadenopathy:    She has no cervical adenopathy.  Neurological: She displays normal reflexes. No cranial nerve deficit. She exhibits normal muscle tone. Coordination normal.  Skin: No rash noted. No erythema.  Psychiatric: She has a normal mood and affect. Her behavior is normal. Judgment and thought content normal.    Lab Results  Component Value Date   WBC 5.3 02/12/2015   HGB 13.3 02/12/2015   HCT 38.7 02/12/2015   PLT 241.0 02/12/2015   GLUCOSE 97 02/12/2015   CHOL 228* 02/12/2015   TRIG 65.0 02/12/2015   HDL 95.90 02/12/2015   LDLDIRECT 109.2 02/05/2012   LDLCALC 119* 02/12/2015   ALT 14 02/12/2015   AST 19 02/12/2015   NA 134* 02/12/2015   K 4.5 02/12/2015   CL 98 02/12/2015   CREATININE 0.62 02/12/2015   BUN 14 02/12/2015   CO2  29 02/12/2015   TSH 1.55 02/12/2015    Ct Head Wo Contrast  10/18/2014  CLINICAL DATA:  Left-sided abdominal pain and left-sided paresthesias. EXAM: CT HEAD WITHOUT CONTRAST TECHNIQUE: Contiguous axial images were obtained from the base of the skull through the vertex without intravenous contrast. COMPARISON:  07/09/2012 FINDINGS: Stable moderate small vessel disease in the periventricular [place] matter. The brain demonstrates no evidence of  hemorrhage, infarction, edema, mass effect, extra-axial fluid collection, hydrocephalus or mass lesion. The skull is unremarkable. IMPRESSION: Stable small vessel disease.  No acute findings. Electronically Signed   By: Irish Lack M.D.   On: 10/18/2014 14:33   Ct Renal Stone Study  10/18/2014  CLINICAL DATA:  Initial encounter for left-sided pain that radiates around to the back off and on for about 1 month but became worse last night. EXAM: CT ABDOMEN AND PELVIS WITHOUT CONTRAST TECHNIQUE: Multidetector CT imaging of the abdomen and pelvis was performed following the standard protocol without IV contrast. COMPARISON:  02/08/2012 FINDINGS: Lower chest: Stable 2 mm right lower lobe pulmonary nodule, consistent with benign process such as scarring. There is some chronic atelectasis or linear scarring in the left base. Hepatobiliary: No focal abnormality in the liver on this study without intravenous contrast. No evidence for hepatomegaly. There is no evidence for gallstones, gallbladder wall thickening, or pericholecystic fluid. No intrahepatic or extrahepatic biliary dilation. Pancreas: No focal mass lesion. No dilatation of the main duct. No intraparenchymal cyst. No peripancreatic edema. Spleen: No splenomegaly. No focal mass lesion. Adrenals/Urinary Tract: No adrenal nodule or mass. 2 mm nonobstructing stones are seen in the upper pole and interpolar region of the right kidney. No evidence for right-sided hydroureteronephrosis. No right ureteral stone. Mild left hydroureteronephrosis is evident. Multiple stones are seen in the left kidney ranging in size from a 2 mm stone in the upper pole to a 5 x 9 x 5 mm stone in the left renal pelvis. Perienteric edema is seen down to the level of the pelvis. A 7 x 4 x 4 mm stone is identified at the left ureterovesical junction. The bladder is decompressed. Stomach/Bowel: Stomach is nondistended. No gastric wall thickening. No evidence of outlet obstruction. The  duodenum does not cross the midline and small bowel remains clustered in the right abdomen. The cecum is not positioned in the right lower quadrant but is seen in the midline central pelvis with most of the colon located in the left abdomen. No evidence for small bowel obstruction. No gross colonic abnormality is evident. The appendix is normal. Vascular/Lymphatic: Atherosclerotic calcification is noted in the wall of the abdominal aorta without aneurysm. No lymphadenopathy in the abdomen. No pelvic sidewall lymphadenopathy. Reproductive: Uterus is not clearly visualized and may be surgically absent. There is no adnexal mass. Other: No intraperitoneal free fluid. Musculoskeletal: Bone windows reveal no worrisome lytic or sclerotic osseous lesions. IMPRESSION: Bilateral nephrolithiasis, left greater than right, with a 7 x 4 x 4 mm stone at the left ureterovesical junction causing mild left hydroureteronephrosis. Intestinal malrotation.  No evidence for obstruction. Electronically Signed   By: Kennith Center M.D.   On: 10/18/2014 14:39    Assessment & Plan:   Diagnoses and all orders for this visit:  Essential hypertension  Dyslipidemia  Menopausal symptoms   I am having Ms. Danser maintain her multivitamin, cholecalciferol, valsartan, simvastatin, and estradiol.  No orders of the defined types were placed in this encounter.     Follow-up: Return in about 6 months (around 02/13/2016)  for Wellness Exam.  Walker Kehr, MD

## 2015-08-16 NOTE — Assessment & Plan Note (Signed)
Valsartan 

## 2015-08-16 NOTE — Assessment & Plan Note (Signed)
Chronic Vivelle

## 2015-08-16 NOTE — Progress Notes (Signed)
Pre visit review using our clinic review tool, if applicable. No additional management support is needed unless otherwise documented below in the visit note. 

## 2015-08-16 NOTE — Assessment & Plan Note (Signed)
On Simvastatin 

## 2015-08-18 ENCOUNTER — Encounter: Payer: Self-pay | Admitting: Internal Medicine

## 2015-12-06 ENCOUNTER — Telehealth: Payer: Self-pay | Admitting: Internal Medicine

## 2015-12-06 NOTE — Telephone Encounter (Signed)
Patient Name: Brittany Mathis DOB: [birth year] Initial Comment Caller states she woke up with sinus headache, sore throat, congestion, achey with low fever on Saturday and has not Nurse Assessment Nurse: Elijah Birk, RN, Stark Bray Date/Time (Eastern Time): 12/06/2015 10:29:38 AM Confirm and document reason for call. If symptomatic, describe symptoms. You must click the next button to save text entered. ---Caller states she woke up with sinus headache, sore throat, ear pain, congestion, achy with low-grade fever on Saturday. Has tried extra strength Tylenol. Symptoms have worsened. Has the patient traveled out of the country within the last 30 days? ---Not Applicable Does the patient have any new or worsening symptoms? ---Yes Will a triage be completed? ---Yes Related visit to physician within the last 2 weeks? ---No Does the PT have any chronic conditions? (i.e. diabetes, asthma, etc.) ---Yes List chronic conditions. ---on BP rx Is this a behavioral health or substance abuse call? ---No Guidelines Guideline Title Affirmed Question Affirmed Notes Sinus Pain or Congestion [1] Redness or swelling on the cheek, forehead or around the eye AND [2] fever Final Disposition User Go to ED Now (or PCP triage) Elijah Birk, RN, Lynda Comments Caller stated she could go to a local UC, but asked if there were any appts. tomorrow with her provider as they are full on the schedule today. Nurse found an opening tomorrow am. Jethro Bolus states she will try sinus nasal washes in the meantime & stay up on her fluid intake. Uses [facility] on corner of [address] NKDA. Referrals GO TO FACILITY UNDECIDED REFERRED TO PCP OFFICE Disagree/Comply: Comply

## 2015-12-07 ENCOUNTER — Encounter: Payer: Self-pay | Admitting: Internal Medicine

## 2015-12-07 ENCOUNTER — Ambulatory Visit (INDEPENDENT_AMBULATORY_CARE_PROVIDER_SITE_OTHER): Payer: Medicare Other | Admitting: Internal Medicine

## 2015-12-07 VITALS — BP 120/88 | HR 84 | Temp 97.7°F | Wt 117.0 lb

## 2015-12-07 DIAGNOSIS — J309 Allergic rhinitis, unspecified: Secondary | ICD-10-CM

## 2015-12-07 DIAGNOSIS — J069 Acute upper respiratory infection, unspecified: Secondary | ICD-10-CM | POA: Diagnosis not present

## 2015-12-07 MED ORDER — PROMETHAZINE-CODEINE 6.25-10 MG/5ML PO SYRP: 5.0000 mL | ORAL_SOLUTION | ORAL | Status: DC | PRN

## 2015-12-07 MED ORDER — AZITHROMYCIN 250 MG PO TABS: ORAL_TABLET | ORAL | Status: DC

## 2015-12-07 NOTE — Patient Instructions (Signed)
Use over-the-counter  "cold" medicines   Avoid decongestants if you have high blood pressure and use "Afrin" nasal spray for nasal congestion as directed instead. Use" Delsym" or" Robitussin" cough syrup varietis for cough.  You can use plain "Tylenol" or "Advil" for fever, chills and achyness. Use Halls or Ricola cough drops.   "Common cold" symptoms are usually triggered by a virus.  The antibiotics are usually not necessary. On average, a" viral cold" illness would take 4-7 days to resolve. Please, make an appointment if you are not better or if you're worse.  Claritin for allergies

## 2015-12-07 NOTE — Assessment & Plan Note (Addendum)
Zpac if worse Prom cod 

## 2015-12-07 NOTE — Assessment & Plan Note (Signed)
Claritin prn 

## 2015-12-07 NOTE — Progress Notes (Signed)
Pre visit review using our clinic review tool, if applicable. No additional management support is needed unless otherwise documented below in the visit note. 

## 2015-12-07 NOTE — Progress Notes (Signed)
Subjective:  Patient ID: Brittany Mathis, female    DOB: [birth year]  Age: 70 y.o. MRN: [id number]  CC: No chief complaint on file.   HPI Brittany Mathis presents for sinus pain, watery eyes, nasal congestion - clear drainage since Fri  Outpatient Prescriptions Prior to Visit  Medication Sig Dispense Refill  . cholecalciferol (VITAMIN D) 1000 UNITS tablet Take 1,000 Units by mouth daily.      [name] estradiol (VIVELLE-DOT) 0.0375 MG/24HR Place 1 patch onto the skin 2 (two) times a week. 24 patch 4  . Multiple Vitamin (MULTIVITAMIN) tablet Take 2 tablets by mouth daily.     . simvastatin (ZOCOR) 20 MG tablet Take 1 tablet by mouth  every night at bedtime 90 tablet 3  . valsartan (DIOVAN) 160 MG tablet Take 1 tablet (160 mg total) by mouth daily. 90 tablet 3   No facility-administered medications prior to visit.    ROS Review of Systems  Constitutional: Negative for chills, activity change, appetite change, fatigue and unexpected weight change.  HENT: Positive for congestion and rhinorrhea. Negative for mouth sores and sinus pressure.   Eyes: Negative for visual disturbance.  Respiratory: Positive for cough. Negative for chest tightness.   Gastrointestinal: Negative for nausea and abdominal pain.  Genitourinary: Negative for frequency, difficulty urinating and vaginal pain.  Musculoskeletal: Negative for back pain and gait problem.  Skin: Negative for pallor and rash.  Neurological: Negative for dizziness, tremors, weakness, numbness and headaches.  Psychiatric/Behavioral: Negative for confusion and sleep disturbance.    Objective:  BP 120/88 mmHg  Pulse 84  Temp(Src) 97.7 F (36.5 C) (Oral)  Wt 117 lb (53.071 kg)  SpO2 98%  BP Readings from Last 3 Encounters:  12/07/15 120/88  08/16/15 138/82  03/25/15 120/70    Wt Readings from Last 3 Encounters:  12/07/15 117 lb (53.071 kg)  08/16/15 112 lb (50.803 kg)  03/25/15 112 lb (50.803 kg)    Physical Exam  Constitutional: She  appears well-developed. No distress.  HENT:  Head: Normocephalic.  Right Ear: External ear normal.  Left Ear: External ear normal.  Eyes: Conjunctivae are normal. Pupils are equal, round, and reactive to light. Right eye exhibits no discharge. Left eye exhibits no discharge.  Neck: Normal range of motion. Neck supple. No JVD present. No tracheal deviation present. No thyromegaly present.  Cardiovascular: Normal rate, regular rhythm and normal heart sounds.   Pulmonary/Chest: No stridor. No respiratory distress. She has no wheezes.  Abdominal: Soft. Bowel sounds are normal. She exhibits no distension and no mass. There is no tenderness. There is no rebound and no guarding.  Musculoskeletal: She exhibits no edema or tenderness.  Lymphadenopathy:    She has no cervical adenopathy.  Neurological: She displays normal reflexes. No cranial nerve deficit. She exhibits normal muscle tone. Coordination normal.  Skin: No rash noted. No erythema.  Psychiatric: She has a normal mood and affect. Her behavior is normal. Judgment and thought content normal.  eryth lining  Lab Results  Component Value Date   WBC 5.3 02/12/2015   HGB 13.3 02/12/2015   HCT 38.7 02/12/2015   PLT 241.0 02/12/2015   GLUCOSE 97 02/12/2015   CHOL 228* 02/12/2015   TRIG 65.0 02/12/2015   HDL 95.90 02/12/2015   LDLDIRECT 109.2 02/05/2012   LDLCALC 119* 02/12/2015   ALT 14 02/12/2015   AST 19 02/12/2015   NA 134* 02/12/2015   K 4.5 02/12/2015   CL 98 02/12/2015   CREATININE 0.62 02/12/2015  BUN 14 02/12/2015   CO2 29 02/12/2015   TSH 1.55 02/12/2015    Ct Head Wo Contrast  10/18/2014  CLINICAL DATA:  Left-sided abdominal pain and left-sided paresthesias. EXAM: CT HEAD WITHOUT CONTRAST TECHNIQUE: Contiguous axial images were obtained from the base of the skull through the vertex without intravenous contrast. COMPARISON:  07/09/2012 FINDINGS: Stable moderate small vessel disease in the periventricular [place] matter. The  brain demonstrates no evidence of hemorrhage, infarction, edema, mass effect, extra-axial fluid collection, hydrocephalus or mass lesion. The skull is unremarkable. IMPRESSION: Stable small vessel disease.  No acute findings. Electronically Signed   By: Irish Lack M.D.   On: 10/18/2014 14:33   Ct Renal Stone Study  10/18/2014  CLINICAL DATA:  Initial encounter for left-sided pain that radiates around to the back off and on for about 1 month but became worse last night. EXAM: CT ABDOMEN AND PELVIS WITHOUT CONTRAST TECHNIQUE: Multidetector CT imaging of the abdomen and pelvis was performed following the standard protocol without IV contrast. COMPARISON:  02/08/2012 FINDINGS: Lower chest: Stable 2 mm right lower lobe pulmonary nodule, consistent with benign process such as scarring. There is some chronic atelectasis or linear scarring in the left base. Hepatobiliary: No focal abnormality in the liver on this study without intravenous contrast. No evidence for hepatomegaly. There is no evidence for gallstones, gallbladder wall thickening, or pericholecystic fluid. No intrahepatic or extrahepatic biliary dilation. Pancreas: No focal mass lesion. No dilatation of the main duct. No intraparenchymal cyst. No peripancreatic edema. Spleen: No splenomegaly. No focal mass lesion. Adrenals/Urinary Tract: No adrenal nodule or mass. 2 mm nonobstructing stones are seen in the upper pole and interpolar region of the right kidney. No evidence for right-sided hydroureteronephrosis. No right ureteral stone. Mild left hydroureteronephrosis is evident. Multiple stones are seen in the left kidney ranging in size from a 2 mm stone in the upper pole to a 5 x 9 x 5 mm stone in the left renal pelvis. Perienteric edema is seen down to the level of the pelvis. A 7 x 4 x 4 mm stone is identified at the left ureterovesical junction. The bladder is decompressed. Stomach/Bowel: Stomach is nondistended. No gastric wall thickening. No evidence  of outlet obstruction. The duodenum does not cross the midline and small bowel remains clustered in the right abdomen. The cecum is not positioned in the right lower quadrant but is seen in the midline central pelvis with most of the colon located in the left abdomen. No evidence for small bowel obstruction. No gross colonic abnormality is evident. The appendix is normal. Vascular/Lymphatic: Atherosclerotic calcification is noted in the wall of the abdominal aorta without aneurysm. No lymphadenopathy in the abdomen. No pelvic sidewall lymphadenopathy. Reproductive: Uterus is not clearly visualized and may be surgically absent. There is no adnexal mass. Other: No intraperitoneal free fluid. Musculoskeletal: Bone windows reveal no worrisome lytic or sclerotic osseous lesions. IMPRESSION: Bilateral nephrolithiasis, left greater than right, with a 7 x 4 x 4 mm stone at the left ureterovesical junction causing mild left hydroureteronephrosis. Intestinal malrotation.  No evidence for obstruction. Electronically Signed   By: Kennith Center M.D.   On: 10/18/2014 14:39    Assessment & Plan:   There are no diagnoses linked to this encounter. I am having Ms. Base maintain her multivitamin, cholecalciferol, valsartan, simvastatin, and estradiol.  No orders of the defined types were placed in this encounter.     Follow-up: No Follow-up on file.  Sonda Primes, MD

## 2016-02-15 ENCOUNTER — Ambulatory Visit (INDEPENDENT_AMBULATORY_CARE_PROVIDER_SITE_OTHER): Payer: Medicare Other | Admitting: Internal Medicine

## 2016-02-15 ENCOUNTER — Encounter: Payer: Self-pay | Admitting: Internal Medicine

## 2016-02-15 ENCOUNTER — Other Ambulatory Visit (INDEPENDENT_AMBULATORY_CARE_PROVIDER_SITE_OTHER): Payer: Medicare Other

## 2016-02-15 VITALS — BP 116/70 | HR 72 | Temp 97.7°F | Ht 65.0 in | Wt 112.0 lb

## 2016-02-15 DIAGNOSIS — E785 Hyperlipidemia, unspecified: Secondary | ICD-10-CM

## 2016-02-15 DIAGNOSIS — I10 Essential (primary) hypertension: Secondary | ICD-10-CM | POA: Diagnosis not present

## 2016-02-15 DIAGNOSIS — Z0000 Encounter for general adult medical examination without abnormal findings: Secondary | ICD-10-CM | POA: Diagnosis not present

## 2016-02-15 LAB — LIPID PANEL
CHOLESTEROL: 229 mg/dL — AB (ref 0–200)
HDL: 103.4 mg/dL (ref >39.00)
LDL CALC: 111 mg/dL — AB (ref 0–99)
NonHDL: 125.56
TRIGLYCERIDES: 72 mg/dL (ref 0.0–149.0)
Total CHOL/HDL Ratio: 2
VLDL: 14.4 mg/dL (ref 0.0–40.0)

## 2016-02-15 LAB — BASIC METABOLIC PANEL
BUN: 13 mg/dL (ref 6–23)
CALCIUM: 9.8 mg/dL (ref 8.4–10.5)
CHLORIDE: 99 meq/L (ref 96–112)
CO2: 28 mEq/L (ref 19–32)
CREATININE: 0.67 mg/dL (ref 0.40–1.20)
GFR: 92.12 mL/min (ref >60.00)
Glucose, Bld: 100 mg/dL — ABNORMAL HIGH (ref 70–99)
Potassium: 4.8 mEq/L (ref 3.5–5.1)
Sodium: 136 mEq/L (ref 135–145)

## 2016-02-15 LAB — CBC WITH DIFFERENTIAL/PLATELET
Basophils Absolute: 0 10*3/uL (ref 0.0–0.1)
Basophils Relative: 0.5 % (ref 0.0–3.0)
EOS ABS: 0.3 10*3/uL (ref 0.0–0.7)
Eosinophils Relative: 5.2 % — ABNORMAL HIGH (ref 0.0–5.0)
HCT: 40.2 % (ref 36.0–46.0)
Hemoglobin: 13.6 g/dL (ref 12.0–15.0)
Lymphocytes Relative: 31.7 % (ref 12.0–46.0)
Lymphs Abs: 1.7 10*3/uL (ref 0.7–4.0)
MCHC: 33.9 g/dL (ref 30.0–36.0)
MCV: 87 fl (ref 78.0–100.0)
MONO ABS: 0.5 10*3/uL (ref 0.1–1.0)
Monocytes Relative: 9.5 % (ref 3.0–12.0)
Neutro Abs: 2.8 10*3/uL (ref 1.4–7.7)
Neutrophils Relative %: 53.1 % (ref 43.0–77.0)
PLATELETS: 265 10*3/uL (ref 150.0–400.0)
RBC: 4.62 Mil/uL (ref 3.87–5.11)
RDW: 13.2 % (ref 11.5–15.5)
WBC: 5.3 10*3/uL (ref 4.0–10.5)

## 2016-02-15 LAB — URINALYSIS
BILIRUBIN URINE: NEGATIVE
HGB URINE DIPSTICK: NEGATIVE
Ketones, ur: 15 — AB
Leukocytes, UA: NEGATIVE
NITRITE: NEGATIVE
Specific Gravity, Urine: 1.01 (ref 1.000–1.030)
TOTAL PROTEIN, URINE-UPE24: NEGATIVE
UROBILINOGEN UA: 0.2 (ref 0.0–1.0)
Urine Glucose: NEGATIVE
pH: 6 (ref 5.0–8.0)

## 2016-02-15 LAB — HEPATIC FUNCTION PANEL
ALT: 15 U/L (ref 0–35)
AST: 20 U/L (ref 0–37)
Albumin: 4.6 g/dL (ref 3.5–5.2)
Alkaline Phosphatase: 39 U/L (ref 39–117)
Bilirubin, Direct: 0.1 mg/dL (ref 0.0–0.3)
Total Bilirubin: 0.6 mg/dL (ref 0.2–1.2)
Total Protein: 7.4 g/dL (ref 6.0–8.3)

## 2016-02-15 LAB — TSH: TSH: 1.77 u[IU]/mL (ref 0.35–4.50)

## 2016-02-15 MED ORDER — SIMVASTATIN 20 MG PO TABS: ORAL_TABLET | ORAL | Status: DC

## 2016-02-15 MED ORDER — VALSARTAN 160 MG PO TABS: 160.0000 mg | ORAL_TABLET | Freq: Every day | ORAL | Status: DC

## 2016-02-15 NOTE — Progress Notes (Signed)
Pre visit review using our clinic review tool, if applicable. No additional management support is needed unless otherwise documented below in the visit note. 

## 2016-02-15 NOTE — Patient Instructions (Signed)
Preventive Care for Adults, Female A healthy lifestyle and preventive care can promote health and wellness. Preventive health guidelines for women include the following key practices.  A routine yearly physical is a good way to check with your health care provider about your health and preventive screening. It is a chance to share any concerns and updates on your health and to receive a thorough exam.  Visit your dentist for a routine exam and preventive care every 6 months. Brush your teeth twice a day and floss once a day. Good oral hygiene prevents tooth decay and gum disease.  The frequency of eye exams is based on your age, health, family medical history, use of contact lenses, and other factors. Follow your health care provider's recommendations for frequency of eye exams.  Eat a healthy diet. Foods like vegetables, fruits, whole grains, low-fat dairy products, and lean protein foods contain the nutrients you need without too many calories. Decrease your intake of foods high in solid fats, added sugars, and salt. Eat the right amount of calories for you.Get information about a proper diet from your health care provider, if necessary.  Regular physical exercise is one of the most important things you can do for your health. Most adults should get at least 150 minutes of moderate-intensity exercise (any activity that increases your heart rate and causes you to sweat) each week. In addition, most adults need muscle-strengthening exercises on 2 or more days a week.  Maintain a healthy weight. The body mass index (BMI) is a screening tool to identify possible weight problems. It provides an estimate of body fat based on height and weight. Your health care provider can find your BMI and can help you achieve or maintain a healthy weight.For adults 20 years and older:  A BMI below 18.5 is considered underweight.  A BMI of 18.5 to 24.9 is normal.  A BMI of 25 to 29.9 is considered overweight.  A  BMI of 30 and above is considered obese.  Maintain normal blood lipids and cholesterol levels by exercising and minimizing your intake of saturated fat. Eat a balanced diet with plenty of fruit and vegetables. Blood tests for lipids and cholesterol should begin at age 45 and be repeated every 5 years. If your lipid or cholesterol levels are high, you are over 50, or you are at high risk for heart disease, you may need your cholesterol levels checked more frequently.Ongoing high lipid and cholesterol levels should be treated with medicines if diet and exercise are not working.  If you smoke, find out from your health care provider how to quit. If you do not use tobacco, do not start.  Lung cancer screening is recommended for adults aged 45-80 years who are at high risk for developing lung cancer because of a history of smoking. A yearly low-dose CT scan of the lungs is recommended for people who have at least a 30-pack-year history of smoking and are a current smoker or have quit within the past 15 years. A pack year of smoking is smoking an average of 1 pack of cigarettes a day for 1 year (for example: 1 pack a day for 30 years or 2 packs a day for 15 years). Yearly screening should continue until the smoker has stopped smoking for at least 15 years. Yearly screening should be stopped for people who develop a health problem that would prevent them from having lung cancer treatment.  If you are pregnant, do not drink alcohol. If you are  breastfeeding, be very cautious about drinking alcohol. If you are not pregnant and choose to drink alcohol, do not have more than 1 drink per day. One drink is considered to be 12 ounces (355 mL) of beer, 5 ounces (148 mL) of wine, or 1.5 ounces (44 mL) of liquor.  Avoid use of street drugs. Do not share needles with anyone. Ask for help if you need support or instructions about stopping the use of drugs.  High blood pressure causes heart disease and increases the risk  of stroke. Your blood pressure should be checked at least every 1 to 2 years. Ongoing high blood pressure should be treated with medicines if weight loss and exercise do not work.  If you are 55-79 years old, ask your health care provider if you should take aspirin to prevent strokes.  Diabetes screening is done by taking a blood sample to check your blood glucose level after you have not eaten for a certain period of time (fasting). If you are not overweight and you do not have risk factors for diabetes, you should be screened once every 3 years starting at age 45. If you are overweight or obese and you are 40-70 years of age, you should be screened for diabetes every year as part of your cardiovascular risk assessment.  Breast cancer screening is essential preventive care for women. You should practice "breast self-awareness." This means understanding the normal appearance and feel of your breasts and may include breast self-examination. Any changes detected, no matter how small, should be reported to a health care provider. Women in their 20s and 30s should have a clinical breast exam (CBE) by a health care provider as part of a regular health exam every 1 to 3 years. After age 40, women should have a CBE every year. Starting at age 40, women should consider having a mammogram (breast X-ray test) every year. Women who have a family history of breast cancer should talk to their health care provider about genetic screening. Women at a high risk of breast cancer should talk to their health care providers about having an MRI and a mammogram every year.  Breast cancer gene (BRCA)-related cancer risk assessment is recommended for women who have family members with BRCA-related cancers. BRCA-related cancers include breast, ovarian, tubal, and peritoneal cancers. Having family members with these cancers may be associated with an increased risk for harmful changes (mutations) in the breast cancer genes BRCA1 and  BRCA2. Results of the assessment will determine the need for genetic counseling and BRCA1 and BRCA2 testing.  Your health care provider may recommend that you be screened regularly for cancer of the pelvic organs (ovaries, uterus, and vagina). This screening involves a pelvic examination, including checking for microscopic changes to the surface of your cervix (Pap test). You may be encouraged to have this screening done every 3 years, beginning at age 21.  For women ages 30-65, health care providers may recommend pelvic exams and Pap testing every 3 years, or they may recommend the Pap and pelvic exam, combined with testing for human papilloma virus (HPV), every 5 years. Some types of HPV increase your risk of cervical cancer. Testing for HPV may also be done on women of any age with unclear Pap test results.  Other health care providers may not recommend any screening for nonpregnant women who are considered low risk for pelvic cancer and who do not have symptoms. Ask your health care provider if a screening pelvic exam is right for   you.  If you have had past treatment for cervical cancer or a condition that could lead to cancer, you need Pap tests and screening for cancer for at least 20 years after your treatment. If Pap tests have been discontinued, your risk factors (such as having a new sexual partner) need to be reassessed to determine if screening should resume. Some women have medical problems that increase the chance of getting cervical cancer. In these cases, your health care provider may recommend more frequent screening and Pap tests.  Colorectal cancer can be detected and often prevented. Most routine colorectal cancer screening begins at the age of 50 years and continues through age 75 years. However, your health care provider may recommend screening at an earlier age if you have risk factors for colon cancer. On a yearly basis, your health care provider may provide home test kits to check  for hidden blood in the stool. Use of a small camera at the end of a tube, to directly examine the colon (sigmoidoscopy or colonoscopy), can detect the earliest forms of colorectal cancer. Talk to your health care provider about this at age 50, when routine screening begins. Direct exam of the colon should be repeated every 5-10 years through age 75 years, unless early forms of precancerous polyps or small growths are found.  People who are at an increased risk for hepatitis B should be screened for this virus. You are considered at high risk for hepatitis B if:  You were born in a country where hepatitis B occurs often. Talk with your health care provider about which countries are considered high risk.  Your parents were born in a high-risk country and you have not received a shot to protect against hepatitis B (hepatitis B vaccine).  You have HIV or AIDS.  You use needles to inject street drugs.  You live with, or have sex with, someone who has hepatitis B.  You get hemodialysis treatment.  You take certain medicines for conditions like cancer, organ transplantation, and autoimmune conditions.  Hepatitis C blood testing is recommended for all people born from 1945 through 1965 and any individual with known risks for hepatitis C.  Practice safe sex. Use condoms and avoid high-risk sexual practices to reduce the spread of sexually transmitted infections (STIs). STIs include gonorrhea, chlamydia, syphilis, trichomonas, herpes, HPV, and human immunodeficiency virus (HIV). Herpes, HIV, and HPV are viral illnesses that have no cure. They can result in disability, cancer, and death.  You should be screened for sexually transmitted illnesses (STIs) including gonorrhea and chlamydia if:  You are sexually active and are younger than 24 years.  You are older than 24 years and your health care provider tells you that you are at risk for this type of infection.  Your sexual activity has changed  since you were last screened and you are at an increased risk for chlamydia or gonorrhea. Ask your health care provider if you are at risk.  If you are at risk of being infected with HIV, it is recommended that you take a prescription medicine daily to prevent HIV infection. This is called preexposure prophylaxis (PrEP). You are considered at risk if:  You are sexually active and do not regularly use condoms or know the HIV status of your partner(s).  You take drugs by injection.  You are sexually active with a partner who has HIV.  Talk with your health care provider about whether you are at high risk of being infected with HIV. If   you choose to begin PrEP, you should first be tested for HIV. You should then be tested every 3 months for as long as you are taking PrEP.  Osteoporosis is a disease in which the bones lose minerals and strength with aging. This can result in serious bone fractures or breaks. The risk of osteoporosis can be identified using a bone density scan. Women ages 67 years and over and women at risk for fractures or osteoporosis should discuss screening with their health care providers. Ask your health care provider whether you should take a calcium supplement or vitamin D to reduce the rate of osteoporosis.  Menopause can be associated with physical symptoms and risks. Hormone replacement therapy is available to decrease symptoms and risks. You should talk to your health care provider about whether hormone replacement therapy is right for you.  Use sunscreen. Apply sunscreen liberally and repeatedly throughout the day. You should seek shade when your shadow is shorter than you. Protect yourself by wearing long sleeves, pants, a wide-brimmed hat, and sunglasses year round, whenever you are outdoors.  Once a month, do a whole body skin exam, using a mirror to look at the skin on your back. Tell your health care provider of new moles, moles that have irregular borders, moles that  are larger than a pencil eraser, or moles that have changed in shape or color.  Stay current with required vaccines (immunizations).  Influenza vaccine. All adults should be immunized every year.  Tetanus, diphtheria, and acellular pertussis (Td, Tdap) vaccine. Pregnant women should receive 1 dose of Tdap vaccine during each pregnancy. The dose should be obtained regardless of the length of time since the last dose. Immunization is preferred during the 27th-36th week of gestation. An adult who has not previously received Tdap or who does not know her vaccine status should receive 1 dose of Tdap. This initial dose should be followed by tetanus and diphtheria toxoids (Td) booster doses every 10 years. Adults with an unknown or incomplete history of completing a 3-dose immunization series with Td-containing vaccines should begin or complete a primary immunization series including a Tdap dose. Adults should receive a Td booster every 10 years.  Varicella vaccine. An adult without evidence of immunity to varicella should receive 2 doses or a second dose if she has previously received 1 dose. Pregnant females who do not have evidence of immunity should receive the first dose after pregnancy. This first dose should be obtained before leaving the health care facility. The second dose should be obtained 4-8 weeks after the first dose.  Human papillomavirus (HPV) vaccine. Females aged 13-26 years who have not received the vaccine previously should obtain the 3-dose series. The vaccine is not recommended for use in pregnant females. However, pregnancy testing is not needed before receiving a dose. If a female is found to be pregnant after receiving a dose, no treatment is needed. In that case, the remaining doses should be delayed until after the pregnancy. Immunization is recommended for any person with an immunocompromised condition through the age of 61 years if she did not get any or all doses earlier. During the  3-dose series, the second dose should be obtained 4-8 weeks after the first dose. The third dose should be obtained 24 weeks after the first dose and 16 weeks after the second dose.  Zoster vaccine. One dose is recommended for adults aged 30 years or older unless certain conditions are present.  Measles, mumps, and rubella (MMR) vaccine. Adults born  before 1957 generally are considered immune to measles and mumps. Adults born in 1957 or later should have 1 or more doses of MMR vaccine unless there is a contraindication to the vaccine or there is laboratory evidence of immunity to each of the three diseases. A routine second dose of MMR vaccine should be obtained at least 28 days after the first dose for students attending postsecondary schools, health care workers, or international travelers. People who received inactivated measles vaccine or an unknown type of measles vaccine during 1963-1967 should receive 2 doses of MMR vaccine. People who received inactivated mumps vaccine or an unknown type of mumps vaccine before 1979 and are at high risk for mumps infection should consider immunization with 2 doses of MMR vaccine. For females of childbearing age, rubella immunity should be determined. If there is no evidence of immunity, females who are not pregnant should be vaccinated. If there is no evidence of immunity, females who are pregnant should delay immunization until after pregnancy. Unvaccinated health care workers born before 1957 who lack laboratory evidence of measles, mumps, or rubella immunity or laboratory confirmation of disease should consider measles and mumps immunization with 2 doses of MMR vaccine or rubella immunization with 1 dose of MMR vaccine.  Pneumococcal 13-valent conjugate (PCV13) vaccine. When indicated, a person who is uncertain of his immunization history and has no record of immunization should receive the PCV13 vaccine. All adults 65 years of age and older should receive this  vaccine. An adult aged 19 years or older who has certain medical conditions and has not been previously immunized should receive 1 dose of PCV13 vaccine. This PCV13 should be followed with a dose of pneumococcal polysaccharide (PPSV23) vaccine. Adults who are at high risk for pneumococcal disease should obtain the PPSV23 vaccine at least 8 weeks after the dose of PCV13 vaccine. Adults older than 72 years of age who have normal immune system function should obtain the PPSV23 vaccine dose at least 1 year after the dose of PCV13 vaccine.  Pneumococcal polysaccharide (PPSV23) vaccine. When PCV13 is also indicated, PCV13 should be obtained first. All adults aged 65 years and older should be immunized. An adult younger than age 65 years who has certain medical conditions should be immunized. Any person who resides in a nursing home or long-term care facility should be immunized. An adult smoker should be immunized. People with an immunocompromised condition and certain other conditions should receive both PCV13 and PPSV23 vaccines. People with human immunodeficiency virus (HIV) infection should be immunized as soon as possible after diagnosis. Immunization during chemotherapy or radiation therapy should be avoided. Routine use of PPSV23 vaccine is not recommended for American Indians, [place] Natives, or people younger than 65 years unless there are medical conditions that require PPSV23 vaccine. When indicated, people who have unknown immunization and have no record of immunization should receive PPSV23 vaccine. One-time revaccination 5 years after the first dose of PPSV23 is recommended for people aged 19-64 years who have chronic kidney failure, nephrotic syndrome, asplenia, or immunocompromised conditions. People who received 1-2 doses of PPSV23 before age 65 years should receive another dose of PPSV23 vaccine at age 65 years or later if at least 5 years have passed since the previous dose. Doses of PPSV23 are not  needed for people immunized with PPSV23 at or after age 65 years.  Meningococcal vaccine. Adults with asplenia or persistent complement component deficiencies should receive 2 doses of quadrivalent meningococcal conjugate (MenACWY-D) vaccine. The doses should be obtained   at least 2 months apart. Microbiologists working with certain meningococcal bacteria, [place] recruits, people at risk during an outbreak, and people who travel to or live in countries with a high rate of meningitis should be immunized. A first-year college student up through age 34 years who is living in a residence hall should receive a dose if she did not receive a dose on or after her 16th birthday. Adults who have certain high-risk conditions should receive one or more doses of vaccine.  Hepatitis A vaccine. Adults who wish to be protected from this disease, have certain high-risk conditions, work with hepatitis A-infected animals, work in hepatitis A research labs, or travel to or work in countries with a high rate of hepatitis A should be immunized. Adults who were previously unvaccinated and who anticipate close contact with an international adoptee during the first 60 days after arrival in the [place] from a country with a high rate of hepatitis A should be immunized.  Hepatitis B vaccine. Adults who wish to be protected from this disease, have certain high-risk conditions, may be exposed to blood or other infectious body fluids, are household contacts or sex partners of hepatitis B positive people, are clients or workers in certain care facilities, or travel to or work in countries with a high rate of hepatitis B should be immunized.  Haemophilus influenzae type b (Hib) vaccine. A previously unvaccinated person with asplenia or sickle cell disease or having a scheduled splenectomy should receive 1 dose of Hib vaccine. Regardless of previous immunization, a recipient of a hematopoietic stem cell transplant should receive a  3-dose series 6-12 months after her successful transplant. Hib vaccine is not recommended for adults with HIV infection. Preventive Services / Frequency Ages 35 to 4 years  Blood pressure check.** / Every 3-5 years.  Lipid and cholesterol check.** / Every 5 years beginning at age 60.  Clinical breast exam.** / Every 3 years for women in their 71s and 10s.  BRCA-related cancer risk assessment.** / For women who have family members with a BRCA-related cancer (breast, ovarian, tubal, or peritoneal cancers).  Pap test.** / Every 2 years from ages 76 through 26. Every 3 years starting at age 61 through age 76 or 93 with a history of 3 consecutive normal Pap tests.  HPV screening.** / Every 3 years from ages 37 through ages 60 to 51 with a history of 3 consecutive normal Pap tests.  Hepatitis C blood test.** / For any individual with known risks for hepatitis C.  Skin self-exam. / Monthly.  Influenza vaccine. / Every year.  Tetanus, diphtheria, and acellular pertussis (Tdap, Td) vaccine.** / Consult your health care provider. Pregnant women should receive 1 dose of Tdap vaccine during each pregnancy. 1 dose of Td every 10 years.  Varicella vaccine.** / Consult your health care provider. Pregnant females who do not have evidence of immunity should receive the first dose after pregnancy.  HPV vaccine. / 3 doses over 6 months, if 93 and younger. The vaccine is not recommended for use in pregnant females. However, pregnancy testing is not needed before receiving a dose.  Measles, mumps, rubella (MMR) vaccine.** / You need at least 1 dose of MMR if you were born in 1957 or later. You may also need a 2nd dose. For females of childbearing age, rubella immunity should be determined. If there is no evidence of immunity, females who are not pregnant should be vaccinated. If there is no evidence of immunity, females who are  pregnant should delay immunization until after pregnancy.  Pneumococcal  13-valent conjugate (PCV13) vaccine.** / Consult your health care provider.  Pneumococcal polysaccharide (PPSV23) vaccine.** / 1 to 2 doses if you smoke cigarettes or if you have certain conditions.  Meningococcal vaccine.** / 1 dose if you are age 68 to 8 years and a Market researcher living in a residence hall, or have one of several medical conditions, you need to get vaccinated against meningococcal disease. You may also need additional booster doses.  Hepatitis A vaccine.** / Consult your health care provider.  Hepatitis B vaccine.** / Consult your health care provider.  Haemophilus influenzae type b (Hib) vaccine.** / Consult your health care provider. Ages 7 to 53 years  Blood pressure check.** / Every year.  Lipid and cholesterol check.** / Every 5 years beginning at age 25 years.  Lung cancer screening. / Every year if you are aged 11-80 years and have a 30-pack-year history of smoking and currently smoke or have quit within the past 15 years. Yearly screening is stopped once you have quit smoking for at least 15 years or develop a health problem that would prevent you from having lung cancer treatment.  Clinical breast exam.** / Every year after age 48 years.  BRCA-related cancer risk assessment.** / For women who have family members with a BRCA-related cancer (breast, ovarian, tubal, or peritoneal cancers).  Mammogram.** / Every year beginning at age 41 years and continuing for as long as you are in good health. Consult with your health care provider.  Pap test.** / Every 3 years starting at age 65 years through age 37 or 70 years with a history of 3 consecutive normal Pap tests.  HPV screening.** / Every 3 years from ages 72 years through ages 60 to 40 years with a history of 3 consecutive normal Pap tests.  Fecal occult blood test (FOBT) of stool. / Every year beginning at age 21 years and continuing until age 5 years. You may not need to do this test if you get  a colonoscopy every 10 years.  Flexible sigmoidoscopy or colonoscopy.** / Every 5 years for a flexible sigmoidoscopy or every 10 years for a colonoscopy beginning at age 35 years and continuing until age 48 years.  Hepatitis C blood test.** / For all people born from [birth year] through 1965 and any individual with known risks for hepatitis C.  Skin self-exam. / Monthly.  Influenza vaccine. / Every year.  Tetanus, diphtheria, and acellular pertussis (Tdap/Td) vaccine.** / Consult your health care provider. Pregnant women should receive 1 dose of Tdap vaccine during each pregnancy. 1 dose of Td every 10 years.  Varicella vaccine.** / Consult your health care provider. Pregnant females who do not have evidence of immunity should receive the first dose after pregnancy.  Zoster vaccine.** / 1 dose for adults aged 30 years or older.  Measles, mumps, rubella (MMR) vaccine.** / You need at least 1 dose of MMR if you were born in 1957 or later. You may also need a second dose. For females of childbearing age, rubella immunity should be determined. If there is no evidence of immunity, females who are not pregnant should be vaccinated. If there is no evidence of immunity, females who are pregnant should delay immunization until after pregnancy.  Pneumococcal 13-valent conjugate (PCV13) vaccine.** / Consult your health care provider.  Pneumococcal polysaccharide (PPSV23) vaccine.** / 1 to 2 doses if you smoke cigarettes or if you have certain conditions.  Meningococcal vaccine.** /  Consult your health care provider.  Hepatitis A vaccine.** / Consult your health care provider.  Hepatitis B vaccine.** / Consult your health care provider.  Haemophilus influenzae type b (Hib) vaccine.** / Consult your health care provider. Ages 64 years and over  Blood pressure check.** / Every year.  Lipid and cholesterol check.** / Every 5 years beginning at age 23 years.  Lung cancer screening. / Every year if you  are aged 16-80 years and have a 30-pack-year history of smoking and currently smoke or have quit within the past 15 years. Yearly screening is stopped once you have quit smoking for at least 15 years or develop a health problem that would prevent you from having lung cancer treatment.  Clinical breast exam.** / Every year after age 74 years.  BRCA-related cancer risk assessment.** / For women who have family members with a BRCA-related cancer (breast, ovarian, tubal, or peritoneal cancers).  Mammogram.** / Every year beginning at age 44 years and continuing for as long as you are in good health. Consult with your health care provider.  Pap test.** / Every 3 years starting at age 58 years through age 22 or 39 years with 3 consecutive normal Pap tests. Testing can be stopped between 65 and 70 years with 3 consecutive normal Pap tests and no abnormal Pap or HPV tests in the past 10 years.  HPV screening.** / Every 3 years from ages 64 years through ages 70 or 61 years with a history of 3 consecutive normal Pap tests. Testing can be stopped between 65 and 70 years with 3 consecutive normal Pap tests and no abnormal Pap or HPV tests in the past 10 years.  Fecal occult blood test (FOBT) of stool. / Every year beginning at age 40 years and continuing until age 27 years. You may not need to do this test if you get a colonoscopy every 10 years.  Flexible sigmoidoscopy or colonoscopy.** / Every 5 years for a flexible sigmoidoscopy or every 10 years for a colonoscopy beginning at age 7 years and continuing until age 32 years.  Hepatitis C blood test.** / For all people born from [birth year] through 1967 and any individual with known risks for hepatitis C.  Osteoporosis screening.** / A one-time screening for women ages 30 years and over and women at risk for fractures or osteoporosis.  Skin self-exam. / Monthly.  Influenza vaccine. / Every year.  Tetanus, diphtheria, and acellular pertussis (Tdap/Td)  vaccine.** / 1 dose of Td every 10 years.  Varicella vaccine.** / Consult your health care provider.  Zoster vaccine.** / 1 dose for adults aged 35 years or older.  Pneumococcal 13-valent conjugate (PCV13) vaccine.** / Consult your health care provider.  Pneumococcal polysaccharide (PPSV23) vaccine.** / 1 dose for all adults aged 46 years and older.  Meningococcal vaccine.** / Consult your health care provider.  Hepatitis A vaccine.** / Consult your health care provider.  Hepatitis B vaccine.** / Consult your health care provider.  Haemophilus influenzae type b (Hib) vaccine.** / Consult your health care provider. ** Family history and personal history of risk and conditions may change your health care provider's recommendations.   This information is not intended to replace advice given to you by your health care provider. Make sure you discuss any questions you have with your health care provider.   Document Released: 11/28/2001 Document Revised: 10/23/2014 Document Reviewed: 02/27/2011 Elsevier Interactive Patient Education [employer].

## 2016-02-15 NOTE — Assessment & Plan Note (Signed)
On Simvastatin 

## 2016-02-15 NOTE — Assessment & Plan Note (Signed)
Here for medicare wellness/physical  Diet: heart healthy  Physical activity: not sedentary  Depression/mood screen: negative  Hearing: intact to whispered voice  Visual acuity: grossly normal, performs annual eye exam  ADLs: capable  Fall risk: none  Home safety: good  Cognitive evaluation: intact to orientation, naming, recall and repetition  EOL planning: adv directives, full code/ I agree  I have personally reviewed and have noted  1. The patient's medical, surgical and social history  2. Their use of alcohol, tobacco or illicit drugs  3. Their current medications and supplements  4. The patient's functional ability including ADL's, fall risks, home safety risks and hearing or visual impairment.  5. Diet and physical activities  6. Evidence for depression or mood disorders 7. The roster of all physicians providing medical care to patient - is listed in the Snapshot section of the chart and reviewed today.    Today patient counseled on age appropriate routine health concerns for screening and prevention, each reviewed and up to date or declined. Immunizations reviewed and up to date or declined. Labs ordered and reviewed. Risk factors for depression reviewed and negative. Hearing function and visual acuity are intact. ADLs screened and addressed as needed. Functional ability and level of safety reviewed and appropriate. Education, counseling and referrals performed based on assessed risks today. Patient provided with a copy of personalized plan for preventive services.   Dr Ileana RoupFontaine Mammo - pending in May Eye exam Dr Hazle Quantigby

## 2016-02-15 NOTE — Assessment & Plan Note (Signed)
Valsartan 

## 2016-02-15 NOTE — Progress Notes (Signed)
Subjective:  Patient ID: Brittany Mathis, female    DOB: 07/31/1946  Age: 70 y.o. MRN: [id number]  CC: No chief complaint on file.   HPI Brittany Mathis presents for a well exam F/u HTN, dyslipidemia  Outpatient Prescriptions Prior to Visit  Medication Sig Dispense Refill  . cholecalciferol (VITAMIN D) 1000 UNITS tablet Take 1,000 Units by mouth daily.      [name] estradiol (VIVELLE-DOT) 0.0375 MG/24HR Place 1 patch onto the skin 2 (two) times a week. 24 patch 4  . Multiple Vitamin (MULTIVITAMIN) tablet Take 2 tablets by mouth daily.     . simvastatin (ZOCOR) 20 MG tablet Take 1 tablet by mouth  every night at bedtime 90 tablet 3  . azithromycin (ZITHROMAX) 250 MG tablet As directed 6 tablet 0  . promethazine-codeine (PHENERGAN WITH CODEINE) 6.25-10 MG/5ML syrup Take 5 mLs by mouth every 4 (four) hours as needed. 300 mL 0  . valsartan (DIOVAN) 160 MG tablet Take 1 tablet (160 mg total) by mouth daily. 90 tablet 3   No facility-administered medications prior to visit.    ROS Review of Systems  Constitutional: Negative for chills, activity change, appetite change, fatigue and unexpected weight change.  HENT: Negative for congestion, mouth sores and sinus pressure.   Eyes: Negative for visual disturbance.  Respiratory: Negative for cough and chest tightness.   Gastrointestinal: Negative for nausea and abdominal pain.  Genitourinary: Negative for frequency, difficulty urinating and vaginal pain.  Musculoskeletal: Negative for back pain and gait problem.  Skin: Negative for pallor and rash.  Neurological: Negative for dizziness, tremors, weakness, numbness and headaches.  Psychiatric/Behavioral: Negative for suicidal ideas, confusion and sleep disturbance.    Objective:  BP 116/70 mmHg  Pulse 72  Temp(Src) 97.7 F (36.5 C) (Oral)  Ht 5\' 5"  (1.651 m)  Wt 112 lb (50.803 kg)  BMI 18.64 kg/m2  SpO2 98%  BP Readings from Last 3 Encounters:  02/15/16 116/70  12/07/15 120/88  08/16/15  138/82    Wt Readings from Last 3 Encounters:  02/15/16 112 lb (50.803 kg)  12/07/15 117 lb (53.071 kg)  08/16/15 112 lb (50.803 kg)    Physical Exam  Constitutional: She appears well-developed. No distress.  HENT:  Head: Normocephalic.  Right Ear: External ear normal.  Left Ear: External ear normal.  Nose: Nose normal.  Mouth/Throat: Oropharynx is clear and moist.  Eyes: Conjunctivae are normal. Pupils are equal, round, and reactive to light. Right eye exhibits no discharge. Left eye exhibits no discharge.  Neck: Normal range of motion. Neck supple. No JVD present. No tracheal deviation present. No thyromegaly present.  Cardiovascular: Normal rate, regular rhythm and normal heart sounds.   Pulmonary/Chest: No stridor. No respiratory distress. She has no wheezes.  Abdominal: Soft. Bowel sounds are normal. She exhibits no distension and no mass. There is no tenderness. There is no rebound and no guarding.  Musculoskeletal: She exhibits no edema or tenderness.  Lymphadenopathy:    She has no cervical adenopathy.  Neurological: She displays normal reflexes. No cranial nerve deficit. She exhibits normal muscle tone. Coordination normal.  Skin: No rash noted. No erythema.  Psychiatric: She has a normal mood and affect. Her behavior is normal. Judgment and thought content normal.  SKs  Lab Results  Component Value Date   WBC 5.3 02/12/2015   HGB 13.3 02/12/2015   HCT 38.7 02/12/2015   PLT 241.0 02/12/2015   GLUCOSE 97 02/12/2015   CHOL 228* 02/12/2015   TRIG 65.0  02/12/2015   HDL 95.90 02/12/2015   LDLDIRECT 109.2 02/05/2012   LDLCALC 119* 02/12/2015   ALT 14 02/12/2015   AST 19 02/12/2015   NA 134* 02/12/2015   K 4.5 02/12/2015   CL 98 02/12/2015   CREATININE 0.62 02/12/2015   BUN 14 02/12/2015   CO2 29 02/12/2015   TSH 1.55 02/12/2015    Ct Head Wo Contrast  10/18/2014  CLINICAL DATA:  Left-sided abdominal pain and left-sided paresthesias. EXAM: CT HEAD WITHOUT  CONTRAST TECHNIQUE: Contiguous axial images were obtained from the base of the skull through the vertex without intravenous contrast. COMPARISON:  07/09/2012 FINDINGS: Stable moderate small vessel disease in the periventricular Blanchett matter. The brain demonstrates no evidence of hemorrhage, infarction, edema, mass effect, extra-axial fluid collection, hydrocephalus or mass lesion. The skull is unremarkable. IMPRESSION: Stable small vessel disease.  No acute findings. Electronically Signed   By: Irish LackGlenn  Yamagata M.D.   On: 10/18/2014 14:33   Ct Renal Stone Study  10/18/2014  CLINICAL DATA:  Initial encounter for left-sided pain that radiates around to the back off and on for about 1 month but became worse last night. EXAM: CT ABDOMEN AND PELVIS WITHOUT CONTRAST TECHNIQUE: Multidetector CT imaging of the abdomen and pelvis was performed following the standard protocol without IV contrast. COMPARISON:  02/08/2012 FINDINGS: Lower chest: Stable 2 mm right lower lobe pulmonary nodule, consistent with benign process such as scarring. There is some chronic atelectasis or linear scarring in the left base. Hepatobiliary: No focal abnormality in the liver on this study without intravenous contrast. No evidence for hepatomegaly. There is no evidence for gallstones, gallbladder wall thickening, or pericholecystic fluid. No intrahepatic or extrahepatic biliary dilation. Pancreas: No focal mass lesion. No dilatation of the main duct. No intraparenchymal cyst. No peripancreatic edema. Spleen: No splenomegaly. No focal mass lesion. Adrenals/Urinary Tract: No adrenal nodule or mass. 2 mm nonobstructing stones are seen in the upper pole and interpolar region of the right kidney. No evidence for right-sided hydroureteronephrosis. No right ureteral stone. Mild left hydroureteronephrosis is evident. Multiple stones are seen in the left kidney ranging in size from a 2 mm stone in the upper pole to a 5 x 9 x 5 mm stone in the left renal  pelvis. Perienteric edema is seen down to the level of the pelvis. A 7 x 4 x 4 mm stone is identified at the left ureterovesical junction. The bladder is decompressed. Stomach/Bowel: Stomach is nondistended. No gastric wall thickening. No evidence of outlet obstruction. The duodenum does not cross the midline and small bowel remains clustered in the right abdomen. The cecum is not positioned in the right lower quadrant but is seen in the midline central pelvis with most of the colon located in the left abdomen. No evidence for small bowel obstruction. No gross colonic abnormality is evident. The appendix is normal. Vascular/Lymphatic: Atherosclerotic calcification is noted in the wall of the abdominal aorta without aneurysm. No lymphadenopathy in the abdomen. No pelvic sidewall lymphadenopathy. Reproductive: Uterus is not clearly visualized and may be surgically absent. There is no adnexal mass. Other: No intraperitoneal free fluid. Musculoskeletal: Bone windows reveal no worrisome lytic or sclerotic osseous lesions. IMPRESSION: Bilateral nephrolithiasis, left greater than right, with a 7 x 4 x 4 mm stone at the left ureterovesical junction causing mild left hydroureteronephrosis. Intestinal malrotation.  No evidence for obstruction. Electronically Signed   By: Kennith CenterEric  Mansell M.D.   On: 10/18/2014 14:39    Assessment & Plan:  There are no diagnoses linked to this encounter. I have discontinued Ms. Huser's promethazine-codeine and azithromycin. I am also having her maintain her multivitamin, cholecalciferol, simvastatin, estradiol, and valsartan.  Meds ordered this encounter  Medications  . valsartan (DIOVAN) 160 MG tablet    Sig: Take 1 tablet (160 mg total) by mouth daily.    Dispense:  90 tablet    Refill:  3    Please dispense Brand Name Diovan     Follow-up: No Follow-up on file.  Sonda Primes, MD

## 2016-02-16 LAB — HEPATITIS C ANTIBODY: HCV Ab: NEGATIVE

## 2016-04-04 LAB — HM MAMMOGRAPHY

## 2016-04-06 ENCOUNTER — Encounter: Payer: Self-pay | Admitting: Internal Medicine

## 2016-04-21 ENCOUNTER — Ambulatory Visit (INDEPENDENT_AMBULATORY_CARE_PROVIDER_SITE_OTHER): Payer: Medicare Other | Admitting: Gynecology

## 2016-04-21 ENCOUNTER — Encounter: Payer: Self-pay | Admitting: Gynecology

## 2016-04-21 VITALS — BP 122/78 | Ht 65.0 in | Wt 109.0 lb

## 2016-04-21 DIAGNOSIS — N952 Postmenopausal atrophic vaginitis: Secondary | ICD-10-CM

## 2016-04-21 DIAGNOSIS — Z01419 Encounter for gynecological examination (general) (routine) without abnormal findings: Secondary | ICD-10-CM

## 2016-04-21 DIAGNOSIS — M8580 Other specified disorders of bone density and structure, unspecified site: Secondary | ICD-10-CM | POA: Diagnosis not present

## 2016-04-21 DIAGNOSIS — Z79890 Hormone replacement therapy (postmenopausal): Secondary | ICD-10-CM

## 2016-04-21 DIAGNOSIS — R252 Cramp and spasm: Secondary | ICD-10-CM

## 2016-04-21 MED ORDER — ESTRADIOL 0.0375 MG/24HR TD PTTW: 1.0000 | MEDICATED_PATCH | TRANSDERMAL | Status: DC

## 2016-04-21 NOTE — Patient Instructions (Signed)

## 2016-04-21 NOTE — Progress Notes (Signed)
Brittany Mathis 02-10-1946 [id number]        70 y.o.  [name]  for breast and pelvic exam. Several issues noted below.  Past medical history,surgical history, problem list, medications, allergies, family history and social history were all reviewed and documented as reviewed in the EPIC chart.  ROS:  Performed with pertinent positives and negatives included in the history, assessment and plan.   Additional significant findings :  Leg cramps   Exam: Bari MantisKim Alexis assistant Filed Vitals:   04/21/16 0911  BP: 122/78  Height: 5\' 5"  (1.651 m)  Weight: 109 lb (49.442 kg)   General appearance:  Normal affect, orientation and appearance. Skin: Grossly normal excepting numerous classic seborrheic keratoses covering back chest and abdomen HEENT: Without gross lesions.  No cervical or supraclavicular adenopathy. Thyroid normal.  Lungs:  Clear without wheezing, rales or rhonchi Cardiac: RR, without RMG Abdominal:  Soft, nontender, without masses, guarding, rebound, organomegaly or hernia Breasts:  Examined lying and sitting without masses, retractions, discharge or axillary adenopathy. Pelvic:  Ext/BUS/Vagina With atrophic changes  Adnexa without masses or tenderness    Anus and perineum normal   Rectovaginal normal sphincter tone without palpated masses or tenderness.    Assessment/Plan:  70 y.o. 301P1001 female for breast and pelvic exam.   1. Postmenopausal/atrophic genital changes/ERT.  Continues on Vivelle 0.0375 mg patch. Tried stopping but had unacceptable flushes and vaginal dryness. Notes on the patch no dryness or symptoms. Does have some mild dyspareunia though with intercourse. As a shared patient/physician discussion I reviewed the most recent 2017 [employer] guidelines on HRT. I reviewed the risks including thrombosis such as stroke heart attack DVT and breast cancer issues. Benefits particular he started early include bone health which is an issue with her and possible cardiovascular  benefits. After a lengthy discussion the patient wants to continue and I refilled her 1 year. I also recommended OTC lubricants with intercourse. We discussed possible vaginal estrogen supplementation but does not appear to be an issue enough to warrant. Brittany follow up if becomes more of an issue. 2. Osteopenia. DEXA 03/2014 T score -1.9 FRAX 10%/1.9%. Stable from DEXA 2013. We reviewed options to repeat now versus waiting another year or 2. As she is supplementing estrogen and active as far as walking we both agree to wait 1 more year. Increased calcium vitamin D reviewed. 3. Leg cramps. Patient notes leg cramps when she is walking around the house when she wears sandals. When she wears support shoes is not having this as an issue. Exam is normal overall. Does take Zocor but has been on this for years. I think this is more musculoskeletal related and as long as she is having relief wearing a better support shoes and she Brittany continue to do so. If it would worsen or persist despite changing shoes then she'll follow up with her primary physician. 4. Pap smear 2012. No Pap smear done today. No history of significant abnormal Pap smears. We both agree to stop screening based on age and hysterectomy history per current screening guidelines. 5. Colonoscopy 2009. Patient is going to call Dr. Jennye BoroughsMedoff's office to see when she is to repeat her colonoscopy. 6. Health maintenance. No routine lab work done as patient does this elsewhere. Follow up 1 year, sooner as needed.  15 minutes of my time in excess of her breast and pelvic exam was spent in direct face to face counseling and coordination of care in regards to her problems of new 2017  NAMS HRT guidelines, vaginal dryness options, leg cramps.   Dara LordsFONTAINE,Meka Lewan P MD, 9:35 AM 04/21/2016

## 2016-08-17 ENCOUNTER — Ambulatory Visit: Payer: Medicare Other | Admitting: Internal Medicine

## 2016-08-21 ENCOUNTER — Ambulatory Visit (INDEPENDENT_AMBULATORY_CARE_PROVIDER_SITE_OTHER): Payer: Medicare Other | Admitting: Internal Medicine

## 2016-08-21 ENCOUNTER — Encounter: Payer: Self-pay | Admitting: Internal Medicine

## 2016-08-21 VITALS — BP 130/80 | HR 67 | Wt 106.0 lb

## 2016-08-21 DIAGNOSIS — I10 Essential (primary) hypertension: Secondary | ICD-10-CM | POA: Diagnosis not present

## 2016-08-21 DIAGNOSIS — E785 Hyperlipidemia, unspecified: Secondary | ICD-10-CM | POA: Diagnosis not present

## 2016-08-21 DIAGNOSIS — Z0000 Encounter for general adult medical examination without abnormal findings: Secondary | ICD-10-CM | POA: Diagnosis not present

## 2016-08-21 DIAGNOSIS — N951 Menopausal and female climacteric states: Secondary | ICD-10-CM

## 2016-08-21 NOTE — Progress Notes (Signed)
Subjective:  Patient ID: Brittany FusiAnn H Herald, female    DOB: 1946-03-29  Age: 70 y.o. MRN: [id number]  CC: No chief complaint on file.   HPI Brittany Mathis presents for HTN, dyslipidemia, vit D def f/u  Outpatient Medications Prior to Visit  Medication Sig Dispense Refill  . cholecalciferol (VITAMIN D) 1000 UNITS tablet Take 1,000 Units by mouth daily.      [name]. estradiol (VIVELLE-DOT) 0.0375 MG/24HR Place 1 patch onto the skin 2 (two) times a week. 24 patch 4  . Multiple Vitamin (MULTIVITAMIN) tablet Take 2 tablets by mouth daily.     . simvastatin (ZOCOR) 20 MG tablet Take 1 tablet by mouth  every night at bedtime 90 tablet 3  . valsartan (DIOVAN) 160 MG tablet Take 1 tablet (160 mg total) by mouth daily. 90 tablet 3   No facility-administered medications prior to visit.     ROS Review of Systems  Constitutional: Negative for activity change, appetite change, chills, fatigue and unexpected weight change.  HENT: Negative for congestion, mouth sores and sinus pressure.   Eyes: Negative for visual disturbance.  Respiratory: Negative for cough and chest tightness.   Gastrointestinal: Negative for abdominal pain and nausea.  Genitourinary: Negative for difficulty urinating, frequency and vaginal pain.  Musculoskeletal: Negative for back pain and gait problem.  Skin: Negative for pallor and rash.  Neurological: Negative for dizziness, tremors, weakness, numbness and headaches.  Psychiatric/Behavioral: Negative for confusion, sleep disturbance and suicidal ideas. The patient is not nervous/anxious.     Objective:  BP (!) 148/90   Pulse 67   Wt 106 lb (48.1 kg)   SpO2 98%   BMI 17.64 kg/m   BP Readings from Last 3 Encounters:  08/21/16 (!) 148/90  04/21/16 122/78  02/15/16 116/70    Wt Readings from Last 3 Encounters:  08/21/16 106 lb (48.1 kg)  04/21/16 109 lb (49.4 kg)  02/15/16 112 lb (50.8 kg)    Physical Exam  Constitutional: She appears well-developed. No distress.  HENT:   Head: Normocephalic.  Right Ear: External ear normal.  Left Ear: External ear normal.  Nose: Nose normal.  Mouth/Throat: Oropharynx is clear and moist.  Eyes: Conjunctivae are normal. Pupils are equal, round, and reactive to light. Right eye exhibits no discharge. Left eye exhibits no discharge.  Neck: Normal range of motion. Neck supple. No JVD present. No tracheal deviation present. No thyromegaly present.  Cardiovascular: Normal rate, regular rhythm and normal heart sounds.   Pulmonary/Chest: No stridor. No respiratory distress. She has no wheezes.  Abdominal: Soft. Bowel sounds are normal. She exhibits no distension and no mass. There is no tenderness. There is no rebound and no guarding.  Musculoskeletal: She exhibits no edema or tenderness.  Lymphadenopathy:    She has no cervical adenopathy.  Neurological: She displays normal reflexes. No cranial nerve deficit. She exhibits normal muscle tone. Coordination normal.  Skin: No rash noted. No erythema.  Psychiatric: She has a normal mood and affect. Her behavior is normal. Judgment and thought content normal.    Lab Results  Component Value Date   WBC 5.3 02/15/2016   HGB 13.6 02/15/2016   HCT 40.2 02/15/2016   PLT 265.0 02/15/2016   GLUCOSE 100 (H) 02/15/2016   CHOL 229 (H) 02/15/2016   TRIG 72.0 02/15/2016   HDL 103.40 02/15/2016   LDLDIRECT 109.2 02/05/2012   LDLCALC 111 (H) 02/15/2016   ALT 15 02/15/2016   AST 20 02/15/2016   NA 136 02/15/2016  K 4.8 02/15/2016   CL 99 02/15/2016   CREATININE 0.67 02/15/2016   BUN 13 02/15/2016   CO2 28 02/15/2016   TSH 1.77 02/15/2016    Ct Head Wo Contrast  Result Date: 10/18/2014 CLINICAL DATA:  Left-sided abdominal pain and left-sided paresthesias. EXAM: CT HEAD WITHOUT CONTRAST TECHNIQUE: Contiguous axial images were obtained from the base of the skull through the vertex without intravenous contrast. COMPARISON:  07/09/2012 FINDINGS: Stable moderate small vessel disease in the  periventricular [place] matter. The brain demonstrates no evidence of hemorrhage, infarction, edema, mass effect, extra-axial fluid collection, hydrocephalus or mass lesion. The skull is unremarkable. IMPRESSION: Stable small vessel disease.  No acute findings. Electronically Signed   By: Irish LackGlenn  Yamagata M.D.   On: 10/18/2014 14:33   Ct Renal Stone Study  Result Date: 10/18/2014 CLINICAL DATA:  Initial encounter for left-sided pain that radiates around to the back off and on for about 1 month but became worse last night. EXAM: CT ABDOMEN AND PELVIS WITHOUT CONTRAST TECHNIQUE: Multidetector CT imaging of the abdomen and pelvis was performed following the standard protocol without IV contrast. COMPARISON:  02/08/2012 FINDINGS: Lower chest: Stable 2 mm right lower lobe pulmonary nodule, consistent with benign process such as scarring. There is some chronic atelectasis or linear scarring in the left base. Hepatobiliary: No focal abnormality in the liver on this study without intravenous contrast. No evidence for hepatomegaly. There is no evidence for gallstones, gallbladder wall thickening, or pericholecystic fluid. No intrahepatic or extrahepatic biliary dilation. Pancreas: No focal mass lesion. No dilatation of the main duct. No intraparenchymal cyst. No peripancreatic edema. Spleen: No splenomegaly. No focal mass lesion. Adrenals/Urinary Tract: No adrenal nodule or mass. 2 mm nonobstructing stones are seen in the upper pole and interpolar region of the right kidney. No evidence for right-sided hydroureteronephrosis. No right ureteral stone. Mild left hydroureteronephrosis is evident. Multiple stones are seen in the left kidney ranging in size from a 2 mm stone in the upper pole to a 5 x 9 x 5 mm stone in the left renal pelvis. Perienteric edema is seen down to the level of the pelvis. A 7 x 4 x 4 mm stone is identified at the left ureterovesical junction. The bladder is decompressed. Stomach/Bowel: Stomach is  nondistended. No gastric wall thickening. No evidence of outlet obstruction. The duodenum does not cross the midline and small bowel remains clustered in the right abdomen. The cecum is not positioned in the right lower quadrant but is seen in the midline central pelvis with most of the colon located in the left abdomen. No evidence for small bowel obstruction. No gross colonic abnormality is evident. The appendix is normal. Vascular/Lymphatic: Atherosclerotic calcification is noted in the wall of the abdominal aorta without aneurysm. No lymphadenopathy in the abdomen. No pelvic sidewall lymphadenopathy. Reproductive: Uterus is not clearly visualized and may be surgically absent. There is no adnexal mass. Other: No intraperitoneal free fluid. Musculoskeletal: Bone windows reveal no worrisome lytic or sclerotic osseous lesions. IMPRESSION: Bilateral nephrolithiasis, left greater than right, with a 7 x 4 x 4 mm stone at the left ureterovesical junction causing mild left hydroureteronephrosis. Intestinal malrotation.  No evidence for obstruction. Electronically Signed   By: Kennith CenterEric  Mansell M.D.   On: 10/18/2014 14:39    Assessment & Plan:   There are no diagnoses linked to this encounter. I am having Brittany Mathis maintain her multivitamin, cholecalciferol, valsartan, simvastatin, and estradiol.  No orders of the defined types were placed in  this encounter.    Follow-up: No Follow-up on file.  Walker Kehr, MD

## 2016-08-21 NOTE — Progress Notes (Signed)
Pre visit review using our clinic review tool, if applicable. No additional management support is needed unless otherwise documented below in the visit note. 

## 2016-08-21 NOTE — Assessment & Plan Note (Signed)
On Vivelle

## 2016-08-21 NOTE — Assessment & Plan Note (Signed)
On Simvastatin 

## 2016-08-21 NOTE — Assessment & Plan Note (Signed)
ETOH <2 drinks per pt On Valsartan

## 2016-08-22 ENCOUNTER — Telehealth: Payer: Self-pay | Admitting: *Deleted

## 2016-08-22 NOTE — Telephone Encounter (Signed)
None of those are estrogen replacement. They are all psychoactive antidepressants work anxiolytic medications. I do not think they are equivalent

## 2016-08-22 NOTE — Telephone Encounter (Signed)
Pt currently on vivelle-dot twice weekly patch, medication was denied by [employer] stating she will need to try and fail formulary medication such as citalopram, fluoxetine, gabapentin, venlafaxine. If listed medications are not an option, we can appeal denied with letter. Please advise

## 2016-08-23 ENCOUNTER — Encounter: Payer: Self-pay | Admitting: *Deleted

## 2016-08-23 NOTE — Telephone Encounter (Signed)
I faxed letter to appeals department, will wait to hear from them, I tried to call the patient and relay but her phone had no voicemail to leave a message.

## 2016-08-23 NOTE — Telephone Encounter (Signed)
Pt aware with the below.

## 2016-08-31 NOTE — Telephone Encounter (Signed)
Medication approved by [employer] via cover my meds

## 2016-09-01 NOTE — Telephone Encounter (Signed)
Until Oct 15, 2017

## 2016-10-16 HISTORY — PX: OTHER SURGICAL HISTORY: SHX169

## 2017-02-13 ENCOUNTER — Encounter: Payer: Medicare Other | Admitting: Internal Medicine

## 2017-02-21 ENCOUNTER — Other Ambulatory Visit (INDEPENDENT_AMBULATORY_CARE_PROVIDER_SITE_OTHER): Payer: Medicare Other

## 2017-02-21 ENCOUNTER — Ambulatory Visit (INDEPENDENT_AMBULATORY_CARE_PROVIDER_SITE_OTHER): Payer: Medicare Other | Admitting: Internal Medicine

## 2017-02-21 ENCOUNTER — Encounter: Payer: Self-pay | Admitting: Internal Medicine

## 2017-02-21 VITALS — BP 144/86 | HR 64 | Temp 97.9°F | Ht 65.0 in | Wt 109.0 lb

## 2017-02-21 DIAGNOSIS — E785 Hyperlipidemia, unspecified: Secondary | ICD-10-CM

## 2017-02-21 DIAGNOSIS — Z0000 Encounter for general adult medical examination without abnormal findings: Secondary | ICD-10-CM

## 2017-02-21 LAB — LIPID PANEL
CHOL/HDL RATIO: 2
Cholesterol: 231 mg/dL — ABNORMAL HIGH (ref 0–200)
HDL: 112.3 mg/dL (ref >39.00)
LDL Cholesterol: 106 mg/dL — ABNORMAL HIGH (ref 0–99)
NONHDL: 118.84
Triglycerides: 63 mg/dL (ref 0.0–149.0)
VLDL: 12.6 mg/dL (ref 0.0–40.0)

## 2017-02-21 LAB — URINALYSIS
KETONES UR: 15 — AB
Leukocytes, UA: NEGATIVE
Nitrite: NEGATIVE
SPECIFIC GRAVITY, URINE: 1.025 (ref 1.000–1.030)
Total Protein, Urine: NEGATIVE
URINE GLUCOSE: NEGATIVE
UROBILINOGEN UA: 0.2 (ref 0.0–1.0)
pH: 6 (ref 5.0–8.0)

## 2017-02-21 LAB — BASIC METABOLIC PANEL
BUN: 13 mg/dL (ref 6–23)
CALCIUM: 9.9 mg/dL (ref 8.4–10.5)
CO2: 25 mEq/L (ref 19–32)
CREATININE: 0.63 mg/dL (ref 0.40–1.20)
Chloride: 98 mEq/L (ref 96–112)
GFR: 98.62 mL/min (ref >60.00)
Glucose, Bld: 91 mg/dL (ref 70–99)
Potassium: 5.1 mEq/L (ref 3.5–5.1)
SODIUM: 132 meq/L — AB (ref 135–145)

## 2017-02-21 LAB — CBC WITH DIFFERENTIAL/PLATELET
BASOS ABS: 0.1 10*3/uL (ref 0.0–0.1)
Basophils Relative: 1.1 % (ref 0.0–3.0)
EOS ABS: 0.4 10*3/uL (ref 0.0–0.7)
Eosinophils Relative: 6.9 % — ABNORMAL HIGH (ref 0.0–5.0)
HEMATOCRIT: 39.8 % (ref 36.0–46.0)
HEMOGLOBIN: 13.5 g/dL (ref 12.0–15.0)
LYMPHS PCT: 26.5 % (ref 12.0–46.0)
Lymphs Abs: 1.5 10*3/uL (ref 0.7–4.0)
MCHC: 33.8 g/dL (ref 30.0–36.0)
MCV: 87.8 fl (ref 78.0–100.0)
MONOS PCT: 11.1 % (ref 3.0–12.0)
Monocytes Absolute: 0.6 10*3/uL (ref 0.1–1.0)
Neutro Abs: 3 10*3/uL (ref 1.4–7.7)
Neutrophils Relative %: 54.4 % (ref 43.0–77.0)
Platelets: 236 10*3/uL (ref 150.0–400.0)
RBC: 4.54 Mil/uL (ref 3.87–5.11)
RDW: 12.5 % (ref 11.5–15.5)
WBC: 5.5 10*3/uL (ref 4.0–10.5)

## 2017-02-21 LAB — HEPATIC FUNCTION PANEL
ALK PHOS: 47 U/L (ref 39–117)
ALT: 16 U/L (ref 0–35)
AST: 22 U/L (ref 0–37)
Albumin: 4.6 g/dL (ref 3.5–5.2)
BILIRUBIN DIRECT: 0.1 mg/dL (ref 0.0–0.3)
TOTAL PROTEIN: 7.3 g/dL (ref 6.0–8.3)
Total Bilirubin: 0.5 mg/dL (ref 0.2–1.2)

## 2017-02-21 LAB — TSH: TSH: 1.95 u[IU]/mL (ref 0.35–4.50)

## 2017-02-21 MED ORDER — SIMVASTATIN 20 MG PO TABS: ORAL_TABLET | ORAL | 3 refills | Status: DC

## 2017-02-21 MED ORDER — VALSARTAN 160 MG PO TABS: 160.0000 mg | ORAL_TABLET | Freq: Every day | ORAL | 3 refills | Status: DC

## 2017-02-21 NOTE — Assessment & Plan Note (Signed)
Here for medicare wellness/physical  Diet: heart healthy  Physical activity: not sedentary  Depression/mood screen: negative  Hearing: intact to whispered voice  Visual acuity: grossly normal w/glasses, performs annual eye exam  ADLs: capable  Fall risk: low Home safety: good  Cognitive evaluation: intact to orientation, naming, recall and repetition  EOL planning: adv directives, full code/ I agree  I have personally reviewed and have noted  1. The patient's medical, surgical and social history  2. Their use of alcohol, tobacco or illicit drugs  3. Their current medications and supplements  4. The patient's functional ability including ADL's, fall risks, home safety risks and hearing or visual impairment.  5. Diet and physical activities  6. Evidence for depression or mood disorders 7. The roster of all physicians providing medical care to patient - is listed in the Snapshot section of the chart and reviewed today.    Today patient counseled on age appropriate routine health concerns for screening and prevention, each reviewed and up to date or declined. Immunizations reviewed and up to date or declined. Labs ordered and reviewed. Risk factors for depression reviewed and negative. Hearing function and visual acuity are intact. ADLs screened and addressed as needed. Functional ability and level of safety reviewed and appropriate. Education, counseling and referrals performed based on assessed risks today. Patient provided with a copy of personalized plan for preventive services.   Dr Ileana RoupFontaine Mammo - yearly Eye exam Dr Randon GoldsmithLyles DEXA w/Dr Audie BoxFontaine

## 2017-02-21 NOTE — Progress Notes (Signed)
Subjective:  Patient ID: Brittany Mathis, female    DOB: Mar 03, 1946  Age: 71 y.o. MRN: [id number]  CC: No chief complaint on file.   HPI Brittany Mathis presents for a well exam  Outpatient Medications Prior to Visit  Medication Sig Dispense Refill  . cholecalciferol (VITAMIN D) 1000 UNITS tablet Take 1,000 Units by mouth daily.      [name] estradiol (VIVELLE-DOT) 0.0375 MG/24HR Place 1 patch onto the skin 2 (two) times a week. 24 patch 4  . Multiple Vitamin (MULTIVITAMIN) tablet Take 2 tablets by mouth daily.     . simvastatin (ZOCOR) 20 MG tablet Take 1 tablet by mouth  every night at bedtime 90 tablet 3  . valsartan (DIOVAN) 160 MG tablet Take 1 tablet (160 mg total) by mouth daily. 90 tablet 3   No facility-administered medications prior to visit.     ROS Review of Systems  Constitutional: Negative for activity change, appetite change, chills, fatigue and unexpected weight change.  HENT: Negative for congestion, mouth sores and sinus pressure.   Eyes: Negative for visual disturbance.  Respiratory: Negative for cough and chest tightness.   Gastrointestinal: Negative for abdominal pain and nausea.  Genitourinary: Negative for difficulty urinating, frequency and vaginal pain.  Musculoskeletal: Negative for back pain and gait problem.  Skin: Negative for pallor and rash.  Neurological: Negative for dizziness, tremors, weakness, numbness and headaches.  Psychiatric/Behavioral: Negative for confusion, dysphoric mood, sleep disturbance and suicidal ideas.    Objective:  BP (!) 144/86 (BP Location: Left Arm, Patient Position: Sitting, Cuff Size: Normal)   Pulse 64   Temp 97.9 F (36.6 C) (Oral)   Ht 5\' 5"  (1.651 m)   Wt 109 lb 0.6 oz (49.5 kg)   SpO2 98%   BMI 18.15 kg/m   BP Readings from Last 3 Encounters:  02/21/17 (!) 144/86  08/21/16 130/80  04/21/16 122/78    Wt Readings from Last 3 Encounters:  02/21/17 109 lb 0.6 oz (49.5 kg)  08/21/16 106 lb (48.1 kg)  04/21/16 109 lb  (49.4 kg)    Physical Exam  Constitutional: She appears well-developed. No distress.  HENT:  Head: Normocephalic.  Right Ear: External ear normal.  Left Ear: External ear normal.  Nose: Nose normal.  Mouth/Throat: Oropharynx is clear and moist.  Eyes: Conjunctivae are normal. Pupils are equal, round, and reactive to light. Right eye exhibits no discharge. Left eye exhibits no discharge.  Neck: Normal range of motion. Neck supple. No JVD present. No tracheal deviation present. No thyromegaly present.  Cardiovascular: Normal rate, regular rhythm and normal heart sounds.   Pulmonary/Chest: No stridor. No respiratory distress. She has no wheezes.  Abdominal: Soft. Bowel sounds are normal. She exhibits no distension and no mass. There is no tenderness. There is no rebound and no guarding.  Musculoskeletal: She exhibits no edema or tenderness.  Lymphadenopathy:    She has no cervical adenopathy.  Neurological: She displays normal reflexes. No cranial nerve deficit. She exhibits normal muscle tone. Coordination normal.  Skin: No rash noted. No erythema.  Psychiatric: She has a normal mood and affect. Her behavior is normal. Judgment and thought content normal.    Lab Results  Component Value Date   WBC 5.3 02/15/2016   HGB 13.6 02/15/2016   HCT 40.2 02/15/2016   PLT 265.0 02/15/2016   GLUCOSE 100 (H) 02/15/2016   CHOL 229 (H) 02/15/2016   TRIG 72.0 02/15/2016   HDL 103.40 02/15/2016   LDLDIRECT 109.2 02/05/2012  LDLCALC 111 (H) 02/15/2016   ALT 15 02/15/2016   AST 20 02/15/2016   NA 136 02/15/2016   K 4.8 02/15/2016   CL 99 02/15/2016   CREATININE 0.67 02/15/2016   BUN 13 02/15/2016   CO2 28 02/15/2016   TSH 1.77 02/15/2016    Ct Head Wo Contrast  Result Date: 10/18/2014 CLINICAL DATA:  Left-sided abdominal pain and left-sided paresthesias. EXAM: CT HEAD WITHOUT CONTRAST TECHNIQUE: Contiguous axial images were obtained from the base of the skull through the vertex without  intravenous contrast. COMPARISON:  07/09/2012 FINDINGS: Stable moderate small vessel disease in the periventricular [place] matter. The brain demonstrates no evidence of hemorrhage, infarction, edema, mass effect, extra-axial fluid collection, hydrocephalus or mass lesion. The skull is unremarkable. IMPRESSION: Stable small vessel disease.  No acute findings. Electronically Signed   By: Irish Lack M.D.   On: 10/18/2014 14:33   Ct Renal Stone Study  Result Date: 10/18/2014 CLINICAL DATA:  Initial encounter for left-sided pain that radiates around to the back off and on for about 1 month but became worse last night. EXAM: CT ABDOMEN AND PELVIS WITHOUT CONTRAST TECHNIQUE: Multidetector CT imaging of the abdomen and pelvis was performed following the standard protocol without IV contrast. COMPARISON:  02/08/2012 FINDINGS: Lower chest: Stable 2 mm right lower lobe pulmonary nodule, consistent with benign process such as scarring. There is some chronic atelectasis or linear scarring in the left base. Hepatobiliary: No focal abnormality in the liver on this study without intravenous contrast. No evidence for hepatomegaly. There is no evidence for gallstones, gallbladder wall thickening, or pericholecystic fluid. No intrahepatic or extrahepatic biliary dilation. Pancreas: No focal mass lesion. No dilatation of the main duct. No intraparenchymal cyst. No peripancreatic edema. Spleen: No splenomegaly. No focal mass lesion. Adrenals/Urinary Tract: No adrenal nodule or mass. 2 mm nonobstructing stones are seen in the upper pole and interpolar region of the right kidney. No evidence for right-sided hydroureteronephrosis. No right ureteral stone. Mild left hydroureteronephrosis is evident. Multiple stones are seen in the left kidney ranging in size from a 2 mm stone in the upper pole to a 5 x 9 x 5 mm stone in the left renal pelvis. Perienteric edema is seen down to the level of the pelvis. A 7 x 4 x 4 mm stone is  identified at the left ureterovesical junction. The bladder is decompressed. Stomach/Bowel: Stomach is nondistended. No gastric wall thickening. No evidence of outlet obstruction. The duodenum does not cross the midline and small bowel remains clustered in the right abdomen. The cecum is not positioned in the right lower quadrant but is seen in the midline central pelvis with most of the colon located in the left abdomen. No evidence for small bowel obstruction. No gross colonic abnormality is evident. The appendix is normal. Vascular/Lymphatic: Atherosclerotic calcification is noted in the wall of the abdominal aorta without aneurysm. No lymphadenopathy in the abdomen. No pelvic sidewall lymphadenopathy. Reproductive: Uterus is not clearly visualized and may be surgically absent. There is no adnexal mass. Other: No intraperitoneal free fluid. Musculoskeletal: Bone windows reveal no worrisome lytic or sclerotic osseous lesions. IMPRESSION: Bilateral nephrolithiasis, left greater than right, with a 7 x 4 x 4 mm stone at the left ureterovesical junction causing mild left hydroureteronephrosis. Intestinal malrotation.  No evidence for obstruction. Electronically Signed   By: Kennith Center M.D.   On: 10/18/2014 14:39    Assessment & Plan:   There are no diagnoses linked to this encounter. I am  having Ms. Blanke maintain her multivitamin, cholecalciferol, valsartan, simvastatin, and estradiol.  No orders of the defined types were placed in this encounter.    Follow-up: No Follow-up on file.  Sonda PrimesAlex Plotnikov, MD

## 2017-04-03 ENCOUNTER — Telehealth: Payer: Self-pay | Admitting: Internal Medicine

## 2017-04-03 NOTE — Telephone Encounter (Signed)
Patient Name: Marshella Przybylski  DOB: [birth year]    Initial Comment Caller states she is having trouble swallowing, hacking cough, exhaustion, trouble breathing, weakness and light headed   Nurse Assessment  Nurse: Sherilyn CooterHenry, RN, Thurmond ButtsWade Date/Time (Eastern Time): 04/03/2017 2:46:19 PM  Confirm and document reason for call. If symptomatic, describe symptoms. ---Caller states she is having trouble swallowing, hacking cough, exhaustion, trouble breathing, weakness and light headed. She is having SOB with exertion. She states that she now has the SOB even when resting. Denies her heart racing with the SOB. She has a burning in her lungs when she gets SOB. She has been losing weight and has finally stabilized. She is speaking in complete sentences.  Does the patient have any new or worsening symptoms? ---Yes  Will a triage be completed? ---Yes  Related visit to physician within the last 2 weeks? ---No  Does the PT have any chronic conditions? (i.e. diabetes, asthma, etc.) ---Yes  List chronic conditions. ---HTN,  Is this a behavioral health or substance abuse call? ---No     Guidelines    Guideline Title Affirmed Question Affirmed Notes  Breathing Difficulty [1] MILD difficulty breathing (e.g., minimal/no SOB at rest, SOB with walking, pulse <100) AND [2] NEW-onset or WORSE than normal    Final Disposition User   See Physician within 4 Hours (or PCP triage) Sherilyn CooterHenry, RN, Thurmond ButtsWade    Comments  No appointments were available for the remainder of today. Caller asked for an appointment tomorrow morning. The only availability was for Dr. Oliver BarreJames John at 11:45 tomorrow. She wants that appointment slot. I advised her that if she feels she is getting worse, we are here 24/hrs/day and to call back. She verbalized understanding.   Referrals  REFERRED TO PCP OFFICE   Disagree/Comply: Comply

## 2017-04-04 ENCOUNTER — Ambulatory Visit (INDEPENDENT_AMBULATORY_CARE_PROVIDER_SITE_OTHER): Payer: Medicare Other | Admitting: Internal Medicine

## 2017-04-04 ENCOUNTER — Encounter: Payer: Self-pay | Admitting: Internal Medicine

## 2017-04-04 ENCOUNTER — Other Ambulatory Visit (INDEPENDENT_AMBULATORY_CARE_PROVIDER_SITE_OTHER): Payer: Medicare Other

## 2017-04-04 ENCOUNTER — Ambulatory Visit (INDEPENDENT_AMBULATORY_CARE_PROVIDER_SITE_OTHER)
Admission: RE | Admit: 2017-04-04 | Discharge: 2017-04-04 | Disposition: A | Discharge status: Home / Self Care | Payer: Medicare Other | Source: Ambulatory Visit | Attending: Internal Medicine | Admitting: Internal Medicine

## 2017-04-04 VITALS — BP 122/84 | HR 81 | Ht 65.0 in | Wt 102.0 lb

## 2017-04-04 DIAGNOSIS — R634 Abnormal weight loss: Secondary | ICD-10-CM

## 2017-04-04 DIAGNOSIS — R5383 Other fatigue: Secondary | ICD-10-CM

## 2017-04-04 DIAGNOSIS — R079 Chest pain, unspecified: Secondary | ICD-10-CM

## 2017-04-04 DIAGNOSIS — R0600 Dyspnea, unspecified: Secondary | ICD-10-CM

## 2017-04-04 LAB — URINALYSIS, ROUTINE W REFLEX MICROSCOPIC
Bilirubin Urine: NEGATIVE
Hgb urine dipstick: NEGATIVE
Ketones, ur: NEGATIVE
Nitrite: NEGATIVE
RBC / HPF: NONE SEEN (ref 0–?)
Specific Gravity, Urine: <=1.005 — AB (ref 1.000–1.030)
Total Protein, Urine: NEGATIVE
URINE GLUCOSE: NEGATIVE
UROBILINOGEN UA: 0.2 (ref 0.0–1.0)
pH: 7 (ref 5.0–8.0)

## 2017-04-04 LAB — CBC WITH DIFFERENTIAL/PLATELET
BASOS ABS: 0.1 10*3/uL (ref 0.0–0.1)
Basophils Relative: 1 % (ref 0.0–3.0)
EOS ABS: 0.3 10*3/uL (ref 0.0–0.7)
Eosinophils Relative: 5.5 % — ABNORMAL HIGH (ref 0.0–5.0)
HCT: 39.5 % (ref 36.0–46.0)
Hemoglobin: 13.2 g/dL (ref 12.0–15.0)
Lymphocytes Relative: 26.4 % (ref 12.0–46.0)
Lymphs Abs: 1.5 10*3/uL (ref 0.7–4.0)
MCHC: 33.4 g/dL (ref 30.0–36.0)
MCV: 87.1 fl (ref 78.0–100.0)
MONOS PCT: 10.1 % (ref 3.0–12.0)
Monocytes Absolute: 0.6 10*3/uL (ref 0.1–1.0)
NEUTROS ABS: 3.2 10*3/uL (ref 1.4–7.7)
NEUTROS PCT: 57 % (ref 43.0–77.0)
PLATELETS: 258 10*3/uL (ref 150.0–400.0)
RBC: 4.53 Mil/uL (ref 3.87–5.11)
RDW: 12.3 % (ref 11.5–15.5)
WBC: 5.5 10*3/uL (ref 4.0–10.5)

## 2017-04-04 LAB — BASIC METABOLIC PANEL
BUN: 11 mg/dL (ref 6–23)
CHLORIDE: 100 meq/L (ref 96–112)
CO2: 27 mEq/L (ref 19–32)
Calcium: 9.6 mg/dL (ref 8.4–10.5)
Creatinine, Ser: 0.65 mg/dL (ref 0.40–1.20)
GFR: 95.1 mL/min (ref >60.00)
GLUCOSE: 87 mg/dL (ref 70–99)
POTASSIUM: 4 meq/L (ref 3.5–5.1)
SODIUM: 135 meq/L (ref 135–145)

## 2017-04-04 LAB — HEPATIC FUNCTION PANEL
ALBUMIN: 4.3 g/dL (ref 3.5–5.2)
ALK PHOS: 42 U/L (ref 39–117)
ALT: 14 U/L (ref 0–35)
AST: 18 U/L (ref 0–37)
Bilirubin, Direct: 0.1 mg/dL (ref 0.0–0.3)
Total Bilirubin: 0.4 mg/dL (ref 0.2–1.2)
Total Protein: 7 g/dL (ref 6.0–8.3)

## 2017-04-04 LAB — TSH: TSH: 2.62 u[IU]/mL (ref 0.35–4.50)

## 2017-04-04 NOTE — Assessment & Plan Note (Signed)
ecg reviewed, has lost wt with lack of energy for little to no apparent reason; for labs as ordered

## 2017-04-04 NOTE — Assessment & Plan Note (Signed)
Etiology unclear, for labs and other evaluation today, also SPEP with labs

## 2017-04-04 NOTE — Assessment & Plan Note (Addendum)
Etiology unclear, ecg reviewed, for CXR and stress testing as well  Note:  Total time for pt hx, exam, review of record with pt in the room, determination of diagnoses and plan for further eval and tx is > 40 min, with over 50% spent in coordination and counseling of patient with respect to the differential dx, eval and tx of her multiple symptomatology of fatigue/exhaustion, wt loss, dyspnea and CP

## 2017-04-04 NOTE — Patient Instructions (Addendum)
Your EKG was OK today  Please continue all other medications as before, and refills have been done if requested.  Please have the pharmacy call with any other refills you may need.  Please keep your appointments with your specialists as you may have planned  You will be contacted regarding the referral for: Echocardiogram, and Stress test  Please go to the XRAY Department in the Basement (go straight as you get off the elevator) for the x-ray testing  Please go to the LAB in the Basement (turn left off the elevator) for the tests to be done today  You will be contacted by phone if any changes need to be made immediately.  Otherwise, you will receive a letter about your results with an explanation, but please check with MyChart first.  Please see Dr Posey ReaPlotnikov in 2 weeks

## 2017-04-04 NOTE — Progress Notes (Addendum)
Subjective:    Patient ID: Brittany Mathis, female    DOB: 1946-08-06, 71 y.o.   MRN: [id number]  HPI    Had June 9 appt with PCP doing well.  1 wk later had right eye cataract and lens placement done.  Did not like the anesthesia due to lack of energy and exhausted, even to the point she has to slow down and take a deep breath and sit sometimes., sometimes assoc with CP, dull, intermittent, 1 mo, no radiation, not assoc with nausea but had wave of weakness and dizzines with it sometimes.  May have a slight cough with the pain.  Lost 8 lbs though still eating well per pt.  Recent labs ok.  Has hx of hyperthyroid years ago.   Pt denies fever, night sweats, loss of appetite, or other constitutional symptoms  Denies urinary symptoms such as dysuria, frequency, urgency, flank pain, hematuria or n/v, fever, chills.  Denies worsening reflux, abd pain, dysphagia, n/v, or blood.  Has had some mild constipatoin lately. Has not tried antacids  Last colonoscopy 9 yrs ago.  Wt Readings from Last 3 Encounters:  04/04/17 102 lb (46.3 kg)  02/21/17 109 lb 0.6 oz (49.5 kg)  08/21/16 106 lb (48.1 kg)  Never smoked, and no signifcaint second hand smoke.  Has some mild spring nasal allergy symptoms but minor and not treated, and no wheezing. Pt denies orthopnea, PND, increased LE swelling, or syncope.  incidnetly with mamogram tomorrow.  No hx of malignancy.  Denies worsening depressive symptoms, suicidal ideation, or panic Past Medical History:  Diagnosis Date  . History of shingles   . Hyperlipidemia   . Hypertension   . Nephrolithiasis 1972  . Osteopenia 03/2014   T score -1.9 FRAX 10%/1.9% stable from prior DEXA 2013  . Thyroid disease 2010   thyroiditis   Past Surgical History:  Procedure Laterality Date  . ABDOMINAL HYSTERECTOMY  1983   with BSO  menorrhagia, endometriosis  . OOPHORECTOMY     BSO    reports that she has never smoked. She has never used smokeless tobacco. She reports that she drinks  about 8.4 oz of alcohol per week . She reports that she does not use drugs. family history includes Colon cancer in her maternal aunt; Glaucoma in her father; Hyperlipidemia in her father and mother; Hypertension in her father and mother; Kidney disease in her father. Allergies  Allergen Reactions  . Influenza Vaccines Other (See Comments)    Patient has had a reaction to flu vaccine including, nausea, vomiting and body aches that are severe enough for patient to decline taking.     Current Outpatient Prescriptions on File Prior to Visit  Medication Sig Dispense Refill  . cholecalciferol (VITAMIN D) 1000 UNITS tablet Take 1,000 Units by mouth daily.      [name]. estradiol (VIVELLE-DOT) 0.0375 MG/24HR Place 1 patch onto the skin 2 (two) times a week. 24 patch 4  . Multiple Vitamin (MULTIVITAMIN) tablet Take 2 tablets by mouth daily.     . simvastatin (ZOCOR) 20 MG tablet Take 1 tablet by mouth  every night at bedtime 90 tablet 3  . valsartan (DIOVAN) 160 MG tablet Take 1 tablet (160 mg total) by mouth daily. 90 tablet 3   No current facility-administered medications on file prior to visit.    Review of Systems  Constitutional: Negative for other unusual diaphoresis or sweats HENT: Negative for ear discharge or swelling Eyes: Negative for other worsening visual disturbances Respiratory:  Negative for stridor or other swelling  Gastrointestinal: Negative for worsening distension or other blood Genitourinary: Negative for retention or other urinary change Musculoskeletal: Negative for other MSK pain or swelling Skin: Negative for color change or other new lesions Neurological: Negative for worsening tremors and other numbness  Psychiatric/Behavioral: Negative for worsening agitation or other fatigue All other system neg per pt    Objective:   Physical Exam BP 122/84   Pulse 81   Ht 5\' 5"  (1.651 m)   Wt 102 lb (46.3 kg)   SpO2 100%   BMI 16.97 kg/m  VS noted, thin for ht, has lost wt  recently, fatigued Constitutional: Pt appears in NAD HENT: Head: NCAT.  Right Ear: External ear normal.  Left Ear: External ear normal.  Eyes: . Pupils are equal, round, and reactive to light. Conjunctivae and EOM are normal Nose: without d/c or deformity Neck: Neck supple. Gross normal ROM Cardiovascular: Normal rate and regular rhythm.   Pulmonary/Chest: Effort normal and breath sounds somewhat decreased without rales or wheezing.  Abd:  Soft, NT, ND, + BS, no organomegaly Neurological: Pt is alert. At baseline orientation, motor grossly intact Skin: Skin is warm. No rashes, other new lesions, no LE edema Psychiatric: Pt behavior is normal without agitation , not depressed affect No other exam findings  Lab Results  Component Value Date   WBC 5.5 02/21/2017   HGB 13.5 02/21/2017   HCT 39.8 02/21/2017   PLT 236.0 02/21/2017   GLUCOSE 91 02/21/2017   CHOL 231 (H) 02/21/2017   TRIG 63.0 02/21/2017   HDL 112.30 02/21/2017   LDLDIRECT 109.2 02/05/2012   LDLCALC 106 (H) 02/21/2017   ALT 16 02/21/2017   AST 22 02/21/2017   NA 132 (L) 02/21/2017   K 5.1 02/21/2017   CL 98 02/21/2017   CREATININE 0.63 02/21/2017   BUN 13 02/21/2017   CO2 25 02/21/2017   TSH 1.95 02/21/2017   ECG today I have personally interpreted SR - WNL    Assessment & Plan:

## 2017-04-04 NOTE — Assessment & Plan Note (Signed)
Mild assoc with the fatigue per pt only, for cxr, and echo as well

## 2017-04-05 ENCOUNTER — Telehealth: Payer: Self-pay | Admitting: Internal Medicine

## 2017-04-05 LAB — D-DIMER, QUANTITATIVE: D-Dimer, Quant: 0.41 mcg/mL FEU (ref <0.50)

## 2017-04-05 LAB — HM MAMMOGRAPHY

## 2017-04-05 NOTE — Telephone Encounter (Signed)
Called the patient to schedule echo and LVM.

## 2017-04-17 ENCOUNTER — Encounter: Payer: Self-pay | Admitting: Internal Medicine

## 2017-04-17 ENCOUNTER — Ambulatory Visit (INDEPENDENT_AMBULATORY_CARE_PROVIDER_SITE_OTHER): Payer: Medicare Other | Admitting: Internal Medicine

## 2017-04-17 DIAGNOSIS — R5383 Other fatigue: Secondary | ICD-10-CM | POA: Diagnosis not present

## 2017-04-17 NOTE — Patient Instructions (Signed)
Hold Simvastatin for 2 weeks Call if not better Find out was anesthetic was used Re-start your multivitamin

## 2017-04-17 NOTE — Assessment & Plan Note (Addendum)
x 1 mo post-cataract anesthesia  better now by 30-50%.  Hold Simvastatin ECHO pending Labs ok Call if not better Find out was anesthetic was used

## 2017-04-17 NOTE — Progress Notes (Signed)
Subjective:  Patient ID: Brittany Mathis, female    DOB: Oct 18, 1945  Age: 71 y.o. MRN: [id number]  CC: No chief complaint on file.   HPI CLOIS MONTAVON presents for weakness x 1 mo following a cataract surgery. She was weak, dizzy, SOB, lightheadedness. Much better now by 30-50%. Lost wt; appetite has remained normal  Outpatient Medications Prior to Visit  Medication Sig Dispense Refill  . cholecalciferol (VITAMIN D) 1000 UNITS tablet Take 1,000 Units by mouth daily.      [name] estradiol (VIVELLE-DOT) 0.0375 MG/24HR Place 1 patch onto the skin 2 (two) times a week. 24 patch 4  . Multiple Vitamin (MULTIVITAMIN) tablet Take 2 tablets by mouth daily.     . simvastatin (ZOCOR) 20 MG tablet Take 1 tablet by mouth  every night at bedtime 90 tablet 3  . valsartan (DIOVAN) 160 MG tablet Take 1 tablet (160 mg total) by mouth daily. 90 tablet 3   No facility-administered medications prior to visit.     ROS Review of Systems  Constitutional: Positive for fatigue. Negative for activity change, appetite change, chills and unexpected weight change.  HENT: Negative for congestion, mouth sores and sinus pressure.   Eyes: Negative for visual disturbance.  Respiratory: Negative for cough and chest tightness.   Gastrointestinal: Negative for abdominal pain and nausea.  Genitourinary: Negative for difficulty urinating, frequency and vaginal pain.  Musculoskeletal: Negative for back pain and gait problem.  Skin: Negative for pallor and rash.  Neurological: Negative for dizziness, tremors, weakness, numbness and headaches.  Psychiatric/Behavioral: Negative for confusion and sleep disturbance.    Objective:  BP 120/78 (BP Location: Left Arm, Patient Position: Sitting, Cuff Size: Normal)   Pulse 70   Temp 97.8 F (36.6 C) (Oral)   Ht 5\' 5"  (1.651 m)   Wt 103 lb (46.7 kg)   SpO2 100%   BMI 17.14 kg/m   BP Readings from Last 3 Encounters:  04/17/17 120/78  04/04/17 122/84  02/21/17 (!) 144/86    Wt  Readings from Last 3 Encounters:  04/17/17 103 lb (46.7 kg)  04/04/17 102 lb (46.3 kg)  02/21/17 109 lb 0.6 oz (49.5 kg)    Physical Exam  Constitutional: She appears well-developed. No distress.  HENT:  Head: Normocephalic.  Right Ear: External ear normal.  Left Ear: External ear normal.  Nose: Nose normal.  Mouth/Throat: Oropharynx is clear and moist.  Eyes: Conjunctivae are normal. Pupils are equal, round, and reactive to light. Right eye exhibits no discharge. Left eye exhibits no discharge.  Neck: Normal range of motion. Neck supple. No JVD present. No tracheal deviation present. No thyromegaly present.  Cardiovascular: Normal rate, regular rhythm and normal heart sounds.   Pulmonary/Chest: No stridor. No respiratory distress. She has no wheezes.  Abdominal: Soft. Bowel sounds are normal. She exhibits no distension and no mass. There is no tenderness. There is no rebound and no guarding.  Musculoskeletal: She exhibits no edema or tenderness.  Lymphadenopathy:    She has no cervical adenopathy.  Neurological: She displays normal reflexes. No cranial nerve deficit. She exhibits normal muscle tone. Coordination normal.  Skin: No rash noted. No erythema.  Psychiatric: She has a normal mood and affect. Her behavior is normal. Judgment and thought content normal.    Lab Results  Component Value Date   WBC 5.5 04/04/2017   HGB 13.2 04/04/2017   HCT 39.5 04/04/2017   PLT 258.0 04/04/2017   GLUCOSE 87 04/04/2017   CHOL 231 (H)  02/21/2017   TRIG 63.0 02/21/2017   HDL 112.30 02/21/2017   LDLDIRECT 109.2 02/05/2012   LDLCALC 106 (H) 02/21/2017   ALT 14 04/04/2017   AST 18 04/04/2017   NA 135 04/04/2017   K 4.0 04/04/2017   CL 100 04/04/2017   CREATININE 0.65 04/04/2017   BUN 11 04/04/2017   CO2 27 04/04/2017   TSH 2.62 04/04/2017    Dg Chest 2 View  Result Date: 04/04/2017 CLINICAL DATA:  Fatigue and shortness of breath.  Dry cough. EXAM: CHEST  2 VIEW COMPARISON:  CT of  the abdomen and pelvis 10/18/2014 FINDINGS: The heart size and mediastinal contours are within normal limits. Both lungs are clear. The visualized skeletal structures are unremarkable. IMPRESSION: Negative two view chest x-ray Electronically Signed   By: Marin Robertshristopher  Mattern M.D.   On: 04/04/2017 13:20    Assessment & Plan:   There are no diagnoses linked to this encounter. I am having Ms. Chaikin maintain her multivitamin, cholecalciferol, estradiol, simvastatin, and valsartan.  No orders of the defined types were placed in this encounter.    Follow-up: No Follow-up on file.  Sonda PrimesAlex Addelynn Batte, MD

## 2017-04-19 ENCOUNTER — Other Ambulatory Visit: Payer: Self-pay

## 2017-04-19 ENCOUNTER — Ambulatory Visit (HOSPITAL_COMMUNITY): Payer: Medicare Other | Attending: Cardiology

## 2017-04-19 DIAGNOSIS — R5383 Other fatigue: Secondary | ICD-10-CM | POA: Insufficient documentation

## 2017-04-19 DIAGNOSIS — R634 Abnormal weight loss: Secondary | ICD-10-CM | POA: Insufficient documentation

## 2017-04-19 DIAGNOSIS — R079 Chest pain, unspecified: Secondary | ICD-10-CM | POA: Diagnosis not present

## 2017-04-19 DIAGNOSIS — R0600 Dyspnea, unspecified: Secondary | ICD-10-CM | POA: Insufficient documentation

## 2017-04-20 ENCOUNTER — Encounter: Payer: Self-pay | Admitting: Internal Medicine

## 2017-04-20 NOTE — Progress Notes (Signed)
Result Abstracted 

## 2017-04-25 ENCOUNTER — Ambulatory Visit (INDEPENDENT_AMBULATORY_CARE_PROVIDER_SITE_OTHER): Payer: Medicare Other | Admitting: Gynecology

## 2017-04-25 ENCOUNTER — Encounter: Payer: Self-pay | Admitting: Gynecology

## 2017-04-25 VITALS — BP 116/74 | Ht 64.5 in | Wt 101.0 lb

## 2017-04-25 DIAGNOSIS — Z79890 Hormone replacement therapy (postmenopausal): Secondary | ICD-10-CM

## 2017-04-25 DIAGNOSIS — M8580 Other specified disorders of bone density and structure, unspecified site: Secondary | ICD-10-CM | POA: Diagnosis not present

## 2017-04-25 DIAGNOSIS — N952 Postmenopausal atrophic vaginitis: Secondary | ICD-10-CM | POA: Diagnosis not present

## 2017-04-25 DIAGNOSIS — Z01411 Encounter for gynecological examination (general) (routine) with abnormal findings: Secondary | ICD-10-CM

## 2017-04-25 MED ORDER — ESTRADIOL 0.0375 MG/24HR TD PTTW: 1.0000 | MEDICATED_PATCH | TRANSDERMAL | 4 refills | Status: DC

## 2017-04-25 NOTE — Patient Instructions (Signed)
Follow up for your bone density as scheduled. 

## 2017-04-25 NOTE — Progress Notes (Signed)
    Brittany Mathis 1946-02-03 [id number]        71 y.o.  [name] for annual exam.    Past medical history,surgical history, problem list, medications, allergies, family history and social history were all reviewed and documented as reviewed in the EPIC chart.  ROS:  Performed with pertinent positives and negatives included in the history, assessment and plan.   Additional significant findings :  None   Exam: Kennon PortelaKim Gardner assistant Vitals:   04/25/17 0949  BP: 116/74  Weight: 101 lb (45.8 kg)  Height: 5' 4.5" (1.638 m)   Body mass index is 17.07 kg/m.  General appearance:  Normal affect, orientation and appearance. Skin: Grossly normal HEENT: Without gross lesions.  No cervical or supraclavicular adenopathy. Thyroid normal.  Lungs:  Clear without wheezing, rales or rhonchi Cardiac: RR, without RMG Abdominal:  Soft, nontender, without masses, guarding, rebound, organomegaly or hernia Breasts:  Examined lying and sitting without masses, retractions, discharge or axillary adenopathy. Pelvic:  Ext, BUS, Vagina: With atrophic changes  Adnexa: Without masses or tenderness    Anus and perineum: Normal   Rectovaginal: Normal sphincter tone without palpated masses or tenderness.    Assessment/Plan:  71 y.o. [name] female for annual exam.   1. Postmenopausal/atrophic genital changes/ERT. Continues on estradiol 0.0375 patch. Has tried weaning but had unacceptable side effects. I reviewed the most current data on HRT to include the 2017 NAMS guidelines. Benefits as far as symptom relief, cardiovascular/bone health and started early reviewed. Risks to include thrombosis such as stroke heart attack DVT and breast cancer issues all discussed. After a lengthy discussion the patient wants to continue and I refilled her 1 year. 2. Osteopenia. DEXA 2015 T score -1.9 FRAX 10%/1.9%. Stable from prior DEXA 2013. Schedule DEXA now at 3 year interval and patient will follow up for this. 3. Mammography  03/2017. Continue with annual mammography when due. SBE monthly reviewed. Breast exam normal today. 4. Pap smear 2012. No Pap smear done today. We both agree to stop screening based on current screening guidelines with age and hysterectomy. No history of abnormal Pap smears previously. 5. Colonoscopy coming due next year and she is going to arrange for this. 6. Health maintenance. No routine lab work done as patient does this elsewhere. Follow up 1 year, sooner as needed   Dara LordsFONTAINE,Sullivan Jacuinde P MD, 10:16 AM 04/25/2017

## 2017-05-16 DIAGNOSIS — M810 Age-related osteoporosis without current pathological fracture: Secondary | ICD-10-CM

## 2017-05-16 HISTORY — DX: Age-related osteoporosis without current pathological fracture: M81.0

## 2017-05-31 ENCOUNTER — Other Ambulatory Visit: Payer: Self-pay | Admitting: Gynecology

## 2017-05-31 ENCOUNTER — Ambulatory Visit (INDEPENDENT_AMBULATORY_CARE_PROVIDER_SITE_OTHER): Payer: Medicare Other

## 2017-05-31 DIAGNOSIS — M8580 Other specified disorders of bone density and structure, unspecified site: Secondary | ICD-10-CM

## 2017-05-31 DIAGNOSIS — M810 Age-related osteoporosis without current pathological fracture: Secondary | ICD-10-CM

## 2017-05-31 DIAGNOSIS — Z780 Asymptomatic menopausal state: Secondary | ICD-10-CM

## 2017-06-01 ENCOUNTER — Encounter: Payer: Self-pay | Admitting: Gynecology

## 2017-06-01 ENCOUNTER — Telehealth: Payer: Self-pay | Admitting: Gynecology

## 2017-06-01 NOTE — Telephone Encounter (Signed)
Tell patient that her most recent bone density shows osteoporosis.  Recommend office visit to discuss treatment options.

## 2017-06-01 NOTE — Telephone Encounter (Signed)
Pt informed, transferred to front desk 

## 2017-06-04 ENCOUNTER — Encounter: Payer: Self-pay | Admitting: Gynecology

## 2017-06-04 ENCOUNTER — Ambulatory Visit (INDEPENDENT_AMBULATORY_CARE_PROVIDER_SITE_OTHER): Payer: Medicare Other | Admitting: Gynecology

## 2017-06-04 VITALS — BP 120/70

## 2017-06-04 DIAGNOSIS — M810 Age-related osteoporosis without current pathological fracture: Secondary | ICD-10-CM | POA: Diagnosis not present

## 2017-06-04 MED ORDER — ALENDRONATE SODIUM 70 MG PO TABS: 70.0000 mg | ORAL_TABLET | ORAL | 11 refills | Status: DC

## 2017-06-04 NOTE — Patient Instructions (Signed)
Alendronate tablets  What is this medicine?  ALENDRONATE (a LEN droe nate) slows calcium loss from bones. It helps to make normal healthy bone and to slow bone loss in people with Paget's disease and osteoporosis. It may be used in others at risk for bone loss.  This medicine may be used for other purposes; ask your health care provider or pharmacist if you have questions.  COMMON BRAND NAME(S): Fosamax  What should I tell my health care provider before I take this medicine?  They need to know if you have any of these conditions:  -dental disease  -esophagus, stomach, or intestine problems, like acid reflux or GERD  -kidney disease  -low blood calcium  -low vitamin D  -problems sitting or standing 30 minutes  -trouble swallowing  -an unusual or allergic reaction to alendronate, other medicines, foods, dyes, or preservatives  -pregnant or trying to get pregnant  -breast-feeding  How should I use this medicine?  You must take this medicine exactly as directed or you will lower the amount of the medicine you absorb into your body or you may cause yourself harm. Take this medicine by mouth first thing in the morning, after you are up for the day. Do not eat or drink anything before you take your medicine. Swallow the tablet with a full glass (6 to 8 fluid ounces) of plain water. Do not take this medicine with any other drink. Do not chew or crush the tablet. After taking this medicine, do not eat breakfast, drink, or take any medicines or vitamins for at least 30 minutes. Sit or stand up for at least 30 minutes after you take this medicine; do not lie down. Do not take your medicine more often than directed.  Talk to your pediatrician regarding the use of this medicine in children. Special care may be needed.  Overdosage: If you think you have taken too much of this medicine contact a poison control center or emergency room at once.  NOTE: This medicine is only for you. Do not share this medicine with others.  What if I  miss a dose?  If you miss a dose, do not take it later in the day. Continue your normal schedule starting the next morning. Do not take double or extra doses.  What may interact with this medicine?  -aluminum hydroxide  -antacids  -aspirin  -calcium supplements  -drugs for inflammation like ibuprofen, naproxen, and others  -iron supplements  -magnesium supplements  -vitamins with minerals  This list may not describe all possible interactions. Give your health care provider a list of all the medicines, herbs, non-prescription drugs, or dietary supplements you use. Also tell them if you smoke, drink alcohol, or use illegal drugs. Some items may interact with your medicine.  What should I watch for while using this medicine?  Visit your doctor or health care professional for regular checks ups. It may be some time before you see benefit from this medicine. Do not stop taking your medicine except on your doctor's advice. Your doctor or health care professional may order blood tests and other tests to see how you are doing.  You should make sure you get enough calcium and vitamin D while you are taking this medicine, unless your doctor tells you not to. Discuss the foods you eat and the vitamins you take with your health care professional.  Some people who take this medicine have severe bone, joint, and/or muscle pain. This medicine may also increase your risk   for a broken thigh bone. Tell your doctor right away if you have pain in your upper leg or groin. Tell your doctor if you have any pain that does not go away or that gets worse.  This medicine can make you more sensitive to the sun. If you get a rash while taking this medicine, sunlight may cause the rash to get worse. Keep out of the sun. If you cannot avoid being in the sun, wear protective clothing and use sunscreen. Do not use sun lamps or tanning beds/booths.  What side effects may I notice from receiving this medicine?  Side effects that you should report to  your doctor or health care professional as soon as possible:  -allergic reactions like skin rash, itching or hives, swelling of the face, lips, or tongue  -black or tarry stools  -bone, muscle or joint pain  -changes in vision  -chest pain  -heartburn or stomach pain  -jaw pain, especially after dental work  -pain or trouble when swallowing  -redness, blistering, peeling or loosening of the skin, including inside the mouth  Side effects that usually do not require medical attention (report to your doctor or health care professional if they continue or are bothersome):  -changes in taste  -diarrhea or constipation  -eye pain or itching  -headache  -nausea or vomiting  -stomach gas or fullness  This list may not describe all possible side effects. Call your doctor for medical advice about side effects. You may report side effects to FDA at 1-800-FDA-1088.  Where should I keep my medicine?  Keep out of the reach of children.  Store at room temperature of 15 and 30 degrees C (59 and 86 degrees F). Throw away any unused medicine after the expiration date.  NOTE: This sheet is a summary. It may not cover all possible information. If you have questions about this medicine, talk to your doctor, pharmacist, or health care provider.   2018 Elsevier/Gold Standard (2011-03-31 08:56:09)

## 2017-06-04 NOTE — Progress Notes (Signed)
    Brittany Mathis 01-23-1946 [id number]        71 y.o.  [name] presents to discuss her most recent bone density showing osteoporosis T score -2.7 left femoral neck. T score -2.1 right femoral neck. Both show a statistically significant decline from her last study. Does have a family history of osteoporosis in her mother. No pathological fractures in the patient. Takes a calcium and vitamin D supplement.  Past medical history,surgical history, problem list, medications, allergies, family history and social history were all reviewed and documented in the EPIC chart.  Directed ROS with pertinent positives and negatives documented in the history of present illness/assessment and plan.  Exam: Vitals:   06/04/17 1150  BP: 120/70   General appearance:  Normal   Assessment/Plan:  71 y.o. [name] with osteoporosis T score -2.7.  Risk factors include patient is thin Caucasian with family history of osteoporosis. Treatment options reviewed to include bisphosphonates oral versus IV, Prolia, Prolia. She continues on estradiol 0.0375 patch. I discussed the side effects and risks of each medication with her to include osteonecrosis of the jaw, atypical fractures, GERD, rashes, infections all reviewed. Her increased risk of fracture based on her DEXA as well as her history also discussed. The patient wants to go ahead and start treatment and we both agree on alendronate 70 mg weekly. I reviewed how to take the medications and what side effects to look out for. She'll call me if she has any issues with this. Will plan on repeating her bone density in 2 years. I reviewed calcium and vitamin D supplement recommendations. I did recommend we check a vitamin D level today and she went ahead and had this drawn. I also discussed weightbearing on a regular basis.  Greater than 50% of my time was spent in direct face to face counseling and coordination of care with the patient.    Dara Lords MD, 12:10 PM  06/04/2017

## 2017-06-05 LAB — VITAMIN D 25 HYDROXY (VIT D DEFICIENCY, FRACTURES): VIT D 25 HYDROXY: 35 ng/mL (ref 30–100)

## 2017-08-29 ENCOUNTER — Ambulatory Visit: Payer: Medicare Other | Admitting: Internal Medicine

## 2017-08-29 ENCOUNTER — Encounter: Payer: Self-pay | Admitting: Internal Medicine

## 2017-08-29 DIAGNOSIS — M810 Age-related osteoporosis without current pathological fracture: Secondary | ICD-10-CM | POA: Diagnosis not present

## 2017-08-29 DIAGNOSIS — I10 Essential (primary) hypertension: Secondary | ICD-10-CM | POA: Diagnosis not present

## 2017-08-29 DIAGNOSIS — E785 Hyperlipidemia, unspecified: Secondary | ICD-10-CM | POA: Diagnosis not present

## 2017-08-29 NOTE — Patient Instructions (Signed)
Wt Readings from Last 3 Encounters:  08/29/17 103 lb (46.7 kg)  04/25/17 101 lb (45.8 kg)  04/17/17 103 lb (46.7 kg)

## 2017-08-29 NOTE — Progress Notes (Signed)
Subjective:  Patient ID: Brittany Mathis, female    DOB: 07-23-1946  Age: 71 y.o. MRN: [id number]  CC: Follow-up (Patient doing well. 6 Month follow up.)   HPI Brittany Mathis presents for HTN, osteoporosis, dyslipidemia f/u  Outpatient Medications Prior to Visit  Medication Sig Dispense Refill  . alendronate (FOSAMAX) 70 MG tablet Take 1 tablet (70 mg total) by mouth every 7 (seven) days. Take with a full glass of water on an empty stomach. 4 tablet 11  . cholecalciferol (VITAMIN D) 1000 UNITS tablet Take 1,000 Units by mouth daily.      [name]. estradiol (VIVELLE-DOT) 0.0375 MG/24HR Place 1 patch onto the skin 2 (two) times a week. 24 patch 4  . simvastatin (ZOCOR) 20 MG tablet Take 1 tablet by mouth  every night at bedtime 90 tablet 3  . valsartan (DIOVAN) 160 MG tablet Take 1 tablet (160 mg total) by mouth daily. 90 tablet 3   No facility-administered medications prior to visit.     ROS Review of Systems  Constitutional: Negative for activity change, appetite change, chills, fatigue and unexpected weight change.  HENT: Negative for congestion, mouth sores and sinus pressure.   Eyes: Negative for visual disturbance.  Respiratory: Negative for cough and chest tightness.   Gastrointestinal: Negative for abdominal pain and nausea.  Genitourinary: Negative for difficulty urinating, frequency and vaginal pain.  Musculoskeletal: Negative for back pain and gait problem.  Skin: Negative for pallor and rash.  Neurological: Negative for dizziness, tremors, weakness, numbness and headaches.  Psychiatric/Behavioral: Negative for confusion and sleep disturbance.    Objective:  BP 110/68 (BP Location: Left Arm, Patient Position: Sitting, Cuff Size: Normal)   Pulse 72   Temp 98.2 F (36.8 C) (Oral)   Ht 5' 4.5" (1.638 m)   Wt 103 lb (46.7 kg)   SpO2 100%   BMI 17.41 kg/m   BP Readings from Last 3 Encounters:  08/29/17 110/68  06/04/17 120/70  04/25/17 116/74    Wt Readings from Last 3  Encounters:  08/29/17 103 lb (46.7 kg)  04/25/17 101 lb (45.8 kg)  04/17/17 103 lb (46.7 kg)    Physical Exam  Constitutional: She appears well-developed. No distress.  HENT:  Head: Normocephalic.  Right Ear: External ear normal.  Left Ear: External ear normal.  Nose: Nose normal.  Mouth/Throat: Oropharynx is clear and moist.  Eyes: Conjunctivae are normal. Pupils are equal, round, and reactive to light. Right eye exhibits no discharge. Left eye exhibits no discharge.  Neck: Normal range of motion. Neck supple. No JVD present. No tracheal deviation present. No thyromegaly present.  Cardiovascular: Normal rate, regular rhythm and normal heart sounds.  Pulmonary/Chest: No stridor. No respiratory distress. She has no wheezes.  Abdominal: Soft. Bowel sounds are normal. She exhibits no distension and no mass. There is no tenderness. There is no rebound and no guarding.  Musculoskeletal: She exhibits no edema or tenderness.  Lymphadenopathy:    She has no cervical adenopathy.  Neurological: She displays normal reflexes. No cranial nerve deficit. She exhibits normal muscle tone. Coordination normal.  Skin: No rash noted. No erythema.  Psychiatric: She has a normal mood and affect. Her behavior is normal. Judgment and thought content normal.    Lab Results  Component Value Date   WBC 5.5 04/04/2017   HGB 13.2 04/04/2017   HCT 39.5 04/04/2017   PLT 258.0 04/04/2017   GLUCOSE 87 04/04/2017   CHOL 231 (H) 02/21/2017   TRIG 63.0 02/21/2017  HDL 112.30 02/21/2017   LDLDIRECT 109.2 02/05/2012   LDLCALC 106 (H) 02/21/2017   ALT 14 04/04/2017   AST 18 04/04/2017   NA 135 04/04/2017   K 4.0 04/04/2017   CL 100 04/04/2017   CREATININE 0.65 04/04/2017   BUN 11 04/04/2017   CO2 27 04/04/2017   TSH 2.62 04/04/2017    Dg Chest 2 View  Result Date: 04/04/2017 CLINICAL DATA:  Fatigue and shortness of breath.  Dry cough. EXAM: CHEST  2 VIEW COMPARISON:  CT of the abdomen and pelvis  10/18/2014 FINDINGS: The heart size and mediastinal contours are within normal limits. Both lungs are clear. The visualized skeletal structures are unremarkable. IMPRESSION: Negative two view chest x-ray Electronically Signed   By: Marin Robertshristopher  Mattern M.D.   On: 04/04/2017 13:20    Assessment & Plan:   There are no diagnoses linked to this encounter. I am having Brittany Mathis maintain her cholecalciferol, simvastatin, valsartan, estradiol, and alendronate.  No orders of the defined types were placed in this encounter.    Follow-up: No Follow-up on file.  Sonda PrimesAlex Plotnikov, MD

## 2017-08-29 NOTE — Assessment & Plan Note (Signed)
Valsartan 

## 2017-08-29 NOTE — Assessment & Plan Note (Signed)
Simvastatin 

## 2017-08-29 NOTE — Assessment & Plan Note (Signed)
Dr Audie BoxFontaine On Fosamax 2018

## 2018-02-04 ENCOUNTER — Telehealth: Payer: Self-pay | Admitting: *Deleted

## 2018-02-04 NOTE — Telephone Encounter (Signed)
Prior authorization for vivelle dot patch 0.0375 mg patch twice weekly done via covermy meds website, will wait for response.

## 2018-02-04 NOTE — Telephone Encounter (Signed)
Request Reference Number: [id number]. ESTRADIOL DIS 0.0375MG  is approved through 10/15/2018. For further questions, call [phone].

## 2018-02-26 ENCOUNTER — Ambulatory Visit (INDEPENDENT_AMBULATORY_CARE_PROVIDER_SITE_OTHER): Payer: Medicare Other | Admitting: Internal Medicine

## 2018-02-26 ENCOUNTER — Encounter: Payer: Self-pay | Admitting: Internal Medicine

## 2018-02-26 ENCOUNTER — Other Ambulatory Visit (INDEPENDENT_AMBULATORY_CARE_PROVIDER_SITE_OTHER): Payer: Medicare Other

## 2018-02-26 VITALS — BP 118/76 | HR 60 | Temp 98.2°F | Ht 64.5 in | Wt 104.0 lb

## 2018-02-26 DIAGNOSIS — E785 Hyperlipidemia, unspecified: Secondary | ICD-10-CM

## 2018-02-26 DIAGNOSIS — Z0000 Encounter for general adult medical examination without abnormal findings: Secondary | ICD-10-CM

## 2018-02-26 DIAGNOSIS — R634 Abnormal weight loss: Secondary | ICD-10-CM

## 2018-02-26 DIAGNOSIS — I10 Essential (primary) hypertension: Secondary | ICD-10-CM | POA: Diagnosis not present

## 2018-02-26 LAB — LIPID PANEL
Cholesterol: 236 mg/dL — ABNORMAL HIGH (ref 0–200)
HDL: 107.4 mg/dL (ref >39.00)
LDL Cholesterol: 118 mg/dL — ABNORMAL HIGH (ref 0–99)
NONHDL: 128.78
Total CHOL/HDL Ratio: 2
Triglycerides: 55 mg/dL (ref 0.0–149.0)
VLDL: 11 mg/dL (ref 0.0–40.0)

## 2018-02-26 LAB — CBC WITH DIFFERENTIAL/PLATELET
Basophils Absolute: 0 10*3/uL (ref 0.0–0.1)
Basophils Relative: 0.9 % (ref 0.0–3.0)
EOS PCT: 3.6 % (ref 0.0–5.0)
Eosinophils Absolute: 0.2 10*3/uL (ref 0.0–0.7)
HEMATOCRIT: 40.4 % (ref 36.0–46.0)
Hemoglobin: 13.7 g/dL (ref 12.0–15.0)
LYMPHS ABS: 1.9 10*3/uL (ref 0.7–4.0)
Lymphocytes Relative: 34.3 % (ref 12.0–46.0)
MCHC: 33.9 g/dL (ref 30.0–36.0)
MCV: 88.4 fl (ref 78.0–100.0)
MONOS PCT: 11.4 % (ref 3.0–12.0)
Monocytes Absolute: 0.6 10*3/uL (ref 0.1–1.0)
NEUTROS PCT: 49.8 % (ref 43.0–77.0)
Neutro Abs: 2.8 10*3/uL (ref 1.4–7.7)
Platelets: 261 10*3/uL (ref 150.0–400.0)
RBC: 4.57 Mil/uL (ref 3.87–5.11)
RDW: 12.9 % (ref 11.5–15.5)
WBC: 5.5 10*3/uL (ref 4.0–10.5)

## 2018-02-26 LAB — URINALYSIS
BILIRUBIN URINE: NEGATIVE
Hgb urine dipstick: NEGATIVE
Ketones, ur: NEGATIVE
Leukocytes, UA: NEGATIVE
NITRITE: NEGATIVE
PH: 7 (ref 5.0–8.0)
SPECIFIC GRAVITY, URINE: 1.01 (ref 1.000–1.030)
TOTAL PROTEIN, URINE-UPE24: NEGATIVE
Urine Glucose: NEGATIVE
Urobilinogen, UA: 0.2 (ref 0.0–1.0)

## 2018-02-26 LAB — BASIC METABOLIC PANEL
BUN: 17 mg/dL (ref 6–23)
CALCIUM: 9.4 mg/dL (ref 8.4–10.5)
CHLORIDE: 98 meq/L (ref 96–112)
CO2: 29 meq/L (ref 19–32)
Creatinine, Ser: 0.61 mg/dL (ref 0.40–1.20)
GFR: 102.07 mL/min (ref >60.00)
Glucose, Bld: 101 mg/dL — ABNORMAL HIGH (ref 70–99)
POTASSIUM: 4.8 meq/L (ref 3.5–5.1)
SODIUM: 135 meq/L (ref 135–145)

## 2018-02-26 LAB — HEPATIC FUNCTION PANEL
ALK PHOS: 39 U/L (ref 39–117)
ALT: 15 U/L (ref 0–35)
AST: 20 U/L (ref 0–37)
Albumin: 4.4 g/dL (ref 3.5–5.2)
BILIRUBIN TOTAL: 0.6 mg/dL (ref 0.2–1.2)
Bilirubin, Direct: 0.1 mg/dL (ref 0.0–0.3)
Total Protein: 7.5 g/dL (ref 6.0–8.3)

## 2018-02-26 LAB — TSH: TSH: 1.85 u[IU]/mL (ref 0.35–4.50)

## 2018-02-26 MED ORDER — SIMVASTATIN 20 MG PO TABS: ORAL_TABLET | ORAL | 3 refills | Status: DC

## 2018-02-26 MED ORDER — VALSARTAN 160 MG PO TABS
160.0000 mg | ORAL_TABLET | Freq: Every day | ORAL | 3 refills | Status: DC
Start: 2018-02-26 — End: 2018-12-02

## 2018-02-26 NOTE — Assessment & Plan Note (Addendum)
resolved 

## 2018-02-26 NOTE — Assessment & Plan Note (Signed)
Simvastatin 

## 2018-02-26 NOTE — Progress Notes (Signed)
Subjective:  Patient ID: Brittany Mathis, female    DOB: [birth year]  Age: 72 y.o. MRN: [id number]  CC: No chief complaint on file.   HPI Brittany Mathis presents for a well exam  Outpatient Medications Prior to Visit  Medication Sig Dispense Refill  . alendronate (FOSAMAX) 70 MG tablet Take 1 tablet (70 mg total) by mouth every 7 (seven) days. Take with a full glass of water on an empty stomach. 4 tablet 11  . cholecalciferol (VITAMIN D) 1000 UNITS tablet Take 1,000 Units by mouth daily.      [name] estradiol (VIVELLE-DOT) 0.0375 MG/24HR Place 1 patch onto the skin 2 (two) times a week. 24 patch 4  . simvastatin (ZOCOR) 20 MG tablet Take 1 tablet by mouth  every night at bedtime 90 tablet 3  . valsartan (DIOVAN) 160 MG tablet Take 1 tablet (160 mg total) by mouth daily. 90 tablet 3   No facility-administered medications prior to visit.     ROS Review of Systems  Constitutional: Negative for activity change, appetite change, chills, fatigue and unexpected weight change.  HENT: Negative for congestion, mouth sores and sinus pressure.   Eyes: Negative for visual disturbance.  Respiratory: Negative for cough and chest tightness.   Gastrointestinal: Negative for abdominal pain and nausea.  Genitourinary: Negative for difficulty urinating, frequency and vaginal pain.  Musculoskeletal: Negative for back pain and gait problem.  Skin: Negative for pallor and rash.  Neurological: Negative for dizziness, tremors, weakness, numbness and headaches.  Psychiatric/Behavioral: Negative for confusion, sleep disturbance and suicidal ideas. The patient is nervous/anxious.     Objective:  BP 118/76 (BP Location: Left Arm, Patient Position: Sitting, Cuff Size: Normal)   Pulse 60   Temp 98.2 F (36.8 C) (Oral)   Ht 5' 4.5" (1.638 m)   Wt 104 lb (47.2 kg)   SpO2 98%   BMI 17.58 kg/m   BP Readings from Last 3 Encounters:  02/26/18 118/76  08/29/17 110/68  06/04/17 120/70    Wt Readings from Last 3  Encounters:  02/26/18 104 lb (47.2 kg)  08/29/17 103 lb (46.7 kg)  04/25/17 101 lb (45.8 kg)    Physical Exam  Constitutional: She appears well-developed. No distress.  HENT:  Head: Normocephalic.  Right Ear: External ear normal.  Left Ear: External ear normal.  Nose: Nose normal.  Mouth/Throat: Oropharynx is clear and moist.  Eyes: Pupils are equal, round, and reactive to light. Conjunctivae are normal. Right eye exhibits no discharge. Left eye exhibits no discharge.  Neck: Normal range of motion. Neck supple. No JVD present. No tracheal deviation present. No thyromegaly present.  Cardiovascular: Normal rate, regular rhythm and normal heart sounds.  Pulmonary/Chest: No stridor. No respiratory distress. She has no wheezes.  Abdominal: Soft. Bowel sounds are normal. She exhibits no distension and no mass. There is no tenderness. There is no rebound and no guarding.  Musculoskeletal: She exhibits no edema or tenderness.  Lymphadenopathy:    She has no cervical adenopathy.  Neurological: She displays normal reflexes. No cranial nerve deficit. She exhibits normal muscle tone. Coordination normal.  Skin: No rash noted. No erythema.  Psychiatric: She has a normal mood and affect. Her behavior is normal. Judgment and thought content normal.    Lab Results  Component Value Date   WBC 5.5 04/04/2017   HGB 13.2 04/04/2017   HCT 39.5 04/04/2017   PLT 258.0 04/04/2017   GLUCOSE 87 04/04/2017   CHOL 231 (H) 02/21/2017  TRIG 63.0 02/21/2017   HDL 112.30 02/21/2017   LDLDIRECT 109.2 02/05/2012   LDLCALC 106 (H) 02/21/2017   ALT 14 04/04/2017   AST 18 04/04/2017   NA 135 04/04/2017   K 4.0 04/04/2017   CL 100 04/04/2017   CREATININE 0.65 04/04/2017   BUN 11 04/04/2017   CO2 27 04/04/2017   TSH 2.62 04/04/2017    Dg Chest 2 View  Result Date: 04/04/2017 CLINICAL DATA:  Fatigue and shortness of breath.  Dry cough. EXAM: CHEST  2 VIEW COMPARISON:  CT of the abdomen and pelvis  10/18/2014 FINDINGS: The heart size and mediastinal contours are within normal limits. Both lungs are clear. The visualized skeletal structures are unremarkable. IMPRESSION: Negative two view chest x-ray Electronically Signed   By: Marin Roberts M.D.   On: 04/04/2017 13:20    Assessment & Plan:   There are no diagnoses linked to this encounter. I am having Will Bonnet. Diop maintain her cholecalciferol, simvastatin, valsartan, estradiol, and alendronate.  No orders of the defined types were placed in this encounter.    Follow-up: No follow-ups on file.  Sonda Primes, MD

## 2018-02-26 NOTE — Assessment & Plan Note (Signed)
Valsartan 

## 2018-02-26 NOTE — Assessment & Plan Note (Addendum)
Here for medicare wellness/physical  Diet: heart healthy  Physical activity: not sedentary  Depression/mood screen: negative  Hearing: intact to whispered voice  Visual acuity: grossly normal w/glasses, performs annual eye exam  ADLs: capable  Fall risk: low Home safety: good  Cognitive evaluation: intact to orientation, naming, recall and repetition  EOL planning: adv directives, full code/ I agree  I have personally reviewed and have noted  1. The patient's medical, surgical and social history  2. Their use of alcohol, tobacco or illicit drugs  3. Their current medications and supplements  4. The patient's functional ability including ADL's, fall risks, home safety risks and hearing or visual impairment.  5. Diet and physical activities  6. Evidence for depression or mood disorders 7. The roster of all physicians providing medical care to patient - is listed in the Snapshot section of the chart and reviewed today.    Today patient counseled on age appropriate routine health concerns for screening and prevention, each reviewed and up to date or declined. Immunizations reviewed and up to date or declined. Labs ordered and reviewed. Risk factors for depression reviewed and negative. Hearing function and visual acuity are intact. ADLs screened and addressed as needed. Functional ability and level of safety reviewed and appropriate. Education, counseling and referrals performed based on assessed risks today. Patient provided with a copy of personalized plan for preventive services.   Dr Ileana Roup - yearly Eye exam Dr Randon Goldsmith DEXA w/Dr Fontaine Colon is due this year

## 2018-04-10 LAB — HM MAMMOGRAPHY

## 2018-04-25 ENCOUNTER — Encounter: Payer: Self-pay | Admitting: Internal Medicine

## 2018-05-01 ENCOUNTER — Ambulatory Visit: Payer: Medicare Other | Admitting: Gynecology

## 2018-05-01 ENCOUNTER — Encounter: Payer: Self-pay | Admitting: Gynecology

## 2018-05-01 VITALS — BP 118/76 | Ht 64.0 in | Wt 104.0 lb

## 2018-05-01 DIAGNOSIS — M818 Other osteoporosis without current pathological fracture: Secondary | ICD-10-CM

## 2018-05-01 DIAGNOSIS — M810 Age-related osteoporosis without current pathological fracture: Secondary | ICD-10-CM | POA: Diagnosis not present

## 2018-05-01 DIAGNOSIS — Z01419 Encounter for gynecological examination (general) (routine) without abnormal findings: Secondary | ICD-10-CM

## 2018-05-01 DIAGNOSIS — Z79890 Hormone replacement therapy (postmenopausal): Secondary | ICD-10-CM | POA: Diagnosis not present

## 2018-05-01 DIAGNOSIS — N952 Postmenopausal atrophic vaginitis: Secondary | ICD-10-CM | POA: Diagnosis not present

## 2018-05-01 MED ORDER — ESTRADIOL 0.0375 MG/24HR TD PTTW: 1.0000 | MEDICATED_PATCH | TRANSDERMAL | 4 refills | Status: DC

## 2018-05-01 MED ORDER — ALENDRONATE SODIUM 70 MG PO TABS: 70.0000 mg | ORAL_TABLET | ORAL | 11 refills | Status: DC

## 2018-05-01 NOTE — Progress Notes (Signed)
    Brittany Mathis 02/12/1946 [id number]        72 y.o.  [name] for annual gynecologic exam.  Was started on alendronate last year due to osteoporosis but admits to not really starting it.  Continues on estradiol patch.  Past medical history,surgical history, problem list, medications, allergies, family history and social history were all reviewed and documented as reviewed in the EPIC chart.  ROS:  Performed with pertinent positives and negatives included in the history, assessment and plan.   Additional significant findings : None   Exam: Kennon PortelaKim Gardner assistant Vitals:   05/01/18 1017  BP: 118/76  Weight: 104 lb (47.2 kg)  Height: 5\' 4"  (1.626 m)   Body mass index is 17.85 kg/m.  General appearance:  Normal affect, orientation and appearance. Skin: Grossly normal HEENT: Without gross lesions.  No cervical or supraclavicular adenopathy. Thyroid normal.  Lungs:  Clear without wheezing, rales or rhonchi Cardiac: RR, without RMG Abdominal:  Soft, nontender, without masses, guarding, rebound, organomegaly or hernia Breasts:  Examined lying and sitting without masses, retractions, discharge or axillary adenopathy. Pelvic:  Ext, BUS, Vagina: With atrophic changes  Adnexa: Without masses or tenderness    Anus and perineum: Normal   Rectovaginal: Normal sphincter tone without palpated masses or tenderness.    Assessment/Plan:  72 y.o. 31P1001 female for annual gynecologic exampostmenopausal/atrophic genital changes/HRT..   1. Continues on estradiol 0.0375 patch.  Doing well and wants to continue.  We again reviewed the risks versus benefits to include the risk of thrombosis such as stroke heart attack DVT in the breast cancer issue.  Benefits as far as symptom relief and possible cardiovascular and bone health particularly with her osteoporosis discussed.  At this point the patient wants to continue and I refilled her x1 year. 2. Osteoporosis.  DEXA 2018 T score -2.7.  We discussed  treatment options 06/04/2017 and elected to start alendronate.  She never really started afterwards having read the information and was afraid of the side effects.  We discussed the risks versus benefits of both the medication as well as untreated osteoporosis with subsequent fractures.  I again reviewed the risks of osteonecrosis of the jaw, atypical fractures and GERD.  At this point the patient will go ahead and start alendronate.  I again reviewed how to take the medication.  Prescription x1 year provided.  We will plan repeat DEXA in another year or 2. 3. Mammography 03/2018.  Continue with annual mammography when due.  Breast exam normal today. 4. Pap smear 2012.  No Pap smear done today.  No history of significant abnormal Pap smears.  We both agreed to stop screening based on age and hysterectomy history per current screening guidelines. 5. Colonoscopy due this year and she is in the process of arranging. 6. Health maintenance.  No routine lab work done as patient does this elsewhere.  Follow-up 1 year, sooner as needed.   Dara Lordsimothy P Lavar Rosenzweig MD, 11:04 AM 05/01/2018

## 2018-05-01 NOTE — Patient Instructions (Signed)
Start on the alendronate as we discussed.  Call me if you have any issues with this.  Follow-up in 1 year, sooner as needed.

## 2018-07-14 IMAGING — DX DG CHEST 2V
2 series · 2 of 2 positions shown · non-contrast
Comparison: CT of the abdomen and pelvis 10/18/2014

CLINICAL DATA: Fatigue and shortness of breath.  Dry cough.

EXAM:
CHEST  2 VIEW

[chest pa]
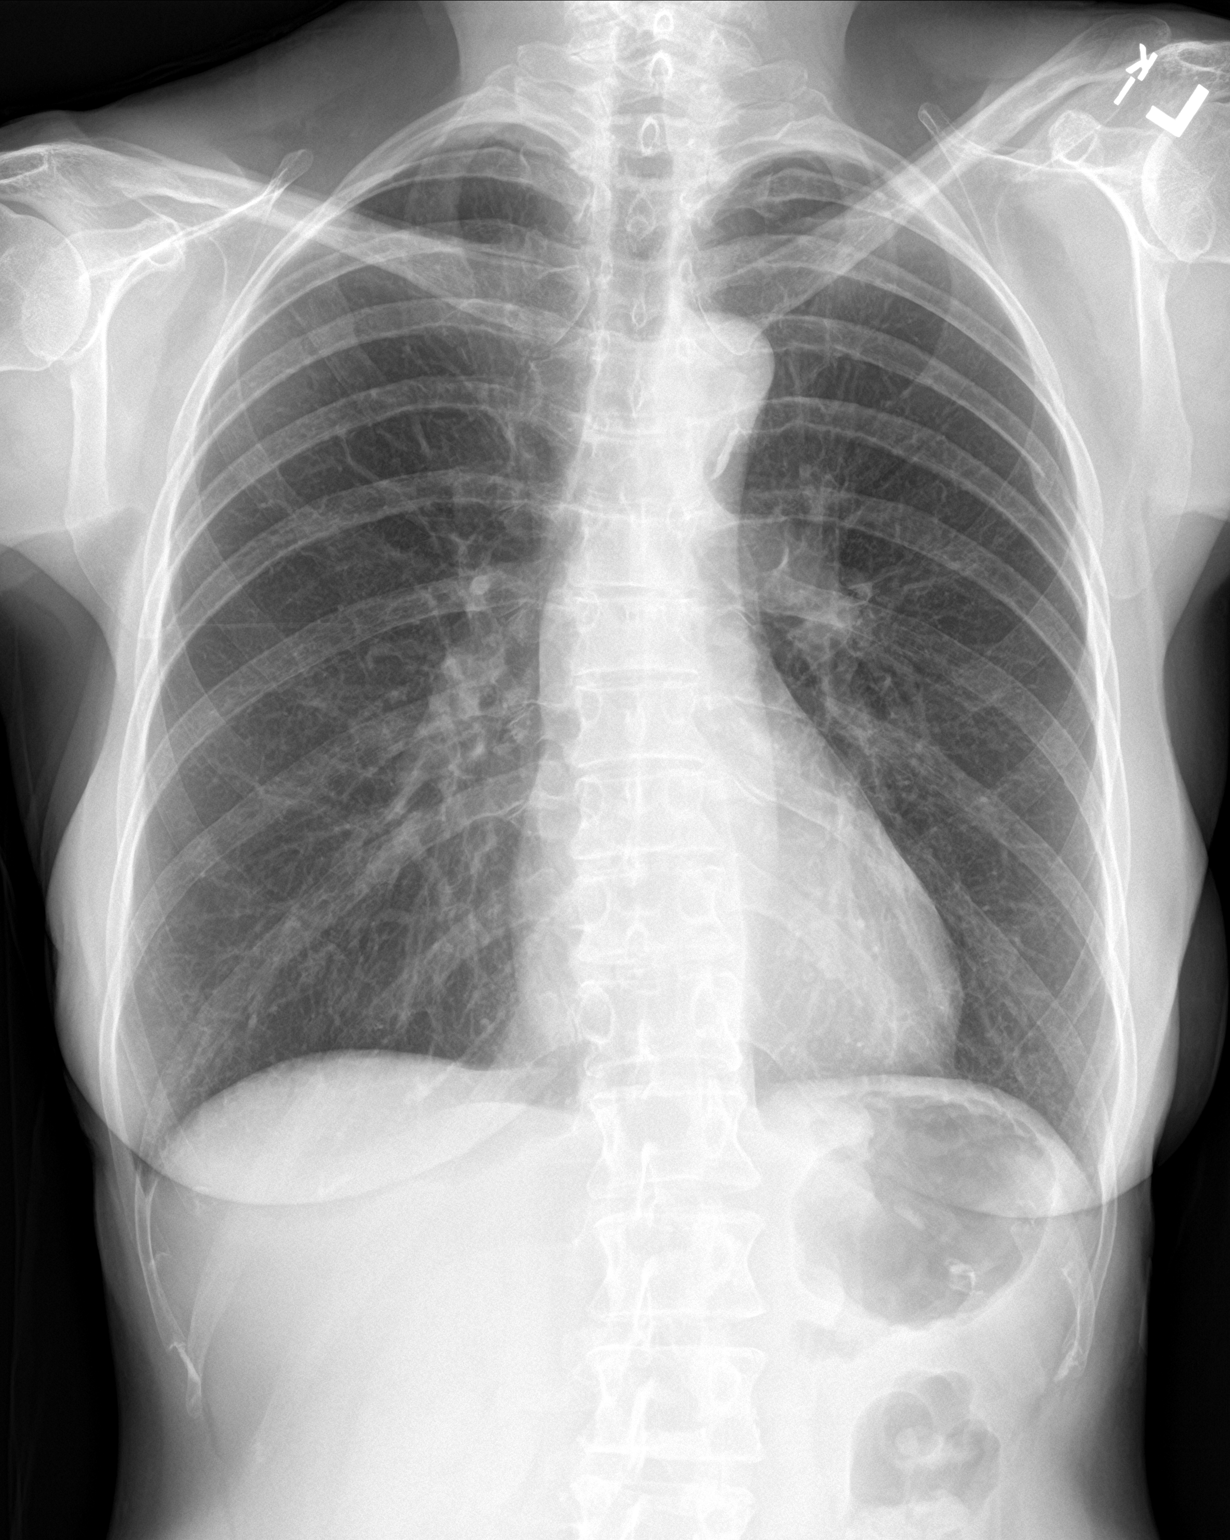

[chest lat]
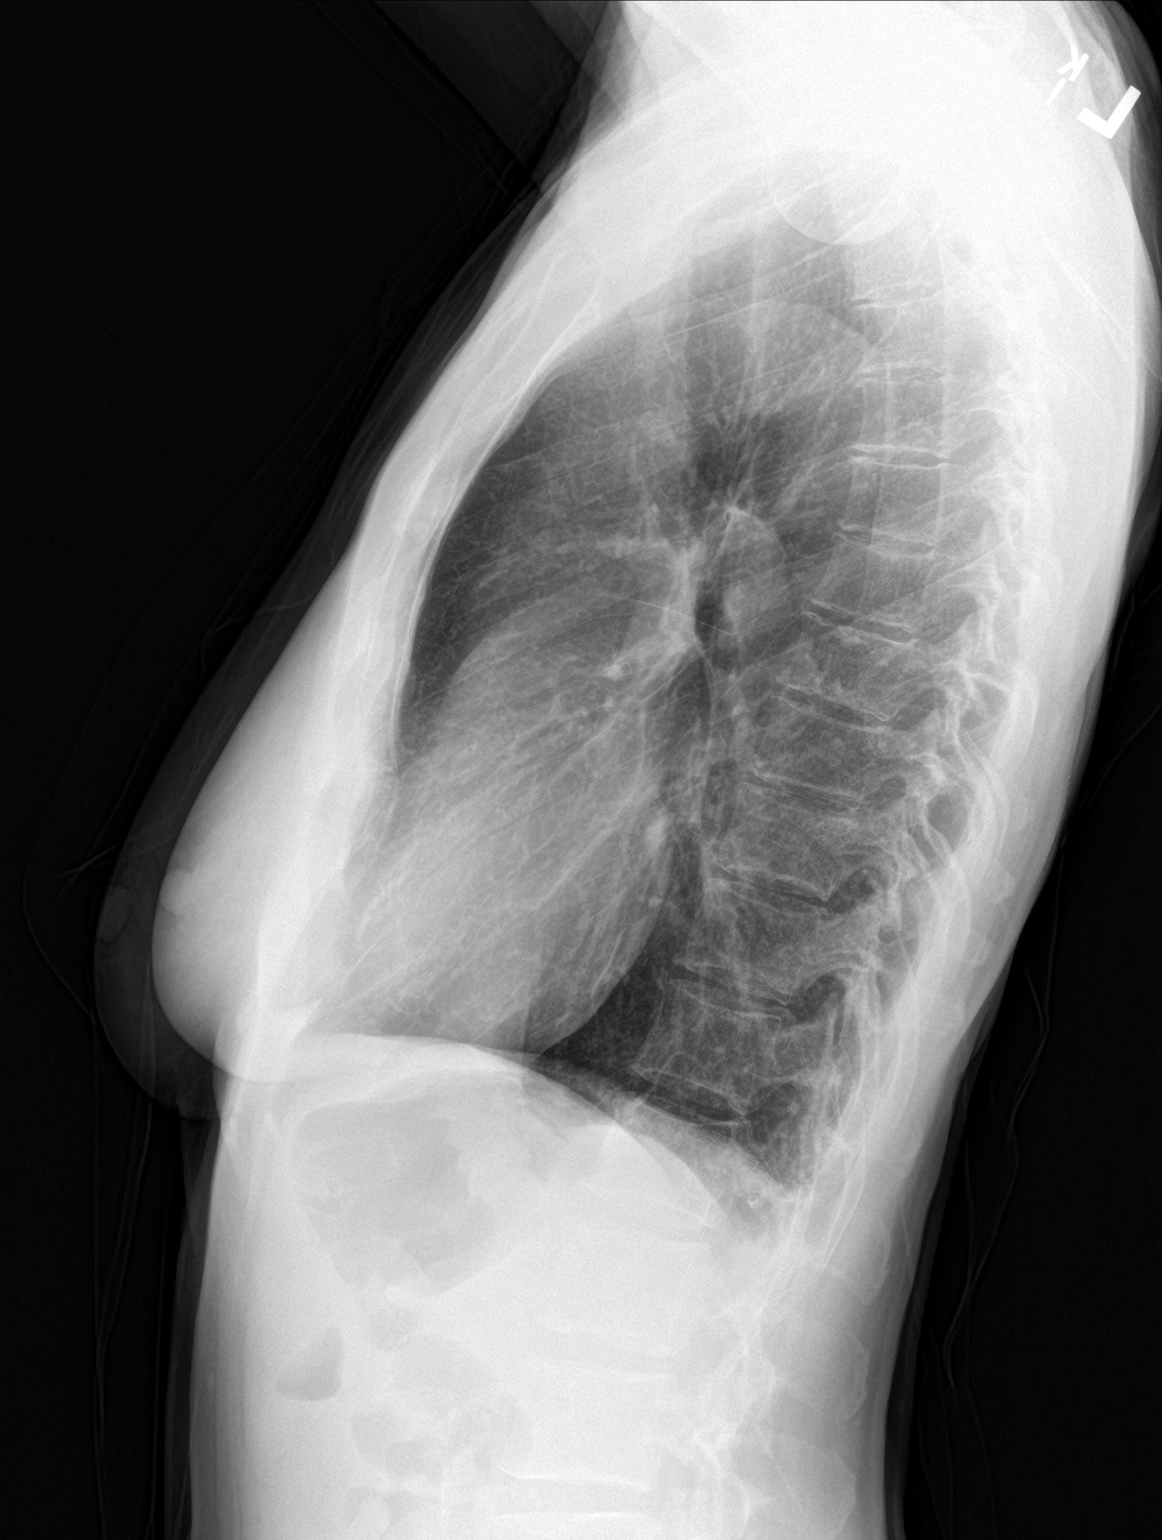

[2 of 2 positions shown; findings below may reference images not displayed]

FINDINGS: The heart size and mediastinal contours are within normal limits.
Both lungs are clear. The visualized skeletal structures are
unremarkable.
IMPRESSION: Negative two view chest x-ray

## 2018-07-15 ENCOUNTER — Other Ambulatory Visit: Payer: Self-pay | Admitting: Gynecology

## 2018-08-29 ENCOUNTER — Encounter: Payer: Self-pay | Admitting: Internal Medicine

## 2018-08-29 ENCOUNTER — Ambulatory Visit (INDEPENDENT_AMBULATORY_CARE_PROVIDER_SITE_OTHER): Payer: Medicare Other | Admitting: Internal Medicine

## 2018-08-29 VITALS — BP 136/82 | HR 64 | Temp 97.9°F | Ht 64.0 in | Wt 103.0 lb

## 2018-08-29 DIAGNOSIS — E785 Hyperlipidemia, unspecified: Secondary | ICD-10-CM

## 2018-08-29 DIAGNOSIS — R634 Abnormal weight loss: Secondary | ICD-10-CM | POA: Diagnosis not present

## 2018-08-29 DIAGNOSIS — Z23 Encounter for immunization: Secondary | ICD-10-CM | POA: Diagnosis not present

## 2018-08-29 DIAGNOSIS — Z0000 Encounter for general adult medical examination without abnormal findings: Secondary | ICD-10-CM | POA: Diagnosis not present

## 2018-08-29 DIAGNOSIS — I10 Essential (primary) hypertension: Secondary | ICD-10-CM | POA: Diagnosis not present

## 2018-08-29 NOTE — Assessment & Plan Note (Signed)
Wt Readings from Last 3 Encounters:  08/29/18 103 lb (46.7 kg)  05/01/18 104 lb (47.2 kg)  02/26/18 104 lb (47.2 kg)

## 2018-08-29 NOTE — Assessment & Plan Note (Signed)
Nl BP at home Valsartan

## 2018-08-29 NOTE — Progress Notes (Signed)
Subjective:  Patient ID: Brittany Mathis, female    DOB: [birth year]  Age: 72 y.o. MRN: [id number]  CC: No chief complaint on file.   HPI Brittany Mathis presents for osteoporosis, HTN, dyslipidemia f/u  Outpatient Medications Prior to Visit  Medication Sig Dispense Refill  . alendronate (FOSAMAX) 70 MG tablet Take 1 tablet (70 mg total) by mouth every 7 (seven) days. Take with a full glass of water on an empty stomach. 4 tablet 11  . cholecalciferol (VITAMIN D) 1000 UNITS tablet Take 1,000 Units by mouth daily.      [name] estradiol (VIVELLE-DOT) 0.0375 MG/24HR Place 1 patch onto the skin 2 (two) times a week. 24 patch 4  . simvastatin (ZOCOR) 20 MG tablet Take 1 tablet by mouth  every night at bedtime 90 tablet 3  . valsartan (DIOVAN) 160 MG tablet Take 1 tablet (160 mg total) by mouth daily. 90 tablet 3   No facility-administered medications prior to visit.     ROS: Review of Systems  Constitutional: Negative for activity change, appetite change, chills, fatigue and unexpected weight change.  HENT: Negative for congestion, mouth sores and sinus pressure.   Eyes: Negative for visual disturbance.  Respiratory: Negative for cough and chest tightness.   Gastrointestinal: Negative for abdominal pain and nausea.  Genitourinary: Negative for difficulty urinating, frequency and vaginal pain.  Musculoskeletal: Negative for back pain and gait problem.  Skin: Negative for pallor and rash.  Neurological: Negative for dizziness, tremors, weakness, numbness and headaches.  Psychiatric/Behavioral: Negative for confusion, sleep disturbance and suicidal ideas.    Objective:  BP 136/82 (BP Location: Left Arm, Patient Position: Sitting, Cuff Size: Normal)   Pulse 64   Temp 97.9 F (36.6 C) (Oral)   Ht 5\' 4"  (1.626 m)   Wt 103 lb (46.7 kg)   SpO2 98%   BMI 17.68 kg/m   BP Readings from Last 3 Encounters:  08/29/18 136/82  05/01/18 118/76  02/26/18 118/76    Wt Readings from Last 3 Encounters:    08/29/18 103 lb (46.7 kg)  05/01/18 104 lb (47.2 kg)  02/26/18 104 lb (47.2 kg)    Physical Exam  Constitutional: She appears well-developed. No distress.  HENT:  Head: Normocephalic.  Right Ear: External ear normal.  Left Ear: External ear normal.  Nose: Nose normal.  Mouth/Throat: Oropharynx is clear and moist.  Eyes: Pupils are equal, round, and reactive to light. Conjunctivae are normal. Right eye exhibits no discharge. Left eye exhibits no discharge.  Neck: Normal range of motion. Neck supple. No JVD present. No tracheal deviation present. No thyromegaly present.  Cardiovascular: Normal rate, regular rhythm and normal heart sounds.  Pulmonary/Chest: No stridor. No respiratory distress. She has no wheezes.  Abdominal: Soft. Bowel sounds are normal. She exhibits no distension and no mass. There is no tenderness. There is no rebound and no guarding.  Musculoskeletal: She exhibits no edema or tenderness.  Lymphadenopathy:    She has no cervical adenopathy.  Neurological: She displays normal reflexes. No cranial nerve deficit. She exhibits normal muscle tone. Coordination normal.  Skin: No rash noted. No erythema.  Psychiatric: She has a normal mood and affect. Her behavior is normal. Judgment and thought content normal.    Lab Results  Component Value Date   WBC 5.5 02/26/2018   HGB 13.7 02/26/2018   HCT 40.4 02/26/2018   PLT 261.0 02/26/2018   GLUCOSE 101 (H) 02/26/2018   CHOL 236 (H) 02/26/2018   TRIG 55.0  02/26/2018   HDL 107.40 02/26/2018   LDLDIRECT 109.2 02/05/2012   LDLCALC 118 (H) 02/26/2018   ALT 15 02/26/2018   AST 20 02/26/2018   NA 135 02/26/2018   K 4.8 02/26/2018   CL 98 02/26/2018   CREATININE 0.61 02/26/2018   BUN 17 02/26/2018   CO2 29 02/26/2018   TSH 1.85 02/26/2018    Dg Chest 2 View  Result Date: 04/04/2017 CLINICAL DATA:  Fatigue and shortness of breath.  Dry cough. EXAM: CHEST  2 VIEW COMPARISON:  CT of the abdomen and pelvis 10/18/2014  FINDINGS: The heart size and mediastinal contours are within normal limits. Both lungs are clear. The visualized skeletal structures are unremarkable. IMPRESSION: Negative two view chest x-ray Electronically Signed   By: Marin Robertshristopher  Mattern M.D.   On: 04/04/2017 13:20    Assessment & Plan:   There are no diagnoses linked to this encounter.   No orders of the defined types were placed in this encounter.    Follow-up: No follow-ups on file.  Sonda PrimesAlex Flavia Bruss, MD

## 2018-08-29 NOTE — Addendum Note (Signed)
Addended by: Scarlett PrestoFRIEDENBACH, Kadesha Virrueta on: 08/29/2018 10:10 AM   Modules accepted: Orders

## 2018-08-29 NOTE — Assessment & Plan Note (Signed)
Simvastatin 

## 2018-11-18 ENCOUNTER — Telehealth: Payer: Self-pay | Admitting: *Deleted

## 2018-11-18 NOTE — Telephone Encounter (Signed)
Patch approved until 10/16/19 per [employer].

## 2018-11-18 NOTE — Telephone Encounter (Signed)
Prior authorization done via cover my meds for estradiol patch 0.0375 mg will wait for response.

## 2018-11-30 ENCOUNTER — Other Ambulatory Visit: Payer: Self-pay | Admitting: Internal Medicine

## 2019-03-03 ENCOUNTER — Ambulatory Visit: Payer: Medicare Other | Admitting: Internal Medicine

## 2019-03-04 NOTE — Progress Notes (Addendum)
Subjective:   Brittany Mathis is a 73 y.o. female who presents for Medicare Annual (Subsequent) preventive examination.  Review of Systems:  No ROS.  Medicare Wellness Virtual Visit.  Visual/audio telehealth visit, UTA vital signs.   See social history for additional risk factors. I connected with patient by a telephone and verified that I am speaking with the correct person using two identifiers. Patient stated full name and DOB. Patient gave permission to continue with telephonic visit. Patient's location was at home and Nurse's location was at [place] office.   Cardiac Risk Factors include: dyslipidemia;advanced age (>32men, >63 women);hypertension Sleep patterns: feels rested on waking, gets up 0-1 times nightly to void and sleeps 8-9 hours nightly.    Home Safety/Smoke Alarms: Feels safe in home. Smoke alarms in place.  Living environment; residence and Firearm Safety: 2-story house . Lives with husband, no needs for DME, good support system Seat Belt Safety/Bike Helmet: Wears seat belt.     Objective:     Vitals: There were no vitals taken for this visit.  There is no height or weight on file to calculate BMI.  Advanced Directives 03/05/2019 10/18/2014  Does Patient Have a Medical Advance Directive? Yes No  Type of Estate agent of [place];Living will -  Copy of Healthcare Power of Attorney in Chart? No - copy requested -  Would patient like information on creating a medical advance directive? - No - patient declined information    Tobacco Social History   Tobacco Use  Smoking Status Never Smoker  Smokeless Tobacco Never Used  Tobacco Comment   experimented in college     Counseling given: Not Answered Comment: experimented in college  Past Medical History:  Diagnosis Date  . Cataract   . History of shingles   . Hyperlipidemia   . Hypertension   . Nephrolithiasis 1972  . Osteoporosis 05/2017   T score -2.7  . Thyroid disease 2010   thyroiditis   Past Surgical History:  Procedure Laterality Date  . ABDOMINAL HYSTERECTOMY  1983   with BSO  menorrhagia, endometriosis  . cataracts Bilateral 2018  . OOPHORECTOMY     BSO   Family History  Problem Relation Age of Onset  . Hypertension Mother   . Hyperlipidemia Mother   . Stroke Mother   . Hypertension Father   . Hyperlipidemia Father   . Glaucoma Father   . Kidney disease Father   . Colon cancer Maternal Aunt    Social History   Socioeconomic History  . Marital status: Married    Spouse name: Not on file  . Number of children: Not on file  . Years of education: Not on file  . Highest education level: Not on file  Occupational History  . Not on file  Social Needs  . Financial resource strain: Not hard at all  . Food insecurity:    Worry: Never true    Inability: Never true  . Transportation needs:    Medical: No    Non-medical: No  Tobacco Use  . Smoking status: Never Smoker  . Smokeless tobacco: Never Used  . Tobacco comment: experimented in college  Substance and Sexual Activity  . Alcohol use: Yes    Alcohol/week: 14.0 standard drinks    Types: 14 Standard drinks or equivalent per week  . Drug use: No  . Sexual activity: Yes    Birth control/protection: Surgical    Comment: HYST-1st intercourse 32 yo-1 partner  Lifestyle  . Physical  activity:    Days per week: 0 days    Minutes per session: 0 min  . Stress: Not at all  Relationships  . Social connections:    Talks on phone: More than three times a week    Gets together: More than three times a week    Attends religious service: 1 to 4 times per year    Active member of club or organization: Yes    Attends meetings of clubs or organizations: Not on file    Relationship status: Married  Other Topics Concern  . Not on file  Social History Narrative  . Not on file    Outpatient Encounter Medications as of 03/05/2019  Medication Sig  . alendronate (FOSAMAX) 70 MG tablet Take 1 tablet  (70 mg total) by mouth every 7 (seven) days. Take with a full glass of water on an empty stomach.  . cholecalciferol (VITAMIN D) 1000 UNITS tablet Take 1,000 Units by mouth daily.    [name] DIOVAN 160 MG tablet TAKE 1 TABLET BY MOUTH DAILY  . estradiol (VIVELLE-DOT) 0.0375 MG/24HR Place 1 patch onto the skin 2 (two) times a week.  . simvastatin (ZOCOR) 20 MG tablet Take 1 tablet by mouth  every night at bedtime   No facility-administered encounter medications on file as of 03/05/2019.     Activities of Daily Living In your present state of health, do you have any difficulty performing the following activities: 03/05/2019  Hearing? N  Vision? N  Difficulty concentrating or making decisions? N  Walking or climbing stairs? N  Dressing or bathing? N  Doing errands, shopping? N  Preparing Food and eating ? N  Using the Toilet? N  In the past six months, have you accidently leaked urine? N  Do you have problems with loss of bowel control? N  Managing your Medications? N  Managing your Finances? N  Housekeeping or managing your Housekeeping? N  Some recent data might be hidden    Patient Care Team: Plotnikov, Georgina Quint, MD as PCP - General (Internal Medicine) Sharrell Ku, MD (Gastroenterology) Jerilee Field, MD as Attending Physician (Urology) Donzetta Starch, MD as Consulting Physician (Dermatology) Audie Box Nadyne Coombes, MD as Consulting Physician (Gynecology) Antony Contras, MD as Consulting Physician (Ophthalmology)    Assessment:   This is a routine wellness examination for [name]. Physical assessment deferred to PCP.  Exercise Activities and Dietary recommendations Current Exercise Habits: Home exercise routine;The patient does not participate in regular exercise at present(very active on a daily basis), Intensity: Mild Diet (meal preparation, eat out, water intake, caffeinated beverages, dairy products, fruits and vegetables): in general, a "healthy" diet  , well balanced eats a  variety of fruits and vegetables daily, limits salt, fat/cholesterol, sugar,carbohydrates,caffeine, drinks 6-8 glasses of water daily.  Goals   None     Fall Risk Fall Risk  03/05/2019 08/29/2018 08/29/2017 08/21/2016 02/12/2015  Falls in the past year? 0 0 No No No  Number falls in past yr: 0 - - - -  Follow up - Falls evaluation completed - - -    Depression Screen PHQ 2/9 Scores 03/05/2019 08/29/2017 08/21/2016 02/12/2015  PHQ - 2 Score 0 0 0 0     Cognitive Function       Ad8 score reviewed for issues:  Issues making decisions: no  Less interest in hobbies / activities: no  Repeats questions, stories (family complaining): no  Trouble using ordinary gadgets (microwave, computer, phone):no  Forgets the month or year: no  Mismanaging finances: no  Remembering appts: no  Daily problems with thinking and/or memory: no Ad8 score is= 0  Immunization History  Administered Date(s) Administered  . Influenza, High Dose Seasonal PF 08/29/2018  . Pneumococcal Conjugate-13 09/24/2013  . Pneumococcal Polysaccharide-23 02/05/2012  . Td 02/05/2012  . Zoster 02/13/2013   Screening Tests Health Maintenance  Topic Date Due  . COLONOSCOPY  06/25/2018  . MAMMOGRAM  04/11/2019  . INFLUENZA VACCINE  05/17/2019  . TETANUS/TDAP  02/04/2022  . DEXA SCAN  Completed  . Hepatitis C Screening  Completed  . PNA vac Low Risk Adult  Completed      Plan:     Reviewed health maintenance screenings with patient today and relevant education, vaccines, and/or referrals were provided.   Continue to eat heart healthy diet (full of fruits, vegetables, whole grains, lean protein, water--limit salt, fat, and sugar intake) and increase physical activity as tolerated.  Continue doing brain stimulating activities (puzzles, reading, adult coloring books, staying active) to keep memory sharp.   I have personally reviewed and noted the following in the patient's chart:   . Medical and social  history . Use of alcohol, tobacco or illicit drugs  . Current medications and supplements . Functional ability and status . Nutritional status . Physical activity . Advanced directives . List of other physicians . Screenings to include cognitive, depression, and falls . Referrals and appointments  In addition, I have reviewed and discussed with patient certain preventive protocols, quality metrics, and best practice recommendations. A written personalized care plan for preventive services as well as general preventive health recommendations were provided to patient.     Wanda PlumpJill A Jacy Brocker, RN  03/05/2019 Medical screening examination/treatment/procedure(s) were performed by non-physician practitioner and as supervising physician I was immediately available for consultation/collaboration. I agree with above. Jacinta ShoeAleksei Plotnikov, MD

## 2019-03-05 ENCOUNTER — Ambulatory Visit (INDEPENDENT_AMBULATORY_CARE_PROVIDER_SITE_OTHER): Payer: Medicare Other | Admitting: *Deleted

## 2019-03-05 DIAGNOSIS — Z0000 Encounter for general adult medical examination without abnormal findings: Secondary | ICD-10-CM | POA: Diagnosis not present

## 2019-03-11 ENCOUNTER — Ambulatory Visit: Payer: Medicare Other | Admitting: Internal Medicine

## 2019-04-01 ENCOUNTER — Other Ambulatory Visit: Payer: Self-pay | Admitting: Internal Medicine

## 2019-04-16 ENCOUNTER — Encounter: Payer: Self-pay | Admitting: Gynecology

## 2019-04-21 ENCOUNTER — Telehealth: Payer: Self-pay | Admitting: Internal Medicine

## 2019-04-21 NOTE — Telephone Encounter (Signed)
Pt went to pick up simvastatin and its was for only 30 day supply not 90. Pt would like 90 day supply simvastatin 20 mg. [facility] on [address]

## 2019-04-22 MED ORDER — SIMVASTATIN 20 MG PO TABS: 20.0000 mg | ORAL_TABLET | Freq: Every day | ORAL | 3 refills | Status: DC

## 2019-04-22 NOTE — Telephone Encounter (Signed)
RX resent

## 2019-05-05 ENCOUNTER — Encounter: Payer: Medicare Other | Admitting: Gynecology

## 2019-05-06 ENCOUNTER — Ambulatory Visit (INDEPENDENT_AMBULATORY_CARE_PROVIDER_SITE_OTHER): Payer: Medicare Other | Admitting: Internal Medicine

## 2019-05-06 ENCOUNTER — Other Ambulatory Visit: Payer: Self-pay

## 2019-05-06 ENCOUNTER — Other Ambulatory Visit (INDEPENDENT_AMBULATORY_CARE_PROVIDER_SITE_OTHER): Payer: Medicare Other

## 2019-05-06 ENCOUNTER — Encounter: Payer: Self-pay | Admitting: Internal Medicine

## 2019-05-06 DIAGNOSIS — Z0000 Encounter for general adult medical examination without abnormal findings: Secondary | ICD-10-CM | POA: Diagnosis not present

## 2019-05-06 DIAGNOSIS — E785 Hyperlipidemia, unspecified: Secondary | ICD-10-CM

## 2019-05-06 DIAGNOSIS — R634 Abnormal weight loss: Secondary | ICD-10-CM | POA: Diagnosis not present

## 2019-05-06 DIAGNOSIS — I10 Essential (primary) hypertension: Secondary | ICD-10-CM | POA: Diagnosis not present

## 2019-05-06 LAB — CBC WITH DIFFERENTIAL/PLATELET
Basophils Absolute: 0.1 10*3/uL (ref 0.0–0.1)
Basophils Relative: 1.3 % (ref 0.0–3.0)
Eosinophils Absolute: 0.1 10*3/uL (ref 0.0–0.7)
Eosinophils Relative: 2.9 % (ref 0.0–5.0)
HCT: 39.5 % (ref 36.0–46.0)
Hemoglobin: 13.3 g/dL (ref 12.0–15.0)
Lymphocytes Relative: 35.2 % (ref 12.0–46.0)
Lymphs Abs: 1.6 10*3/uL (ref 0.7–4.0)
MCHC: 33.5 g/dL (ref 30.0–36.0)
MCV: 89.5 fl (ref 78.0–100.0)
Monocytes Absolute: 0.5 10*3/uL (ref 0.1–1.0)
Monocytes Relative: 11.3 % (ref 3.0–12.0)
Neutro Abs: 2.3 10*3/uL (ref 1.4–7.7)
Neutrophils Relative %: 49.3 % (ref 43.0–77.0)
Platelets: 226 10*3/uL (ref 150.0–400.0)
RBC: 4.42 Mil/uL (ref 3.87–5.11)
RDW: 12.4 % (ref 11.5–15.5)
WBC: 4.6 10*3/uL (ref 4.0–10.5)

## 2019-05-06 LAB — BASIC METABOLIC PANEL
BUN: 12 mg/dL (ref 6–23)
CO2: 27 mEq/L (ref 19–32)
Calcium: 9.5 mg/dL (ref 8.4–10.5)
Chloride: 93 mEq/L — ABNORMAL LOW (ref 96–112)
Creatinine, Ser: 0.58 mg/dL (ref 0.40–1.20)
GFR: 101.46 mL/min (ref >60.00)
Glucose, Bld: 96 mg/dL (ref 70–99)
Potassium: 5.7 mEq/L — ABNORMAL HIGH (ref 3.5–5.1)
Sodium: 126 mEq/L — ABNORMAL LOW (ref 135–145)

## 2019-05-06 LAB — URINALYSIS
Bilirubin Urine: NEGATIVE
Hgb urine dipstick: NEGATIVE
Leukocytes,Ua: NEGATIVE
Nitrite: NEGATIVE
Specific Gravity, Urine: 1.01 (ref 1.000–1.030)
Total Protein, Urine: NEGATIVE
Urine Glucose: NEGATIVE
Urobilinogen, UA: 0.2 (ref 0.0–1.0)
pH: 7 (ref 5.0–8.0)

## 2019-05-06 LAB — HEPATIC FUNCTION PANEL
ALT: 16 U/L (ref 0–35)
AST: 22 U/L (ref 0–37)
Albumin: 4.7 g/dL (ref 3.5–5.2)
Alkaline Phosphatase: 35 U/L — ABNORMAL LOW (ref 39–117)
Bilirubin, Direct: 0.1 mg/dL (ref 0.0–0.3)
Total Bilirubin: 0.6 mg/dL (ref 0.2–1.2)
Total Protein: 7.1 g/dL (ref 6.0–8.3)

## 2019-05-06 LAB — LIPID PANEL
Cholesterol: 208 mg/dL — ABNORMAL HIGH (ref 0–200)
HDL: 101.1 mg/dL (ref >39.00)
LDL Cholesterol: 94 mg/dL (ref 0–99)
NonHDL: 107.39
Total CHOL/HDL Ratio: 2
Triglycerides: 66 mg/dL (ref 0.0–149.0)
VLDL: 13.2 mg/dL (ref 0.0–40.0)

## 2019-05-06 LAB — TSH: TSH: 2.02 u[IU]/mL (ref 0.35–4.50)

## 2019-05-06 NOTE — Patient Instructions (Addendum)
If you have medicare related insurance (such as traditional Medicare, Blue [employer], [employer], or similar), Please make an appointment at the scheduling desk with Sharee Pimple, the [employer], for your Wellness visit in this office, which is a benefit with your insurance.      Mediterranean diet is good for you.  The Mediterranean diet is a way of eating based on the traditional cuisine of countries bordering the [employer]. While there is no single definition of the Mediterranean diet, it is typically high in vegetables, fruits, whole grains, beans, nut and seeds, and olive oil. The main components of Mediterranean diet include: [name] Daily consumption of vegetables, fruits, whole grains and healthy fats  . Weekly intake of fish, poultry, beans and eggs  . Moderate portions of dairy products  . Limited intake of red meat Other important elements of the Mediterranean diet are sharing meals with family and friends, enjoying a glass of red wine and being physically active. Health benefits of a Mediterranean diet: A traditional Mediterranean diet consisting of large quantities of fresh fruits and vegetables, nuts, fish and olive oil-coupled with physical activity-can reduce your risk of serious mental and physical health problems by: Preventing heart disease and strokes. Following a Mediterranean diet limits your intake of refined breads, processed foods, and red meat, and encourages drinking red wine instead of hard liquor-all factors that can help prevent heart disease and stroke. Keeping you agile. If you're an older adult, the nutrients gained with a Mediterranean diet may reduce your risk of developing muscle weakness and other signs of frailty by about 70 percent. Reducing the risk of Alzheimer's. Research suggests that the [place] diet may improve cholesterol, blood sugar levels, and overall blood vessel health, which in turn may reduce your risk of  Alzheimer's disease or dementia. Halving the risk of Parkinson's disease. The high levels of antioxidants in the Mediterranean diet can prevent cells from undergoing a damaging process called oxidative stress, thereby cutting the risk of Parkinson's disease in half. Increasing longevity. By reducing your risk of developing heart disease or cancer with the Mediterranean diet, you're reducing your risk of death at any age by 20%. Protecting against type 2 diabetes. A Mediterranean diet is rich in fiber which digests slowly, prevents huge swings in blood sugar, and can help you maintain a healthy weight.    Cardiac CT calcium scoring test $150   Computed tomography, more commonly known as a CT or CAT scan, is a diagnostic medical imaging test. Like traditional x-rays, it produces multiple images or pictures of the inside of the body. The cross-sectional images generated during a CT scan can be reformatted in multiple planes. They can even generate three-dimensional images. These images can be viewed on a computer monitor, printed on film or by a 3D printer, or transferred to a CD or DVD. CT images of internal organs, bones, soft tissue and blood vessels provide greater detail than traditional x-rays, particularly of soft tissues and blood vessels. A cardiac CT scan for coronary calcium is a non-invasive way of obtaining information about the presence, location and extent of calcified plaque in the coronary arteries-the vessels that supply oxygen-containing blood to the heart muscle. Calcified plaque results when there is a build-up of fat and other substances under the inner layer of the artery. This material can calcify which signals the presence of atherosclerosis, a disease of the vessel wall, also called coronary artery disease (CAD). People with this disease have an increased risk  for heart attacks. In addition, over time, progression of plaque build up (CAD) can narrow the arteries or even close off  blood flow to the heart. The result may be chest pain, sometimes called "angina," or a heart attack. Because calcium is a marker of CAD, the amount of calcium detected on a cardiac CT scan is a helpful prognostic tool. The findings on cardiac CT are expressed as a calcium score. Another name for this test is coronary artery calcium scoring.  What are some common uses of the procedure? The goal of cardiac CT scan for calcium scoring is to determine if CAD is present and to what extent, even if there are no symptoms. It is a screening study that may be recommended by a physician for patients with risk factors for CAD but no clinical symptoms. The major risk factors for CAD are: . high blood cholesterol levels  . family history of heart attacks  . diabetes  . high blood pressure  . cigarette smoking  . overweight or obese  . physical inactivity   A negative cardiac CT scan for calcium scoring shows no calcification within the coronary arteries. This suggests that CAD is absent or so minimal it cannot be seen by this technique. The chance of having a heart attack over the next two to five years is very low under these circumstances. A positive test means that CAD is present, regardless of whether or not the patient is experiencing any symptoms. The amount of calcification-expressed as the calcium score-may help to predict the likelihood of a myocardial infarction (heart attack) in the coming years and helps your medical doctor or cardiologist decide whether the patient may need to take preventive medicine or undertake other measures such as diet and exercise to lower the risk for heart attack. The extent of CAD is graded according to your calcium score:  Calcium Score  Presence of CAD (coronary artery disease)  0 No evidence of CAD   1-10 Minimal evidence of CAD  11-100 Mild evidence of CAD  101-400 Moderate evidence of CAD  Over 400 Extensive evidence of CAD

## 2019-05-06 NOTE — Assessment & Plan Note (Signed)
Valsartan 

## 2019-05-06 NOTE — Assessment & Plan Note (Signed)
A cardiac CT scan for coronary calcium offered On Simvastatin 

## 2019-05-06 NOTE — Assessment & Plan Note (Signed)
Diet discussed 

## 2019-05-06 NOTE — Progress Notes (Signed)
Subjective:  Patient ID: Brittany Mathis, female    DOB: 1946-08-16  Age: 73 y.o. MRN: [id number]  CC: No chief complaint on file.   HPI Brittany Fusinn H Shupert presents for osteoporosis, HTN, dyslipidemia f/u  Outpatient Medications Prior to Visit  Medication Sig Dispense Refill  . alendronate (FOSAMAX) 70 MG tablet Take 1 tablet (70 mg total) by mouth every 7 (seven) days. Take with a full glass of water on an empty stomach. 4 tablet 11  . cholecalciferol (VITAMIN D) 1000 UNITS tablet Take 1,000 Units by mouth daily.      [name]. DIOVAN 160 MG tablet TAKE 1 TABLET BY MOUTH DAILY 90 tablet 3  . estradiol (VIVELLE-DOT) 0.0375 MG/24HR Place 1 patch onto the skin 2 (two) times a week. 24 patch 4  . simvastatin (ZOCOR) 20 MG tablet Take 1 tablet (20 mg total) by mouth at bedtime. 90 tablet 3   No facility-administered medications prior to visit.     ROS: Review of Systems  Constitutional: Positive for unexpected weight change. Negative for activity change, appetite change, chills and fatigue.  HENT: Negative for congestion, mouth sores and sinus pressure.   Eyes: Negative for visual disturbance.  Respiratory: Negative for cough and chest tightness.   Gastrointestinal: Negative for abdominal pain and nausea.  Genitourinary: Negative for difficulty urinating, frequency and vaginal pain.  Musculoskeletal: Negative for back pain and gait problem.  Skin: Negative for pallor and rash.  Neurological: Negative for dizziness, tremors, weakness, numbness and headaches.  Psychiatric/Behavioral: Negative for confusion, sleep disturbance and suicidal ideas.    Objective:  BP 138/90 (BP Location: Left Arm, Patient Position: Sitting, Cuff Size: Normal)   Pulse 79   Temp 97.7 F (36.5 C) (Oral)   Ht 5\' 4"  (1.626 m)   Wt 99 lb (44.9 kg)   SpO2 98%   BMI 16.99 kg/m   BP Readings from Last 3 Encounters:  05/06/19 138/90  08/29/18 136/82  05/01/18 118/76    Wt Readings from Last 3 Encounters:  05/06/19 99  lb (44.9 kg)  08/29/18 103 lb (46.7 kg)  05/01/18 104 lb (47.2 kg)    Physical Exam Constitutional:      General: She is not in acute distress.    Appearance: She is well-developed.  HENT:     Head: Normocephalic.     Right Ear: External ear normal.     Left Ear: External ear normal.     Nose: Nose normal.  Eyes:     General:        Right eye: No discharge.        Left eye: No discharge.     Conjunctiva/sclera: Conjunctivae normal.     Pupils: Pupils are equal, round, and reactive to light.  Neck:     Musculoskeletal: Normal range of motion and neck supple.     Thyroid: No thyromegaly.     Vascular: No JVD.     Trachea: No tracheal deviation.  Cardiovascular:     Rate and Rhythm: Normal rate and regular rhythm.     Heart sounds: Normal heart sounds.  Pulmonary:     Effort: No respiratory distress.     Breath sounds: No stridor. No wheezing.  Abdominal:     General: Bowel sounds are normal. There is no distension.     Palpations: Abdomen is soft. There is no mass.     Tenderness: There is no abdominal tenderness. There is no guarding or rebound.  Musculoskeletal:  General: No tenderness.  Lymphadenopathy:     Cervical: No cervical adenopathy.  Skin:    Findings: No erythema or rash.  Neurological:     Cranial Nerves: No cranial nerve deficit.     Motor: No abnormal muscle tone.     Coordination: Coordination normal.     Deep Tendon Reflexes: Reflexes normal.  Psychiatric:        Behavior: Behavior normal.        Thought Content: Thought content normal.        Judgment: Judgment normal.   Thin  Lab Results  Component Value Date   WBC 5.5 02/26/2018   HGB 13.7 02/26/2018   HCT 40.4 02/26/2018   PLT 261.0 02/26/2018   GLUCOSE 101 (H) 02/26/2018   CHOL 236 (H) 02/26/2018   TRIG 55.0 02/26/2018   HDL 107.40 02/26/2018   LDLDIRECT 109.2 02/05/2012   LDLCALC 118 (H) 02/26/2018   ALT 15 02/26/2018   AST 20 02/26/2018   NA 135 02/26/2018   K 4.8  02/26/2018   CL 98 02/26/2018   CREATININE 0.61 02/26/2018   BUN 17 02/26/2018   CO2 29 02/26/2018   TSH 1.85 02/26/2018    Dg Chest 2 View  Result Date: 04/04/2017 CLINICAL DATA:  Fatigue and shortness of breath.  Dry cough. EXAM: CHEST  2 VIEW COMPARISON:  CT of the abdomen and pelvis 10/18/2014 FINDINGS: The heart size and mediastinal contours are within normal limits. Both lungs are clear. The visualized skeletal structures are unremarkable. IMPRESSION: Negative two view chest x-ray Electronically Signed   By: San Morelle M.D.   On: 04/04/2017 13:20    Assessment & Plan:   There are no diagnoses linked to this encounter.   No orders of the defined types were placed in this encounter.    Follow-up: No follow-ups on file.  Walker Kehr, MD

## 2019-05-07 ENCOUNTER — Other Ambulatory Visit: Payer: Self-pay | Admitting: Internal Medicine

## 2019-05-07 ENCOUNTER — Other Ambulatory Visit: Payer: Self-pay

## 2019-05-07 DIAGNOSIS — I10 Essential (primary) hypertension: Secondary | ICD-10-CM

## 2019-05-07 MED ORDER — DIOVAN 160 MG PO TABS: 80.0000 mg | ORAL_TABLET | Freq: Every day | ORAL | 3 refills | Status: DC

## 2019-05-08 ENCOUNTER — Encounter: Payer: Self-pay | Admitting: Gynecology

## 2019-05-08 ENCOUNTER — Ambulatory Visit (INDEPENDENT_AMBULATORY_CARE_PROVIDER_SITE_OTHER): Payer: Medicare Other | Admitting: Gynecology

## 2019-05-08 VITALS — BP 116/76 | Ht 63.5 in | Wt 99.0 lb

## 2019-05-08 DIAGNOSIS — Z79890 Hormone replacement therapy (postmenopausal): Secondary | ICD-10-CM | POA: Diagnosis not present

## 2019-05-08 DIAGNOSIS — Z01419 Encounter for gynecological examination (general) (routine) without abnormal findings: Secondary | ICD-10-CM | POA: Diagnosis not present

## 2019-05-08 DIAGNOSIS — M810 Age-related osteoporosis without current pathological fracture: Secondary | ICD-10-CM | POA: Diagnosis not present

## 2019-05-08 DIAGNOSIS — N952 Postmenopausal atrophic vaginitis: Secondary | ICD-10-CM | POA: Diagnosis not present

## 2019-05-08 MED ORDER — ALENDRONATE SODIUM 70 MG PO TABS: 70.0000 mg | ORAL_TABLET | ORAL | 4 refills | Status: DC

## 2019-05-08 MED ORDER — ESTRADIOL 0.0375 MG/24HR TD PTTW: 1.0000 | MEDICATED_PATCH | TRANSDERMAL | 4 refills | Status: DC

## 2019-05-08 NOTE — Progress Notes (Signed)
    Brittany Mathis 10/07/1946 [id number]        72 y.o.  [name] for annual gynecologic exam.  Without gynecologic complaints  Past medical history,surgical history, problem list, medications, allergies, family history and social history were all reviewed and documented as reviewed in the EPIC chart.  ROS:  Performed with pertinent positives and negatives included in the history, assessment and plan.   Additional significant findings : None   Exam: Caryn Bee assistant Vitals:   05/08/19 1018  BP: 116/76  Weight: 99 lb (44.9 kg)  Height: 5' 3.5" (1.613 m)   Body mass index is 17.26 kg/m.  General appearance:  Normal affect, orientation and appearance. Skin: Grossly normal HEENT: Without gross lesions.  No cervical or supraclavicular adenopathy. Thyroid normal.  Lungs:  Clear without wheezing, rales or rhonchi Cardiac: RR, without RMG Abdominal:  Soft, nontender, without masses, guarding, rebound, organomegaly or hernia Breasts:  Examined lying and sitting without masses, retractions, discharge or axillary adenopathy. Pelvic:  Ext, BUS, Vagina: Normal with atrophic changes  Adnexa: Without masses or tenderness    Anus and perineum: Normal   Rectovaginal: Normal sphincter tone without palpated masses or tenderness.    Assessment/Plan:  73 y.o. G34P1001 female for annual gynecologic exam.  Status post TAH/BSO for endometriosis and menorrhagia 1983.  1. Postmenopausal/HRT.  Continues on estradiol 0.0375 patch changing every 5 days.  Doing well with this and wants to continue.  We again discussed the issues of HRT and risks to include thrombosis as well as the breast cancer issue.  At this point she feels comfortable continuing and I refilled her x1 year. 2. Osteoporosis.  DEXA 2018 T score -2.7.  Was to start alendronate but never did until last year.  Has been on it now for 1 year.  Recommend repeating DEXA next year after 2 years of treatment for more realistic follow-up and she  agrees with this.  We will continue on alendronate for now doing well on it.  Refill x1 year provided.  Also on her low-dose estrogen patch which is bone beneficial. 3. Colonoscopy 2009.  Will discuss colon screening recommendations with her primary provider. 4. Mammography 04/2019.  Continue with annual mammography next year.  Breast exam normal today. 5. Pap smear 2012.  No Pap smear done today.  No history of significant abnormal Pap smears.  We agree to stop screening per current screening guidelines. 6. Health maintenance.  No routine lab work done as patient does this elsewhere.  Follow-up 1 year, sooner as needed.   Anastasio Auerbach MD, 10:48 AM 05/08/2019

## 2019-05-08 NOTE — Patient Instructions (Signed)
Follow-up in 1 year for annual exam, sooner if any issues. 

## 2019-05-27 ENCOUNTER — Other Ambulatory Visit (INDEPENDENT_AMBULATORY_CARE_PROVIDER_SITE_OTHER): Payer: Medicare Other

## 2019-05-27 DIAGNOSIS — I10 Essential (primary) hypertension: Secondary | ICD-10-CM

## 2019-05-27 LAB — BASIC METABOLIC PANEL
BUN: 13 mg/dL (ref 6–23)
CO2: 27 mEq/L (ref 19–32)
Calcium: 9.5 mg/dL (ref 8.4–10.5)
Chloride: 97 mEq/L (ref 96–112)
Creatinine, Ser: 0.63 mg/dL (ref 0.40–1.20)
GFR: 92.21 mL/min (ref >60.00)
Glucose, Bld: 90 mg/dL (ref 70–99)
Potassium: 5 mEq/L (ref 3.5–5.1)
Sodium: 131 mEq/L — ABNORMAL LOW (ref 135–145)

## 2019-06-02 ENCOUNTER — Telehealth: Payer: Self-pay

## 2019-06-02 NOTE — Telephone Encounter (Signed)
Copied from [place] [id number]. Topic: General - Other >> May 30, 2019 11:21 AM Alanda Slim E wrote: Reason for CRM: Pt would like a call from Dr. Judeen Hammans nurse to go over her lab results and continuation of meds / please advise

## 2019-06-02 NOTE — Telephone Encounter (Signed)
Patient stated she didn't need to talk to the nurse because Dr. Alain Marion sent her a message in [place] and she knows what to do.

## 2019-06-02 NOTE — Telephone Encounter (Signed)
LVM for patient to call back to go over results.

## 2019-06-02 NOTE — Telephone Encounter (Signed)
noted 

## 2019-06-19 ENCOUNTER — Other Ambulatory Visit: Payer: Self-pay | Admitting: Gynecology

## 2019-06-25 ENCOUNTER — Ambulatory Visit (INDEPENDENT_AMBULATORY_CARE_PROVIDER_SITE_OTHER)
Admission: RE | Admit: 2019-06-25 | Discharge: 2019-06-25 | Disposition: A | Discharge status: Home / Self Care | Payer: Self-pay | Source: Ambulatory Visit | Attending: Internal Medicine | Admitting: Internal Medicine

## 2019-06-25 ENCOUNTER — Other Ambulatory Visit: Payer: Self-pay

## 2019-06-25 DIAGNOSIS — E785 Hyperlipidemia, unspecified: Secondary | ICD-10-CM

## 2019-06-27 ENCOUNTER — Other Ambulatory Visit: Payer: Self-pay | Admitting: Internal Medicine

## 2019-06-27 ENCOUNTER — Encounter: Payer: Self-pay | Admitting: Internal Medicine

## 2019-06-27 DIAGNOSIS — R931 Abnormal findings on diagnostic imaging of heart and coronary circulation: Secondary | ICD-10-CM

## 2019-07-14 ENCOUNTER — Other Ambulatory Visit: Payer: Self-pay

## 2019-07-14 ENCOUNTER — Ambulatory Visit (INDEPENDENT_AMBULATORY_CARE_PROVIDER_SITE_OTHER): Payer: Medicare Other

## 2019-07-14 DIAGNOSIS — Z23 Encounter for immunization: Secondary | ICD-10-CM | POA: Diagnosis not present

## 2019-07-24 ENCOUNTER — Encounter: Payer: Self-pay | Admitting: Gynecology

## 2019-09-02 ENCOUNTER — Ambulatory Visit: Payer: Medicare Other | Admitting: Cardiology

## 2019-09-03 ENCOUNTER — Ambulatory Visit: Payer: Medicare Other | Admitting: Cardiology

## 2019-09-03 ENCOUNTER — Other Ambulatory Visit: Payer: Self-pay

## 2019-09-03 ENCOUNTER — Encounter: Payer: Self-pay | Admitting: Cardiology

## 2019-09-03 VITALS — BP 134/84 | HR 79 | Ht 64.0 in | Wt 100.8 lb

## 2019-09-03 DIAGNOSIS — R931 Abnormal findings on diagnostic imaging of heart and coronary circulation: Secondary | ICD-10-CM

## 2019-09-03 DIAGNOSIS — E785 Hyperlipidemia, unspecified: Secondary | ICD-10-CM | POA: Diagnosis not present

## 2019-09-03 DIAGNOSIS — R079 Chest pain, unspecified: Secondary | ICD-10-CM | POA: Diagnosis not present

## 2019-09-03 DIAGNOSIS — I10 Essential (primary) hypertension: Secondary | ICD-10-CM | POA: Diagnosis not present

## 2019-09-03 LAB — CBC WITH DIFFERENTIAL/PLATELET
Basophils Absolute: 0 10*3/uL (ref 0.0–0.2)
Basos: 0 %
EOS (ABSOLUTE): 0.1 10*3/uL (ref 0.0–0.4)
Eos: 2 %
Hematocrit: 35.8 % (ref 34.0–46.6)
Hemoglobin: 12.5 g/dL (ref 11.1–15.9)
Lymphocytes Absolute: 1.7 10*3/uL (ref 0.7–3.1)
Lymphs: 30 %
MCH: 30.4 pg (ref 26.6–33.0)
MCHC: 34.9 g/dL (ref 31.5–35.7)
MCV: 87 fL (ref 79–97)
Monocytes Absolute: 0.7 10*3/uL (ref 0.1–0.9)
Monocytes: 12 %
Neutrophils Absolute: 3.3 10*3/uL (ref 1.4–7.0)
Neutrophils: 56 %
Platelets: 222 10*3/uL (ref 150–450)
RBC: 4.11 x10E6/uL (ref 3.77–5.28)
RDW: 12.5 % (ref 11.7–15.4)
WBC: 5.8 10*3/uL (ref 3.4–10.8)

## 2019-09-03 LAB — BASIC METABOLIC PANEL
BUN/Creatinine Ratio: 26 (ref 12–28)
BUN: 15 mg/dL (ref 8–27)
CO2: 27 mmol/L (ref 20–29)
Calcium: 9.3 mg/dL (ref 8.7–10.3)
Chloride: 99 mmol/L (ref 96–106)
Creatinine, Ser: 0.57 mg/dL (ref 0.57–1.00)
GFR calc Af Amer: 106 mL/min/{1.73_m2} (ref >59)
GFR calc non Af Amer: 92 mL/min/{1.73_m2} (ref >59)
Glucose: 95 mg/dL (ref 65–99)
Potassium: 5 mmol/L (ref 3.5–5.2)
Sodium: 133 mmol/L — ABNORMAL LOW (ref 134–144)

## 2019-09-03 LAB — TROPONIN T: Troponin T TROPT: <0.011 ng/mL (ref <0.011)

## 2019-09-03 MED ORDER — ATORVASTATIN CALCIUM 40 MG PO TABS: 40.0000 mg | ORAL_TABLET | Freq: Every day | ORAL | 3 refills | Status: DC

## 2019-09-03 MED ORDER — METOPROLOL SUCCINATE ER 25 MG PO TB24: 25.0000 mg | ORAL_TABLET | Freq: Every day | ORAL | 11 refills | Status: DC

## 2019-09-03 NOTE — Patient Instructions (Addendum)
MEDICATION INSTRUCTIONS: 1) STOP SIMVASTATIN 2) START LIPITOR 40 mg daily We will schedule you for fasting labs in 6 weeks to see how your new medication is doing. You may come to our office any time between 7:30AM and 4:30 PM as long as you are fasting. 3) START TOPROL XL 25 mg daily   COVID SCREENING INFORMATION: You are scheduled for your COVID screening on 09/04/2019 at 10:05AM. [place] [facility] ([facility]) [address] Stay in the RIGHT lane and proceed under the brick awning (NOT the tent) and tell them you are there for pre-procedure testing Do NOT bring any pets with you to the testing site   CATHETERIZATION INSTRUCTIONS: You are scheduled for a Cardiac Catheterization on Friday, November 20 with Dr. Peter Martinique.  1. Please arrive at the [facility] (Main Entrance A) at [facility] [address] [place], [place] [address] at 7:00 AM (This time is two hours before your procedure to ensure your preparation). You are allowed ONE visitor in the waiting room during your procedure. Both you and your visitor must wear masks.   Special note: Every effort is made to have your procedure done on time. Please understand that emergencies sometimes delay scheduled procedures.  2. Diet: Do not eat solid foods after midnight.  The patient may have clear liquids until 5am upon the day of the procedure.  3. Labs: Pre-procedure labs will be drawn TODAY.  4. Medication instructions in preparation for your procedure:  1) MAKE SURE TO TAKE YOUR ASPIRIN 81 mg the morning of your cath.  2) Take your other medications as directed with sips of water  5. Plan for one night stay--bring personal belongings. 6. Bring a current list of your medications and current insurance cards. 7. You MUST have a responsible person to drive you home. 8. Someone MUST be with you the first 24 hours after you arrive home or your discharge will be delayed. 9. Please wear clothes that  are easy to get on and off and wear slip-on shoes.   FOLLOW UP APPOINTMENT: You are scheduled for a follow-up appointment on 09/23/2019 at 9:00AM with Dr. Theodosia Blender assistant, Ermalinda Barrios.

## 2019-09-03 NOTE — H&P (View-Only) (Signed)
Cardiology Office Note    Date:  09/03/2019   ID:  Brittany Mathis, DOB [birth year], MRN [id number]  PCP:  Sueanne Margarita, MD  Cardiologist:  Fransico Him, MD   Chief Complaint  Patient presents with  . New Patient (Initial Visit)    coronary artery calcifications    History of Present Illness:  Brittany Mathis is a 73 y.o. female who is being seen today for the evaluation of coronary artery calcifications at the request of Plotnikov, Evie Lacks, MD.  This is a 73yo female with a hx of HTN and HLD who underwent risk factor assessment for CAD with coronary artery calcium screening and was found to have a calcium score of 1431. She is here today for evaluation.  She tells me that she had never had any CP but has a strong family hx of CAD with her father having an MI in his 35's.  Her mom had a CVA.  She has CRF besides her fm hx including HTN and HLD but exercises.without any problems.  A week ago she was moving some heaving things and developed a tightness across her chest with no associated sx of N/diaphoresis or SOB.  This lasted about 30 minutes and resolved on its own.  Since then she has not had any further CP.  She denies any  SOB, DOE, PND, orthopnea, LE edema, dizziness, palpitations or syncope. She is compliant with her meds and is tolerating meds with no SE.    Past Medical History:  Diagnosis Date  . Cataract   . History of shingles   . Hyperlipidemia   . Hypertension   . Nephrolithiasis 1972  . Osteoporosis 05/2017   T score -2.7  . Thyroid disease 2010   thyroiditis    Past Surgical History:  Procedure Laterality Date  . ABDOMINAL HYSTERECTOMY  1983   with BSO  menorrhagia, endometriosis  . cataracts Bilateral 2018  . OOPHORECTOMY     BSO    Current Medications: No outpatient medications have been marked as taking for the 09/03/19 encounter (Office Visit) with Sueanne Margarita, MD.    Allergies:   Influenza vaccines   Social History   Socioeconomic History  .  Marital status: Married    Spouse name: Not on file  . Number of children: Not on file  . Years of education: Not on file  . Highest education level: Not on file  Occupational History  . Not on file  Social Needs  . Financial resource strain: Not hard at all  . Food insecurity    Worry: Never true    Inability: Never true  . Transportation needs    Medical: No    Non-medical: No  Tobacco Use  . Smoking status: Never Smoker  . Smokeless tobacco: Never Used  . Tobacco comment: experimented in college  Substance and Sexual Activity  . Alcohol use: Yes    Alcohol/week: 14.0 standard drinks    Types: 14 Standard drinks or equivalent per week  . Drug use: No  . Sexual activity: Yes    Birth control/protection: Surgical    Comment: HYST-1st intercourse 45 yo-1 partner  Lifestyle  . Physical activity    Days per week: 0 days    Minutes per session: 0 min  . Stress: Not at all  Relationships  . Social connections    Talks on phone: More than three times a week    Gets together: More than three times a week  Attends religious service: 1 to 4 times per year    Active member of club or organization: Yes    Attends meetings of clubs or organizations: Not on file    Relationship status: Married  Other Topics Concern  . Not on file  Social History Narrative  . Not on file     Family History:  The patient's family history includes Colon cancer in her maternal aunt; Glaucoma in her father; Hyperlipidemia in her father and mother; Hypertension in her father and mother; Kidney disease in her father; Stroke in her mother.   ROS:   Please see the history of present illness.    ROS All other systems reviewed and are negative.  No flowsheet data found.     PHYSICAL EXAM:   VS:  There were no vitals taken for this visit.   GEN: Well nourished, well developed, in no acute distress  HEENT: normal  Neck: no JVD, carotid bruits, or masses Cardiac: RRR; no murmurs, rubs, or  gallops,no edema.  Intact distal pulses bilaterally.  Respiratory:  clear to auscultation bilaterally, normal work of breathing GI: soft, nontender, nondistended, + BS MS: no deformity or atrophy  Skin: warm and dry, no rash Neuro:  Alert and Oriented x 3, Strength and sensation are intact Psych: euthymic mood, full affect  Wt Readings from Last 3 Encounters:  05/08/19 99 lb (44.9 kg)  05/06/19 99 lb (44.9 kg)  08/29/18 103 lb (46.7 kg)      Studies/Labs Reviewed:   EKG:  EKG is ordered today.  The ekg ordered today demonstrates NSR with inferior infarct and no acute ST changes, rSR; in V1  Recent Labs: 05/06/2019: ALT 16; Hemoglobin 13.3; Platelets 226.0; TSH 2.02 05/27/2019: BUN 13; Creatinine, Ser 0.63; Potassium 5.0; Sodium 131   Lipid Panel    Component Value Date/Time   CHOL 208 (H) 05/06/2019 1121   TRIG 66.0 05/06/2019 1121   HDL 101.10 05/06/2019 1121   CHOLHDL 2 05/06/2019 1121   VLDL 13.2 05/06/2019 1121   LDLCALC 94 05/06/2019 1121   LDLDIRECT 109.2 02/05/2012 0905    Additional studies/ records that were reviewed today include:    Coronary calcium score CT  ASSESSMENT:    1. Agatston coronary artery calcium score greater than 400   2. Essential hypertension   3. Hyperlipidemia LDL goal <70      PLAN:  In order of problems listed above:  1.  Coronary artery calcifications -she had a CT calcium score done for risk assessment and was found to have a calcium score of 1431. -denies any chest pain or SOB -she needs aggressive risk factor modification -her EKG shows NSR with inferior infarct with no acute ST changes -I am concerned about the episode of CP she had a week ago for over 30 min.  Although there was no radiation of her pain and no associated sx, I am concerned about the inferior Q waves on EKG and very high calcium score on CT -she has not had any further episodes of CP -recommend left heart cath to define coronary anatomy -Cardiac  catheterization was discussed with the patient fully. The patient understands that risks include but are not limited to stroke (1 in 1000), death (1 in 1000), kidney failure [usually temporary] (1 in 500), bleeding (1 in 200), allergic reaction [possibly serious] (1 in 200).  The patient understands and is willing to proceed.   -continue ASA 81mg  daily -continue statin -add Toprol XL 25mg  daily -she will  undergo cath with Dr. [place] on Friday -I will check a stat trop today given her CP last week. If she had a cardiac event last week would expect this to still be mildly elevated  2.  HTN -BP controlled -continue Diovan 40mg  daily  3.  HLD -LDL goal < 70 -her last LDL in the summer was 94 -change simvastatin to Lipitor 40mg  daily and repeat FLP and ALT in 6 weeks  Followup with me in 1 year    Medication Adjustments/Labs and Tests Ordered: Current medicines are reviewed at length with the patient today.  Concerns regarding medicines are outlined above.  Medication changes, Labs and Tests ordered today are listed in the Patient Instructions below.  There are no Patient Instructions on file for this visit.   Signed, , MD  09/03/2019 9:36 AM    [facility] [address] [place], [place], [place]  [place] Phone: [phone]; Fax: [phone]

## 2019-09-03 NOTE — Progress Notes (Signed)
Cardiology Office Note    Date:  09/03/2019   ID:  Brittany Mathis, DOB [birth year], MRN [id number]  PCP:  Sueanne Margarita, MD  Cardiologist:  Fransico Him, MD   Chief Complaint  Patient presents with  . New Patient (Initial Visit)    coronary artery calcifications    History of Present Illness:  Brittany Mathis is a 73 y.o. female who is being seen today for the evaluation of coronary artery calcifications at the request of Plotnikov, Evie Lacks, MD.  This is a 73yo female with a hx of HTN and HLD who underwent risk factor assessment for CAD with coronary artery calcium screening and was found to have a calcium score of 1431. She is here today for evaluation.  She tells me that she had never had any CP but has a strong family hx of CAD with her father having an MI in his 35's.  Her mom had a CVA.  She has CRF besides her fm hx including HTN and HLD but exercises.without any problems.  A week ago she was moving some heaving things and developed a tightness across her chest with no associated sx of N/diaphoresis or SOB.  This lasted about 30 minutes and resolved on its own.  Since then she has not had any further CP.  She denies any  SOB, DOE, PND, orthopnea, LE edema, dizziness, palpitations or syncope. She is compliant with her meds and is tolerating meds with no SE.    Past Medical History:  Diagnosis Date  . Cataract   . History of shingles   . Hyperlipidemia   . Hypertension   . Nephrolithiasis 1972  . Osteoporosis 05/2017   T score -2.7  . Thyroid disease 2010   thyroiditis    Past Surgical History:  Procedure Laterality Date  . ABDOMINAL HYSTERECTOMY  1983   with BSO  menorrhagia, endometriosis  . cataracts Bilateral 2018  . OOPHORECTOMY     BSO    Current Medications: No outpatient medications have been marked as taking for the 09/03/19 encounter (Office Visit) with Sueanne Margarita, MD.    Allergies:   Influenza vaccines   Social History   Socioeconomic History  .  Marital status: Married    Spouse name: Not on file  . Number of children: Not on file  . Years of education: Not on file  . Highest education level: Not on file  Occupational History  . Not on file  Social Needs  . Financial resource strain: Not hard at all  . Food insecurity    Worry: Never true    Inability: Never true  . Transportation needs    Medical: No    Non-medical: No  Tobacco Use  . Smoking status: Never Smoker  . Smokeless tobacco: Never Used  . Tobacco comment: experimented in college  Substance and Sexual Activity  . Alcohol use: Yes    Alcohol/week: 14.0 standard drinks    Types: 14 Standard drinks or equivalent per week  . Drug use: No  . Sexual activity: Yes    Birth control/protection: Surgical    Comment: HYST-1st intercourse 45 yo-1 partner  Lifestyle  . Physical activity    Days per week: 0 days    Minutes per session: 0 min  . Stress: Not at all  Relationships  . Social connections    Talks on phone: More than three times a week    Gets together: More than three times a week  Attends religious service: 1 to 4 times per year    Active member of club or organization: Yes    Attends meetings of clubs or organizations: Not on file    Relationship status: Married  Other Topics Concern  . Not on file  Social History Narrative  . Not on file     Family History:  The patient's family history includes Colon cancer in her maternal aunt; Glaucoma in her father; Hyperlipidemia in her father and mother; Hypertension in her father and mother; Kidney disease in her father; Stroke in her mother.   ROS:   Please see the history of present illness.    ROS All other systems reviewed and are negative.  No flowsheet data found.     PHYSICAL EXAM:   VS:  There were no vitals taken for this visit.   GEN: Well nourished, well developed, in no acute distress  HEENT: normal  Neck: no JVD, carotid bruits, or masses Cardiac: RRR; no murmurs, rubs, or  gallops,no edema.  Intact distal pulses bilaterally.  Respiratory:  clear to auscultation bilaterally, normal work of breathing GI: soft, nontender, nondistended, + BS MS: no deformity or atrophy  Skin: warm and dry, no rash Neuro:  Alert and Oriented x 3, Strength and sensation are intact Psych: euthymic mood, full affect  Wt Readings from Last 3 Encounters:  05/08/19 99 lb (44.9 kg)  05/06/19 99 lb (44.9 kg)  08/29/18 103 lb (46.7 kg)      Studies/Labs Reviewed:   EKG:  EKG is ordered today.  The ekg ordered today demonstrates NSR with inferior infarct and no acute ST changes, rSR; in V1  Recent Labs: 05/06/2019: ALT 16; Hemoglobin 13.3; Platelets 226.0; TSH 2.02 05/27/2019: BUN 13; Creatinine, Ser 0.63; Potassium 5.0; Sodium 131   Lipid Panel    Component Value Date/Time   CHOL 208 (H) 05/06/2019 1121   TRIG 66.0 05/06/2019 1121   HDL 101.10 05/06/2019 1121   CHOLHDL 2 05/06/2019 1121   VLDL 13.2 05/06/2019 1121   LDLCALC 94 05/06/2019 1121   LDLDIRECT 109.2 02/05/2012 0905    Additional studies/ records that were reviewed today include:    Coronary calcium score CT  ASSESSMENT:    1. Agatston coronary artery calcium score greater than 400   2. Essential hypertension   3. Hyperlipidemia LDL goal <70      PLAN:  In order of problems listed above:  1.  Coronary artery calcifications -she had a CT calcium score done for risk assessment and was found to have a calcium score of 1431. -denies any chest pain or SOB -she needs aggressive risk factor modification -her EKG shows NSR with inferior infarct with no acute ST changes -I am concerned about the episode of CP she had a week ago for over 30 min.  Although there was no radiation of her pain and no associated sx, I am concerned about the inferior Q waves on EKG and very high calcium score on CT -she has not had any further episodes of CP -recommend left heart cath to define coronary anatomy -Cardiac  catheterization was discussed with the patient fully. The patient understands that risks include but are not limited to stroke (1 in 1000), death (1 in 1000), kidney failure [usually temporary] (1 in 500), bleeding (1 in 200), allergic reaction [possibly serious] (1 in 200).  The patient understands and is willing to proceed.   -continue ASA 81mg daily -continue statin -add Toprol XL 25mg daily -she will   undergo cath with Dr. [place] on Friday -I will check a stat trop today given her CP last week. If she had a cardiac event last week would expect this to still be mildly elevated  2.  HTN -BP controlled -continue Diovan 40mg  daily  3.  HLD -LDL goal < 70 -her last LDL in the summer was 94 -change simvastatin to Lipitor 40mg  daily and repeat FLP and ALT in 6 weeks  Followup with me in 1 year    Medication Adjustments/Labs and Tests Ordered: Current medicines are reviewed at length with the patient today.  Concerns regarding medicines are outlined above.  Medication changes, Labs and Tests ordered today are listed in the Patient Instructions below.  There are no Patient Instructions on file for this visit.   Signed, , MD  09/03/2019 9:36 AM    [facility] [address] [place], [place], [place]  [place] Phone: [phone]; Fax: [phone]

## 2019-09-04 ENCOUNTER — Other Ambulatory Visit (HOSPITAL_COMMUNITY)
Admission: RE | Admit: 2019-09-04 | Discharge: 2019-09-04 | Disposition: A | Discharge status: Home / Self Care | Payer: Medicare Other | Source: Ambulatory Visit | Attending: Cardiology | Admitting: Cardiology

## 2019-09-04 DIAGNOSIS — Z20828 Contact with and (suspected) exposure to other viral communicable diseases: Secondary | ICD-10-CM | POA: Diagnosis not present

## 2019-09-04 DIAGNOSIS — Z01812 Encounter for preprocedural laboratory examination: Secondary | ICD-10-CM | POA: Insufficient documentation

## 2019-09-04 LAB — SARS CORONAVIRUS 2 (TAT 6-24 HRS): SARS Coronavirus 2: NEGATIVE

## 2019-09-05 ENCOUNTER — Encounter (HOSPITAL_COMMUNITY)
Admission: RE | Disposition: A | Discharge status: Home / Self Care | Payer: Self-pay | Source: Home / Self Care | Attending: Cardiology

## 2019-09-05 ENCOUNTER — Other Ambulatory Visit: Payer: Self-pay

## 2019-09-05 ENCOUNTER — Ambulatory Visit (HOSPITAL_COMMUNITY)
Admission: RE | Admit: 2019-09-05 | Discharge: 2019-09-05 | Disposition: A | Discharge status: Home / Self Care | Payer: Medicare Other | Attending: Cardiology | Admitting: Cardiology

## 2019-09-05 ENCOUNTER — Encounter (HOSPITAL_COMMUNITY): Payer: Self-pay | Admitting: Cardiology

## 2019-09-05 DIAGNOSIS — I25110 Atherosclerotic heart disease of native coronary artery with unstable angina pectoris: Secondary | ICD-10-CM | POA: Diagnosis not present

## 2019-09-05 DIAGNOSIS — E079 Disorder of thyroid, unspecified: Secondary | ICD-10-CM | POA: Insufficient documentation

## 2019-09-05 DIAGNOSIS — Z79899 Other long term (current) drug therapy: Secondary | ICD-10-CM | POA: Diagnosis not present

## 2019-09-05 DIAGNOSIS — I200 Unstable angina: Secondary | ICD-10-CM | POA: Diagnosis present

## 2019-09-05 DIAGNOSIS — Z7982 Long term (current) use of aspirin: Secondary | ICD-10-CM | POA: Insufficient documentation

## 2019-09-05 DIAGNOSIS — I10 Essential (primary) hypertension: Secondary | ICD-10-CM | POA: Insufficient documentation

## 2019-09-05 DIAGNOSIS — E785 Hyperlipidemia, unspecified: Secondary | ICD-10-CM | POA: Diagnosis not present

## 2019-09-05 DIAGNOSIS — R931 Abnormal findings on diagnostic imaging of heart and coronary circulation: Secondary | ICD-10-CM | POA: Diagnosis present

## 2019-09-05 HISTORY — PX: LEFT HEART CATH AND CORONARY ANGIOGRAPHY: CATH118249

## 2019-09-05 SURGERY — LEFT HEART CATH AND CORONARY ANGIOGRAPHY
Anesthesia: LOCAL

## 2019-09-05 MED ORDER — HEPARIN (PORCINE) IN NACL 1000-0.9 UT/500ML-% IV SOLN
INTRAVENOUS | Status: DC | PRN
  Administered 2019-09-05 (×2): 500 mL

## 2019-09-05 MED ORDER — HEPARIN SODIUM (PORCINE) 1000 UNIT/ML IJ SOLN
INTRAMUSCULAR | Status: DC | PRN
  Administered 2019-09-05: 3000 [IU] via INTRAVENOUS

## 2019-09-05 MED ORDER — SODIUM CHLORIDE 0.9 % WEIGHT BASED INFUSION
1.0000 mL/kg/h | INTRAVENOUS | Status: DC
  Administered 2019-09-05: 09:00:00 1 mL/kg/h via INTRAVENOUS

## 2019-09-05 MED ORDER — LIDOCAINE HCL (PF) 1 % IJ SOLN
INTRAMUSCULAR | Status: AC
  Filled 2019-09-05: qty 30

## 2019-09-05 MED ORDER — SODIUM CHLORIDE 0.9 % WEIGHT BASED INFUSION
3.0000 mL/kg/h | INTRAVENOUS | Status: AC
  Administered 2019-09-05: 3 mL/kg/h via INTRAVENOUS

## 2019-09-05 MED ORDER — HEPARIN (PORCINE) IN NACL 1000-0.9 UT/500ML-% IV SOLN
INTRAVENOUS | Status: AC
  Filled 2019-09-05: qty 1000

## 2019-09-05 MED ORDER — SODIUM CHLORIDE 0.9% FLUSH: 3.0000 mL | Freq: Two times a day (BID) | INTRAVENOUS | Status: DC

## 2019-09-05 MED ORDER — SODIUM CHLORIDE 0.9% FLUSH: 3.0000 mL | INTRAVENOUS | Status: DC | PRN

## 2019-09-05 MED ORDER — FENTANYL CITRATE (PF) 100 MCG/2ML IJ SOLN
INTRAMUSCULAR | Status: DC | PRN
  Administered 2019-09-05: 25 ug via INTRAVENOUS

## 2019-09-05 MED ORDER — FENTANYL CITRATE (PF) 100 MCG/2ML IJ SOLN
INTRAMUSCULAR | Status: AC
  Filled 2019-09-05: qty 2

## 2019-09-05 MED ORDER — VERAPAMIL HCL 2.5 MG/ML IV SOLN
INTRAVENOUS | Status: DC | PRN
  Administered 2019-09-05: 10 mL via INTRA_ARTERIAL

## 2019-09-05 MED ORDER — MIDAZOLAM HCL 2 MG/2ML IJ SOLN
INTRAMUSCULAR | Status: DC | PRN
  Administered 2019-09-05: 1 mg via INTRAVENOUS

## 2019-09-05 MED ORDER — MIDAZOLAM HCL 2 MG/2ML IJ SOLN
INTRAMUSCULAR | Status: AC
  Filled 2019-09-05: qty 2

## 2019-09-05 MED ORDER — LIDOCAINE HCL (PF) 1 % IJ SOLN
INTRAMUSCULAR | Status: DC | PRN
  Administered 2019-09-05: 2 mL via INTRADERMAL

## 2019-09-05 MED ORDER — VERAPAMIL HCL 2.5 MG/ML IV SOLN
INTRAVENOUS | Status: AC
  Filled 2019-09-05: qty 2

## 2019-09-05 MED ORDER — ASPIRIN 81 MG PO CHEW: 81.0000 mg | CHEWABLE_TABLET | ORAL | Status: DC

## 2019-09-05 MED ORDER — SODIUM CHLORIDE 0.9 % IV SOLN: 250.0000 mL | INTRAVENOUS | Status: DC | PRN

## 2019-09-05 MED ORDER — IOHEXOL 350 MG/ML SOLN
INTRAVENOUS | Status: DC | PRN
  Administered 2019-09-05: 55 mL via INTRA_ARTERIAL

## 2019-09-05 MED ORDER — HEPARIN SODIUM (PORCINE) 1000 UNIT/ML IJ SOLN
INTRAMUSCULAR | Status: AC
  Filled 2019-09-05: qty 1

## 2019-09-05 SURGICAL SUPPLY — 10 items
CATH 5FR JL3.5 JR4 ANG PIG MP (CATHETERS) ×1 IMPLANT
DEVICE RAD TR BAND REGULAR (VASCULAR PRODUCTS) ×2 IMPLANT
GLIDESHEATH SLEND SS 6F .021 (SHEATH) ×1 IMPLANT
GUIDEWIRE INQWIRE 1.5J.035X260 (WIRE) IMPLANT
INQWIRE 1.5J .035X260CM (WIRE) ×2
KIT HEART LEFT (KITS) ×2 IMPLANT
PACK CARDIAC CATHETERIZATION (CUSTOM PROCEDURE TRAY) ×2 IMPLANT
SHEATH PROBE COVER 6X72 (BAG) ×1 IMPLANT
TRANSDUCER W/STOPCOCK (MISCELLANEOUS) ×2 IMPLANT
TUBING CIL FLEX 10 FLL-RA (TUBING) ×2 IMPLANT

## 2019-09-05 NOTE — Interval H&P Note (Signed)
History and Physical Interval Note:  09/05/2019 8:17 AM  Brittany Mathis  has presented today for surgery, with the diagnosis of chest pain, elevated Ca score.  The various methods of treatment have been discussed with the patient and family. After consideration of risks, benefits and other options for treatment, the patient has consented to  Procedure(s): LEFT HEART CATH AND CORONARY ANGIOGRAPHY (N/A) as a surgical intervention.  The patient's history has been reviewed, patient examined, no change in status, stable for surgery.  I have reviewed the patient's chart and labs.  Questions were answered to the patient's satisfaction.   Cath Lab Visit (complete for each Cath Lab visit)  Clinical Evaluation Leading to the Procedure:   ACS: Yes.    Non-ACS:    Anginal Classification: CCS II  Anti-ischemic medical therapy: No Therapy  Non-Invasive Test Results: No non-invasive testing performed  Prior CABG: No previous CABG        [name] [facility] 09/05/2019 8:17 AM

## 2019-09-05 NOTE — Discharge Instructions (Signed)
KEEP AFFECTED ARM ELEVATED FOR THE REMAINDER OF THE DAY.  DRINK PLENTY OF FLUIDS FOR THE NEXT 2-3 DAYS.  Radial Site Care  This sheet gives you information about how to care for yourself after your procedure. Your health care provider may also give you more specific instructions. If you have problems or questions, contact your health care provider. What can I expect after the procedure? After the procedure, it is common to have:  Bruising and tenderness at the catheter insertion area. Follow these instructions at home: Medicines  Take over-the-counter and prescription medicines only as told by your health care provider. Insertion site care  Follow instructions from your health care provider about how to take care of your insertion site. Make sure you: ? Wash your hands with soap and water before you change your bandage (dressing). If soap and water are not available, use hand sanitizer. ? Change your dressing as told by your health care provider.  Check your insertion site every day for signs of infection. Check for: ? Redness, swelling, or pain. ? Fluid or blood. ? Pus or a bad smell. ? Warmth.  Do not take baths, swim, or use a hot tub until your health care provider approves.  You may shower 24-48 hours after the procedure. ? Remove the dressing and gently wash the site with plain soap and water. ? Pat the area dry with a clean towel. ? Do not rub the site. That could cause bleeding.  Do not apply powder or lotion to the site. Activity   For 24 hours after the procedure, or as directed by your health care provider: ? Do not flex or bend the affected arm. ? Do not push or pull heavy objects with the affected arm. ? Do not drive yourself home from the hospital or clinic. You may drive 24 hours after the procedure. ? Do not operate machinery or power tools.  Do not lift anything that is heavier than 10 lb for 5 days.  Ask your health care provider when it is okay  to: ? Return to work or school. ? Resume usual physical activities or sports. ? Resume sexual activity. General instructions  If the catheter site starts to bleed, raise your arm and put firm pressure on the site. If the bleeding does not stop, get help right away. This is a medical emergency.  If you went home on the same day as your procedure, a responsible adult should be with you for the first 24 hours after you arrive home.  Keep all follow-up visits as told by your health care provider. This is important. Contact a health care provider if:  You have a fever.  You have redness, swelling, or yellow drainage around your insertion site. Get help right away if:  You have unusual pain at the radial site.  The catheter insertion area swells very fast.  The insertion area is bleeding, and the bleeding does not stop when you hold steady pressure on the area.  Your arm or hand becomes pale, cool, tingly, or numb. These symptoms may represent a serious problem that is an emergency. Do not wait to see if the symptoms will go away. Get medical help right away. Call your local emergency services (911 in the [place] Do not drive yourself to the hospital. Summary  After the procedure, it is common to have bruising and tenderness at the site.  Follow instructions from your health care provider about how to take care of your radial site  wound. Check the wound every day for signs of infection.  Do not lift anything that is heavier than 10 lb for 5 days.   This information is not intended to replace advice given to you by your health care provider. Make sure you discuss any questions you have with your health care provider. Document Released: 11/04/2010 Document Revised: 11/07/2017 Document Reviewed: 11/07/2017 Elsevier Patient Education  2020 [employer].

## 2019-09-17 DIAGNOSIS — Z8249 Family history of ischemic heart disease and other diseases of the circulatory system: Secondary | ICD-10-CM | POA: Insufficient documentation

## 2019-09-17 DIAGNOSIS — I2510 Atherosclerotic heart disease of native coronary artery without angina pectoris: Secondary | ICD-10-CM | POA: Insufficient documentation

## 2019-09-17 NOTE — Progress Notes (Signed)
Cardiology Office Note    Date:  09/23/2019   ID:  Brittany Fusinn H Vanacker, DOB [birth year], MRN [id number]  PCP:  Tresa GarterPlotnikov, Aleksei V, MD  Cardiologist: Armanda Magicraci Turner, MD EPS: None  Chief Complaint  Patient presents with   Follow-up    History of Present Illness:  Brittany Mathis is a 73 y.o. female with history of hypertension hyperlipidemia and strong family history of CAD with father having MIs in his 2950s and mom having CVA who was found to have a coronary artery calcium score of 1431.  She had one episode of chest pain while moving heavy things described as a tightness across her chest that lasted 30 minutes.  She was started on Lipitor 40 mg daily because of an LDL of 94   Cardiac catheterization was performed 1120/20 and she had severe two-vessel obstructive CAD with 70% LAD proximally with a very large diagonal branch.  Segmental 90% mid LAD heavily calcified and tortuous, 90% ostial first diagonal and 70% PLA branch of the RCA.  She had normal LV function.  He recommended aggressive medical therapy.  Given complexity of her disease she was not a candidate for PCI and if she has refractory angina recommend referral for CABG.  Patient comes in for f/u. She says she was shocked by cath results. She lives a very healthy lifestyle. Denies any further chest tightness. Has been up and down stairs, deep cleaning and decorating without symptoms.    Past Medical History:  Diagnosis Date   Cataract    History of shingles    Hyperlipidemia    Hypertension    Nephrolithiasis 1972   Osteoporosis 05/2017   T score -2.7   Thyroid disease 2010   thyroiditis    Past Surgical History:  Procedure Laterality Date   ABDOMINAL HYSTERECTOMY  1983   with BSO  menorrhagia, endometriosis   cataracts Bilateral 2018   LEFT HEART CATH AND CORONARY ANGIOGRAPHY N/A 09/05/2019   Procedure: LEFT HEART CATH AND CORONARY ANGIOGRAPHY;  Surgeon: SwazilandJordan, Peter M, MD;  [facility];   Service: Cardiovascular;  Laterality: N/A;   OOPHORECTOMY     BSO    Current Medications: Current Meds  Medication Sig   alendronate (FOSAMAX) 70 MG tablet Take 1 tablet (70 mg total) by mouth every 7 (seven) days. Take with a full glass of water on an empty stomach. (Patient taking differently: Take 70 mg by mouth every Tuesday. Take with a full glass of water on an empty stomach.)   aspirin EC 81 MG tablet Take 81 mg by mouth daily with lunch.    atorvastatin (LIPITOR) 40 MG tablet Take 1 tablet (40 mg total) by mouth daily. (Patient taking differently: Take 40 mg by mouth at bedtime. )   Cholecalciferol (VITAMIN D) 50 MCG (2000 UT) tablet Take 2,000 Units by mouth daily with lunch.   DIOVAN 160 MG tablet Take 0.5 tablets (80 mg total) by mouth daily. (Patient taking differently: Take 160 mg by mouth every other day. )   estradiol (CLIMARA - DOSED IN MG/24 HR) 0.0375 mg/24hr patch Place 0.0375 mg onto the skin 5 days.    metoprolol succinate (TOPROL XL) 25 MG 24 hr tablet Take 1 tablet (25 mg total) by mouth daily. (Patient taking differently: Take 25 mg by mouth at bedtime. )   Propylene Glycol (SYSTANE COMPLETE) 0.6 % SOLN Place 1 drop into both eyes daily.     Allergies:   Influenza vaccines   Social  History   Socioeconomic History   Marital status: Married    Spouse name: Not on file   Number of children: Not on file   Years of education: Not on file   Highest education level: Not on file  Occupational History   Not on file  Social Needs   Financial resource strain: Not hard at all   Food insecurity    Worry: Never true    Inability: Never true   Transportation needs    Medical: No    Non-medical: No  Tobacco Use   Smoking status: Never Smoker   Smokeless tobacco: Never Used   Tobacco comment: experimented in college  Substance and Sexual Activity   Alcohol use: Yes    Alcohol/week: 14.0 standard drinks    Types: 14 Standard drinks or equivalent  per week   Drug use: No   Sexual activity: Yes    Birth control/protection: Surgical    Comment: HYST-1st intercourse 21 yo-1 partner  Lifestyle   Physical activity    Days per week: 0 days    Minutes per session: 0 min   Stress: Not at all  Relationships   Social connections    Talks on phone: More than three times a week    Gets together: More than three times a week    Attends religious service: 1 to 4 times per year    Active member of club or organization: Yes    Attends meetings of clubs or organizations: Not on file    Relationship status: Married  Other Topics Concern   Not on file  Social History Narrative   Not on file     Family History:  The patient's   family history includes Colon cancer in her maternal aunt; Glaucoma in her father; Hyperlipidemia in her father and mother; Hypertension in her father and mother; Kidney disease in her father; Stroke in her mother.   ROS:   Please see the history of present illness.    ROS All other systems reviewed and are negative.   PHYSICAL EXAM:   VS:  BP (!) 116/52    Pulse 64    Ht 5\' 4"  (1.626 m)    Wt 99 lb 6.4 oz (45.1 kg)    SpO2 93%    BMI 17.06 kg/m   Physical Exam  GEN: Thin, in no acute distress  Neck: no JVD, carotid bruits, or masses Cardiac:RRR; no murmurs, rubs, or gallops  Respiratory:  clear to auscultation bilaterally, normal work of breathing GI: soft, nontender, nondistended, + BS Ext: without cyanosis, clubbing, or edema, Good distal pulses bilaterally Neuro:  Alert and Oriented x 3 Psych: euthymic mood, full affect  Wt Readings from Last 3 Encounters:  09/23/19 99 lb 6.4 oz (45.1 kg)  09/05/19 100 lb (45.4 kg)  09/03/19 100 lb 12.8 oz (45.7 kg)      Studies/Labs Reviewed:   EKG:  EKG is ordered today.    Recent Labs: 05/06/2019: ALT 16; TSH 2.02 09/03/2019: BUN 15; Creatinine, Ser 0.57; Hemoglobin 12.5; Platelets 222; Potassium 5.0; Sodium 133   Lipid Panel    Component Value  Date/Time   CHOL 208 (H) 05/06/2019 1121   TRIG 66.0 05/06/2019 1121   HDL 101.10 05/06/2019 1121   CHOLHDL 2 05/06/2019 1121   VLDL 13.2 05/06/2019 1121   LDLCALC 94 05/06/2019 1121   LDLDIRECT 109.2 02/05/2012 0905    Additional studies/ records that were reviewed today include:  Cardiac catheterization 09/05/2019   RPAV  lesion is 70% stenosed.  Prox RCA to Mid RCA lesion is 25% stenosed.  Prox LAD lesion is 70% stenosed.  Mid LAD lesion is 90% stenosed.  1st Diag lesion is 90% stenosed.  The left ventricular systolic function is normal.  LV end diastolic pressure is normal.  The left ventricular ejection fraction is 55-65% by visual estimate.   1. Severe 2 vessel obstructive CAD    - 70% LAD proximally at the bifurcation with a very large diagonal branch. Segmental 90% mid LAD stenosis. This vessel is heavily calcified and tortuous    - 90% ostial large first diagonal    - 70% PL branch of the RCA 2. Normal LV function 3. Normal LVEDP   Plan: recommend aggressive medical therapy. Given complexity of her disease she is not a candidate for PCI. If she has refractory angina I would recommend referral for CABG.      ASSESSMENT:    1. Essential hypertension   2. Coronary artery disease involving native coronary artery of native heart without angina pectoris   3. Dyslipidemia   4. Family history of early CAD      PLAN:  In order of problems listed above:   CAD with severe two-vessel obstructive disease on cardiac cath 09/05/2019 as described above.  Medical therapy recommended.  Given complexity of her disease she is not a candidate for PCI.  If she has refractory angina he did recommend referral for CABG.  On Toprol and Diovan, aspirin and Lipitor.  No further chest pain.  We will give her sublingual nitroglycerin as needed.  She is to call us if she has any further chest pain.  Follow-up with Dr. Mayford Knife in 2 months.  Essential hypertension  well-controlled.  Hyperlipidemia was just started on Lipitor scheduled for labs in January.  Family history of early CAD   Medication Adjustments/Labs and Tests Ordered: Current medicines are reviewed at length with the patient today.  Concerns regarding medicines are outlined above.  Medication changes, Labs and Tests ordered today are listed in the Patient Instructions below. Patient Instructions  Medication Instructions:  Your physician has recommended you make the following change in your medication:   START: Sublingual Nitroglycerin 0.4 mg tablet AS NEEDED for chest pain. Use as directed   If you need a refill on your cardiac medications before your next appointment, please call your pharmacy.   Lab work: None Ordered  If you have labs (blood work) drawn today and your tests are completely normal, you will receive your results only by:  MyChart Message (if you have MyChart) OR  A paper copy in the mail If you have any lab test that is abnormal or we need to change your treatment, we will call you to review the results.  Testing/Procedures: None ordered  Follow-Up:  Follow up with Dr. Mayford Knife on 11/26/18 at 9:20 AM  Any Other Special Instructions Will Be Listed Below (If Applicable).  Let us know if your chest pain returns     Signed, Jacolyn Reedy, PA-C  09/23/2019 9:46 AM    [facility] [address] [place], [place], [place]  [address] Phone: [phone]; Fax: [phone]

## 2019-09-23 ENCOUNTER — Encounter: Payer: Self-pay | Admitting: Physician Assistant

## 2019-09-23 ENCOUNTER — Ambulatory Visit: Payer: Medicare Other | Admitting: Physician Assistant

## 2019-09-23 ENCOUNTER — Other Ambulatory Visit: Payer: Self-pay

## 2019-09-23 VITALS — BP 116/52 | HR 64 | Ht 64.0 in | Wt 99.4 lb

## 2019-09-23 DIAGNOSIS — I2510 Atherosclerotic heart disease of native coronary artery without angina pectoris: Secondary | ICD-10-CM | POA: Diagnosis not present

## 2019-09-23 DIAGNOSIS — Z8249 Family history of ischemic heart disease and other diseases of the circulatory system: Secondary | ICD-10-CM

## 2019-09-23 DIAGNOSIS — I10 Essential (primary) hypertension: Secondary | ICD-10-CM

## 2019-09-23 DIAGNOSIS — E785 Hyperlipidemia, unspecified: Secondary | ICD-10-CM | POA: Diagnosis not present

## 2019-09-23 MED ORDER — NITROGLYCERIN 0.4 MG SL SUBL: 0.4000 mg | SUBLINGUAL_TABLET | SUBLINGUAL | 3 refills | Status: DC | PRN

## 2019-09-23 NOTE — Patient Instructions (Signed)
Medication Instructions:  Your physician has recommended you make the following change in your medication:   START: Sublingual Nitroglycerin 0.4 mg tablet AS NEEDED for chest pain. Use as directed   If you need a refill on your cardiac medications before your next appointment, please call your pharmacy.   Lab work: None Ordered  If you have labs (blood work) drawn today and your tests are completely normal, you will receive your results only by: [name] MyChart Message (if you have MyChart) OR . A paper copy in the mail If you have any lab test that is abnormal or we need to change your treatment, we will call you to review the results.  Testing/Procedures: None ordered  Follow-Up: . Follow up with Dr. Radford Pax on 11/26/18 at 9:20 AM  Any Other Special Instructions Will Be Listed Below (If Applicable).  Let us know if your chest pain returns

## 2019-10-22 ENCOUNTER — Other Ambulatory Visit: Payer: Self-pay

## 2019-10-22 ENCOUNTER — Other Ambulatory Visit: Payer: Medicare PPO | Admitting: *Deleted

## 2019-10-22 DIAGNOSIS — E785 Hyperlipidemia, unspecified: Secondary | ICD-10-CM

## 2019-10-22 DIAGNOSIS — R931 Abnormal findings on diagnostic imaging of heart and coronary circulation: Secondary | ICD-10-CM | POA: Diagnosis not present

## 2019-10-22 LAB — LIPID PANEL
Chol/HDL Ratio: 1.8 ratio (ref 0.0–4.4)
Cholesterol, Total: 169 mg/dL (ref 100–199)
HDL: 95 mg/dL (ref >39)
LDL Chol Calc (NIH): 65 mg/dL (ref 0–99)
Triglycerides: 43 mg/dL (ref 0–149)
VLDL Cholesterol Cal: 9 mg/dL (ref 5–40)

## 2019-10-22 LAB — ALT: ALT: 88 IU/L — ABNORMAL HIGH (ref 0–32)

## 2019-10-24 ENCOUNTER — Telehealth: Payer: Self-pay

## 2019-10-24 DIAGNOSIS — E785 Hyperlipidemia, unspecified: Secondary | ICD-10-CM

## 2019-10-24 NOTE — Telephone Encounter (Signed)
-----   Message from Quintella Reichert, MD sent at 10/23/2019  8:55 AM EST ----- LDL at goal but ALT bumped significantly after starting statin.  Hold atorvastatin for now and refer  to lipid clinic for further recommendations.

## 2019-10-24 NOTE — Telephone Encounter (Signed)
Spoke with patient and scheduled repeat labs.

## 2019-10-25 ENCOUNTER — Ambulatory Visit: Payer: Medicare Other | Attending: Internal Medicine

## 2019-10-25 DIAGNOSIS — Z23 Encounter for immunization: Secondary | ICD-10-CM

## 2019-10-25 NOTE — Progress Notes (Signed)
   Covid-19 Vaccination Clinic  Name:  Brittany Mathis    MRN: [id number] DOB: 1946/02/16  10/25/2019  [name] was observed post Covid-19 immunization for 30 minutes based on pre-vaccination screening without incidence. She was provided with Vaccine Information Sheet and instruction to access the V-Safe system.   [name] was instructed to call 911 with any severe reactions post vaccine: [name] Difficulty breathing  . Swelling of your face and throat  . A fast heartbeat  . A bad rash all over your body  . Dizziness and weakness    Immunizations Administered    Name Date Dose VIS Date Route   Pfizer COVID-19 Vaccine 10/25/2019 12:20 PM 0.3 mL 09/26/2019 Intramuscular   Manufacturer: [employer], [employer]   Lot: [id number]   NDC: [address]-0

## 2019-11-07 ENCOUNTER — Other Ambulatory Visit: Payer: Self-pay

## 2019-11-07 ENCOUNTER — Telehealth: Payer: Self-pay

## 2019-11-07 ENCOUNTER — Other Ambulatory Visit: Payer: Medicare PPO | Admitting: *Deleted

## 2019-11-07 DIAGNOSIS — E785 Hyperlipidemia, unspecified: Secondary | ICD-10-CM | POA: Diagnosis not present

## 2019-11-07 LAB — HEPATIC FUNCTION PANEL
ALT: 17 IU/L (ref 0–32)
AST: 25 IU/L (ref 0–40)
Albumin: 4.4 g/dL (ref 3.7–4.7)
Alkaline Phosphatase: 78 IU/L (ref 39–117)
Bilirubin Total: 0.4 mg/dL (ref 0.0–1.2)
Bilirubin, Direct: 0.13 mg/dL (ref 0.00–0.40)
Total Protein: 6.7 g/dL (ref 6.0–8.5)

## 2019-11-07 NOTE — Telephone Encounter (Signed)
Pt returned call for test results. Please call pt.

## 2019-11-10 ENCOUNTER — Encounter: Payer: Self-pay | Admitting: Internal Medicine

## 2019-11-10 ENCOUNTER — Ambulatory Visit (INDEPENDENT_AMBULATORY_CARE_PROVIDER_SITE_OTHER): Payer: Medicare PPO | Admitting: Internal Medicine

## 2019-11-10 ENCOUNTER — Other Ambulatory Visit: Payer: Self-pay

## 2019-11-10 ENCOUNTER — Telehealth: Payer: Self-pay

## 2019-11-10 VITALS — BP 132/82 | HR 61 | Temp 98.0°F | Ht 64.0 in | Wt 98.0 lb

## 2019-11-10 DIAGNOSIS — Z0000 Encounter for general adult medical examination without abnormal findings: Secondary | ICD-10-CM | POA: Diagnosis not present

## 2019-11-10 DIAGNOSIS — R0989 Other specified symptoms and signs involving the circulatory and respiratory systems: Secondary | ICD-10-CM | POA: Diagnosis not present

## 2019-11-10 DIAGNOSIS — E785 Hyperlipidemia, unspecified: Secondary | ICD-10-CM | POA: Diagnosis not present

## 2019-11-10 DIAGNOSIS — I10 Essential (primary) hypertension: Secondary | ICD-10-CM

## 2019-11-10 DIAGNOSIS — M818 Other osteoporosis without current pathological fracture: Secondary | ICD-10-CM

## 2019-11-10 DIAGNOSIS — I2510 Atherosclerotic heart disease of native coronary artery without angina pectoris: Secondary | ICD-10-CM | POA: Diagnosis not present

## 2019-11-10 MED ORDER — ROSUVASTATIN CALCIUM 20 MG PO TABS: 20.0000 mg | ORAL_TABLET | Freq: Every day | ORAL | 3 refills | Status: DC

## 2019-11-10 NOTE — Assessment & Plan Note (Signed)
We discussed age appropriate health related issues, including available/recomended screening tests and vaccinations. We discussed a need for adhering to healthy diet and exercise. Labs were ordered to be later reviewed . All questions were answered.   

## 2019-11-10 NOTE — Assessment & Plan Note (Signed)
Labs US 

## 2019-11-10 NOTE — Assessment & Plan Note (Signed)
Valsartan, Toprol 

## 2019-11-10 NOTE — Progress Notes (Signed)
Subjective:  Patient ID: Brittany Mathis, female    DOB: Mar 13, 1946  Age: 74 y.o. MRN: [id number]  CC: No chief complaint on file.   HPI Brittany Mathis presents for a well exam F/u CAD, dyslipidemia, elev ALT - recent  Outpatient Medications Prior to Visit  Medication Sig Dispense Refill  . alendronate (FOSAMAX) 70 MG tablet Take 1 tablet (70 mg total) by mouth every 7 (seven) days. Take with a full glass of water on an empty stomach. (Patient taking differently: Take 70 mg by mouth every Tuesday. Take with a full glass of water on an empty stomach.) 12 tablet 4  . aspirin EC 81 MG tablet Take 81 mg by mouth daily with lunch.     . Cholecalciferol (VITAMIN D) 50 MCG (2000 UT) tablet Take 2,000 Units by mouth daily with lunch.    . DIOVAN 160 MG tablet Take 0.5 tablets (80 mg total) by mouth daily. (Patient taking differently: Take 160 mg by mouth every other day. ) 90 tablet 3  . estradiol (CLIMARA - DOSED IN MG/24 HR) 0.0375 mg/24hr patch Place 0.0375 mg onto the skin 5 days.     . metoprolol succinate (TOPROL XL) 25 MG 24 hr tablet Take 1 tablet (25 mg total) by mouth daily. (Patient taking differently: Take 25 mg by mouth at bedtime. ) 30 tablet 11  . nitroGLYCERIN (NITROSTAT) 0.4 MG SL tablet Place 1 tablet (0.4 mg total) under the tongue every 5 (five) minutes as needed for chest pain. 25 tablet 3  . Propylene Glycol (SYSTANE COMPLETE) 0.6 % SOLN Place 1 drop into both eyes daily.     No facility-administered medications prior to visit.    ROS: Review of Systems  Constitutional: Negative for activity change, appetite change, chills, fatigue and unexpected weight change.  HENT: Negative for congestion, mouth sores and sinus pressure.   Eyes: Negative for visual disturbance.  Respiratory: Negative for cough and chest tightness.   Gastrointestinal: Negative for abdominal pain and nausea.  Genitourinary: Negative for difficulty urinating, frequency and vaginal pain.  Musculoskeletal:  Negative for back pain and gait problem.  Skin: Negative for pallor and rash.  Neurological: Negative for dizziness, tremors, weakness, numbness and headaches.  Psychiatric/Behavioral: Negative for confusion, sleep disturbance and suicidal ideas.    Objective:  BP 132/82 (BP Location: Left Arm, Patient Position: Sitting, Cuff Size: Normal)   Pulse 61   Temp 98 F (36.7 C) (Oral)   Ht 5\' 4"  (1.626 m)   Wt 98 lb (44.5 kg)   BMI 16.82 kg/m   BP Readings from Last 3 Encounters:  11/10/19 132/82  09/23/19 (!) 116/52  09/05/19 (!) 149/70    Wt Readings from Last 3 Encounters:  11/10/19 98 lb (44.5 kg)  09/23/19 99 lb 6.4 oz (45.1 kg)  09/05/19 100 lb (45.4 kg)    Physical Exam Constitutional:      General: She is not in acute distress.    Appearance: She is well-developed.  HENT:     Head: Normocephalic.     Right Ear: External ear normal.     Left Ear: External ear normal.     Nose: Nose normal.  Eyes:     General:        Right eye: No discharge.        Left eye: No discharge.     Conjunctiva/sclera: Conjunctivae normal.     Pupils: Pupils are equal, round, and reactive to light.  Neck:  Thyroid: No thyromegaly.     Vascular: No JVD.     Trachea: No tracheal deviation.  Cardiovascular:     Rate and Rhythm: Normal rate and regular rhythm.     Heart sounds: Normal heart sounds.  Pulmonary:     Effort: No respiratory distress.     Breath sounds: No stridor. No wheezing.  Abdominal:     General: Bowel sounds are normal. There is no distension.     Palpations: Abdomen is soft. There is no mass.     Tenderness: There is no abdominal tenderness. There is no guarding or rebound.  Musculoskeletal:        General: No tenderness.     Cervical back: Normal range of motion and neck supple.  Lymphadenopathy:     Cervical: No cervical adenopathy.  Skin:    Findings: No erythema or rash.  Neurological:     Cranial Nerves: No cranial nerve deficit.     Motor: No  abnormal muscle tone.     Coordination: Coordination normal.     Deep Tendon Reflexes: Reflexes normal.  Psychiatric:        Behavior: Behavior normal.        Thought Content: Thought content normal.        Judgment: Judgment normal.   thin Mild bruit B  Lab Results  Component Value Date   WBC 5.8 09/03/2019   HGB 12.5 09/03/2019   HCT 35.8 09/03/2019   PLT 222 09/03/2019   GLUCOSE 95 09/03/2019   CHOL 169 10/22/2019   TRIG 43 10/22/2019   HDL 95 10/22/2019   LDLDIRECT 109.2 02/05/2012   LDLCALC 65 10/22/2019   ALT 17 11/07/2019   AST 25 11/07/2019   NA 133 (L) 09/03/2019   K 5.0 09/03/2019   CL 99 09/03/2019   CREATININE 0.57 09/03/2019   BUN 15 09/03/2019   CO2 27 09/03/2019   TSH 2.02 05/06/2019    CARDIAC CATHETERIZATION  Result Date: 09/05/2019  RPAV lesion is 70% stenosed.  Prox RCA to Mid RCA lesion is 25% stenosed.  Prox LAD lesion is 70% stenosed.  Mid LAD lesion is 90% stenosed.  1st Diag lesion is 90% stenosed.  The left ventricular systolic function is normal.  LV end diastolic pressure is normal.  The left ventricular ejection fraction is 55-65% by visual estimate.  1. Severe 2 vessel obstructive CAD    - 70% LAD proximally at the bifurcation with a very large diagonal branch. Segmental 90% mid LAD stenosis. This vessel is heavily calcified and tortuous    - 90% ostial large first diagonal    - 70% PL branch of the RCA 2. Normal LV function 3. Normal LVEDP Plan: recommend aggressive medical therapy. Given complexity of her disease she is not a candidate for PCI. If she has refractory angina I would recommend referral for CABG.    Assessment & Plan:   There are no diagnoses linked to this encounter.   No orders of the defined types were placed in this encounter.    Follow-up: No follow-ups on file.  Sonda Primes, MD

## 2019-11-10 NOTE — Assessment & Plan Note (Signed)
simvastatin 

## 2019-11-10 NOTE — Assessment & Plan Note (Signed)
Fosamax Vit D 

## 2019-11-10 NOTE — Telephone Encounter (Signed)
The patient has been notified of the result and verbalized understanding.  All questions (if any) were answered. [name], RN 11/10/2019 1:57 PM

## 2019-11-10 NOTE — Telephone Encounter (Signed)
-----   Message from Quintella Reichert, MD sent at 11/10/2019  1:04 PM EST ----- OK to start Crestor 20mg  daily and get an FLP and ALT in 6 weeks

## 2019-11-10 NOTE — Assessment & Plan Note (Signed)
F/u w/ Dr Mayford Knife Severe 2 vessel obstructive CAD    - 70% LAD proximally at the bifurcation with a very large diagonal branch. Segmental 90% mid LAD stenosis. This vessel is heavily calcified and tortuous    - 90% ostial large first diagonal    - 70% PL branch of the RCA 2. Normal LV function 3. Normal LVEDP  On a statin - had elev LFTs - being watched

## 2019-11-11 NOTE — Telephone Encounter (Signed)
Did not call pt about any labs

## 2019-11-12 ENCOUNTER — Ambulatory Visit: Payer: Medicare PPO

## 2019-11-13 NOTE — Addendum Note (Signed)
Addended by: Scarlett Presto on: 11/13/2019 11:11 AM   Modules accepted: Orders

## 2019-11-15 ENCOUNTER — Ambulatory Visit: Payer: Medicare PPO | Attending: Internal Medicine

## 2019-11-15 DIAGNOSIS — Z23 Encounter for immunization: Secondary | ICD-10-CM

## 2019-11-15 NOTE — Progress Notes (Signed)
   Covid-19 Vaccination Clinic  Name:  Brittany Mathis    MRN: [id number] DOB: [birth year]  11/15/2019  Brittany Mathis was observed post Covid-19 immunization for 15 minutes without incidence. She was provided with Vaccine Information Sheet and instruction to access the V-Safe system.   Brittany Mathis was instructed to call 911 with any severe reactions post vaccine: [name] Difficulty breathing  . Swelling of your face and throat  . A fast heartbeat  . A bad rash all over your body  . Dizziness and weakness    Immunizations Administered    Name Date Dose VIS Date Route   Pfizer COVID-19 Vaccine 11/15/2019 10:30 AM 0.3 mL 09/26/2019 Intramuscular   Manufacturer: [employer], [employer]   Lot: [id number]   NDC: [address]-5

## 2019-11-18 ENCOUNTER — Other Ambulatory Visit: Payer: Self-pay

## 2019-11-18 ENCOUNTER — Ambulatory Visit (HOSPITAL_COMMUNITY)
Admission: RE | Admit: 2019-11-18 | Discharge: 2019-11-18 | Disposition: A | Discharge status: Home / Self Care | Payer: Medicare PPO | Source: Ambulatory Visit | Attending: Internal Medicine | Admitting: Internal Medicine

## 2019-11-18 DIAGNOSIS — R0989 Other specified symptoms and signs involving the circulatory and respiratory systems: Secondary | ICD-10-CM

## 2019-11-19 ENCOUNTER — Encounter (HOSPITAL_COMMUNITY): Payer: Medicare PPO

## 2019-11-26 NOTE — Progress Notes (Signed)
Cardiology Office Note:    Date:  11/27/2019   ID:  Brittany Mathis, DOB 05-13-1946, MRN [id number]  PCP:  Tresa Garter, MD  Cardiologist:  Armanda Magic, MD    Referring MD: Tresa Garter, MD   Chief Complaint  Patient presents with  . Coronary Artery Disease  . Hypertension  . Hyperlipidemia    History of Present Illness:    Brittany Mathis is a 74 y.o. female with a hx of HTN and HLD who underwent risk factor assessment for CAD with coronary artery calcium screening and was found to have a calcium score of 1431. She was seen 08/2019  a strong family hx of CAD with her father having an MI in his 27's.  Her mom had a CVA.  She had CRF besides her fm hx including HTN and HLD.   She had an episode of chest tightness around the time of the OV and underwent cardiac cath showing 70% RPAV, 25% RCA, 70% pLAD, 90% mLAD, 90% D1 and normal LVF.  Aggressive medical therapy was recommended.  It was recommended at the time of cath to pursue CABG for refractory angina as she was not a candidate for PCI due to complexity of her disease.   She is here today for followup and is doing well.  She denies any chest pain or pressure, SOB, DOE, PND, orthopnea, LE edema, dizziness, palpitations or syncope. She is compliant with her meds and is tolerating meds with no SE.    Past Medical History:  Diagnosis Date  . Carotid artery stenosis    1-39% bilateral by dopplers 11/2019  . Cataract   . History of shingles   . Hyperlipidemia   . Hypertension   . Nephrolithiasis 1972  . Osteoporosis 05/2017   T score -2.7  . Thyroid disease 2010   thyroiditis    Past Surgical History:  Procedure Laterality Date  . ABDOMINAL HYSTERECTOMY  1983   with BSO  menorrhagia, endometriosis  . cataracts Bilateral 2018  . LEFT HEART CATH AND CORONARY ANGIOGRAPHY N/A 09/05/2019   Procedure: LEFT HEART CATH AND CORONARY ANGIOGRAPHY;  Surgeon: [place], Peter M, MD;  Location: [facility] INVASIVE CV LAB;  Service:  Cardiovascular;  Laterality: N/A;  . OOPHORECTOMY     BSO    Current Medications: Current Meds  Medication Sig  . alendronate (FOSAMAX) 70 MG tablet Take 1 tablet (70 mg total) by mouth every 7 (seven) days. Take with a full glass of water on an empty stomach. (Patient taking differently: Take 70 mg by mouth every Tuesday. Take with a full glass of water on an empty stomach.)  . aspirin EC 81 MG tablet Take 81 mg by mouth daily with lunch.   . Cholecalciferol (VITAMIN D) 50 MCG (2000 UT) tablet Take 2,000 Units by mouth daily with lunch.  . DIOVAN 160 MG tablet Take 0.5 tablets (80 mg total) by mouth daily. (Patient taking differently: Take 160 mg by mouth every other day. )  . estradiol (CLIMARA - DOSED IN MG/24 HR) 0.0375 mg/24hr patch Place 0.0375 mg onto the skin 5 days.   . metoprolol succinate (TOPROL XL) 25 MG 24 hr tablet Take 1 tablet (25 mg total) by mouth daily. (Patient taking differently: Take 25 mg by mouth at bedtime. )  . nitroGLYCERIN (NITROSTAT) 0.4 MG SL tablet Place 1 tablet (0.4 mg total) under the tongue every 5 (five) minutes as needed for chest pain.  [name] Propylene Glycol (SYSTANE COMPLETE) 0.6 %  SOLN Place 1 drop into both eyes daily.  . rosuvastatin (CRESTOR) 20 MG tablet Take 1 tablet (20 mg total) by mouth daily.     Allergies:   Influenza vaccines   Social History   Socioeconomic History  . Marital status: Married    Spouse name: Not on file  . Number of children: Not on file  . Years of education: Not on file  . Highest education level: Not on file  Occupational History  . Not on file  Tobacco Use  . Smoking status: Never Smoker  . Smokeless tobacco: Never Used  . Tobacco comment: experimented in college  Substance and Sexual Activity  . Alcohol use: Yes    Alcohol/week: 14.0 standard drinks    Types: 14 Standard drinks or equivalent per week  . Drug use: No  . Sexual activity: Yes    Birth control/protection: Surgical    Comment: HYST-1st  intercourse 74 yo-1 partner  Other Topics Concern  . Not on file  Social History Narrative  . Not on file   Social Determinants of Health   Financial Resource Strain: Low Risk   . Difficulty of Paying Living Expenses: Not hard at all  Food Insecurity: No Food Insecurity  . Worried About Programme researcher, broadcasting/film/video in the Last Year: Never true  . Ran Out of Food in the Last Year: Never true  Transportation Needs: No Transportation Needs  . Lack of Transportation (Medical): No  . Lack of Transportation (Non-Medical): No  Physical Activity: Inactive  . Days of Exercise per Week: 0 days  . Minutes of Exercise per Session: 0 min  Stress: No Stress Concern Present  . Feeling of Stress : Not at all  Social Connections: Not Isolated  . Frequency of Communication with Friends and Family: More than three times a week  . Frequency of Social Gatherings with Friends and Family: More than three times a week  . Attends Religious Services: 1 to 4 times per year  . Active Member of Clubs or Organizations: Yes  . Attends Banker Meetings: Not on file  . Marital Status: Married     Family History: The patient's family history includes Colon cancer in her maternal aunt; Glaucoma in her father; Hyperlipidemia in her father and mother; Hypertension in her father and mother; Kidney disease in her father; Stroke in her mother.  ROS:   Please see the history of present illness.    ROS  All other systems reviewed and negative.   EKGs/Labs/Other Studies Reviewed:    The following studies were reviewed today: Cardiac cath, labs from PCP  EKG:  EKG is not ordered today.    Recent Labs: 05/06/2019: TSH 2.02 09/03/2019: BUN 15; Creatinine, Ser 0.57; Hemoglobin 12.5; Platelets 222; Potassium 5.0; Sodium 133 11/07/2019: ALT 17   Recent Lipid Panel    Component Value Date/Time   CHOL 169 10/22/2019 0848   TRIG 43 10/22/2019 0848   HDL 95 10/22/2019 0848   CHOLHDL 1.8 10/22/2019 0848    CHOLHDL 2 05/06/2019 1121   VLDL 13.2 05/06/2019 1121   LDLCALC 65 10/22/2019 0848   LDLDIRECT 109.2 02/05/2012 0905    Physical Exam:    VS:  BP (!) 146/86   Pulse 61   Ht 5\' 4"  (1.626 m)   Wt 100 lb 9.6 oz (45.6 kg)   SpO2 96%   BMI 17.27 kg/m     Wt Readings from Last 3 Encounters:  11/27/19 100 lb 9.6 oz (45.6  kg)  11/10/19 98 lb (44.5 kg)  09/23/19 99 lb 6.4 oz (45.1 kg)     GEN:  Well nourished, well developed in no acute distress HEENT: Normal NECK: No JVD; No carotid bruits LYMPHATICS: No lymphadenopathy CARDIAC: RRR, no murmurs, rubs, gallops RESPIRATORY:  Clear to auscultation without rales, wheezing or rhonchi  ABDOMEN: Soft, non-tender, non-distended MUSCULOSKELETAL:  No edema; No deformity  SKIN: Warm and dry NEUROLOGIC:  Alert and oriented x 3 PSYCHIATRIC:  Normal affect   ASSESSMENT:    1. Coronary artery disease involving native coronary artery of native heart without angina pectoris   2. Essential hypertension   3. Dyslipidemia   4. Bilateral carotid artery stenosis    PLAN:    In order of problems listed above:  1.  ASCAD -cath showing 70% RPAV, 25% RCA, 70% pLAD, 90% mLAD, 90% D1 -It was recommended at the time of cath to pursue CABG for refractory angina as she was not a candidate for PCI due to complexity of her disease.  -she has been on medical therapy with no anginal sx -continue ASA 81mg  daily, statin and BB -discussed stopping HRT - she will discuss with her new GYN  2.  HTN -BP is borderline controlled -continue Diovan 80mg  daily, Toprol XL 25mg  -creatinine 0.57 and K+ 5 in Nov 2020 -I have asked her to check her BP daily for a week and call with the results.  3.  HLD -LDL goal < 70 -LDL 65 in Jan 2021 -continue Crestor 20mg  daily  Medication Adjustments/Labs and Tests Ordered: Current medicines are reviewed at length with the patient today.  Concerns regarding medicines are outlined above.  No orders of the defined types  were placed in this encounter.  No orders of the defined types were placed in this encounter.   Signed, Fransico Him, MD  11/27/2019 9:33 AM    [place]

## 2019-11-27 ENCOUNTER — Other Ambulatory Visit: Payer: Self-pay

## 2019-11-27 ENCOUNTER — Ambulatory Visit: Payer: Medicare PPO | Admitting: Cardiology

## 2019-11-27 ENCOUNTER — Encounter: Payer: Self-pay | Admitting: Cardiology

## 2019-11-27 VITALS — BP 146/86 | HR 61 | Ht 64.0 in | Wt 100.6 lb

## 2019-11-27 DIAGNOSIS — I2510 Atherosclerotic heart disease of native coronary artery without angina pectoris: Secondary | ICD-10-CM

## 2019-11-27 DIAGNOSIS — I10 Essential (primary) hypertension: Secondary | ICD-10-CM | POA: Diagnosis not present

## 2019-11-27 DIAGNOSIS — I6529 Occlusion and stenosis of unspecified carotid artery: Secondary | ICD-10-CM | POA: Insufficient documentation

## 2019-11-27 DIAGNOSIS — E785 Hyperlipidemia, unspecified: Secondary | ICD-10-CM

## 2019-11-27 DIAGNOSIS — I6523 Occlusion and stenosis of bilateral carotid arteries: Secondary | ICD-10-CM

## 2019-11-27 NOTE — Patient Instructions (Signed)
Medication Instructions:  Your physician recommends that you continue on your current medications as directed. Please refer to the Current Medication list given to you today.  *If you need a refill on your cardiac medications before your next appointment, please call your pharmacy*  Follow-Up: At [facility], you and your health needs are our priority.  As part of our continuing mission to provide you with exceptional heart care, we have created designated Provider Care Teams.  These Care Teams include your primary Cardiologist (physician) and Advanced Practice Providers (APPs -  Physician Assistants and Nurse Practitioners) who all work together to provide you with the care you need, when you need it.  Your next appointment:   1 year(s)  The format for your next appointment:   In Person  Provider:   Armanda Magic, MD  Other Instructions Please take your blood pressure daily for 1 week and call us with your readings.

## 2019-12-09 ENCOUNTER — Encounter: Payer: Self-pay | Admitting: Obstetrics and Gynecology

## 2019-12-09 ENCOUNTER — Telehealth: Payer: Self-pay

## 2019-12-09 ENCOUNTER — Ambulatory Visit: Payer: Medicare PPO | Admitting: Obstetrics and Gynecology

## 2019-12-09 ENCOUNTER — Other Ambulatory Visit: Payer: Self-pay

## 2019-12-09 VITALS — BP 120/76

## 2019-12-09 DIAGNOSIS — R300 Dysuria: Secondary | ICD-10-CM | POA: Diagnosis not present

## 2019-12-09 DIAGNOSIS — Z79890 Hormone replacement therapy (postmenopausal): Secondary | ICD-10-CM

## 2019-12-09 DIAGNOSIS — N3001 Acute cystitis with hematuria: Secondary | ICD-10-CM

## 2019-12-09 MED ORDER — SULFAMETHOXAZOLE-TRIMETHOPRIM 400-80 MG PO TABS: 1.0000 | ORAL_TABLET | Freq: Two times a day (BID) | ORAL | 0 refills | Status: AC

## 2019-12-09 MED ORDER — PHENAZOPYRIDINE HCL 200 MG PO TABS: 200.0000 mg | ORAL_TABLET | Freq: Three times a day (TID) | ORAL | 0 refills | Status: AC | PRN

## 2019-12-09 NOTE — Progress Notes (Signed)
   Brittany Mathis  1946-02-28 [id number]  HPI The patient is a 74 y.o. [name] who presents today for a few day history of pelvic pain, pain with urination, increased urinary frequency.  Feels a lot of irritation in the vaginal area but denies any vaginal discharge or bleeding.  Still recently diagnosed with carotid artery stenosis.  She denies any fever or chills.  She is using a Climara patch.  Current Outpatient Medications on File Prior to Visit  Medication Sig Dispense Refill  . alendronate (FOSAMAX) 70 MG tablet Take 1 tablet (70 mg total) by mouth every 7 (seven) days. Take with a full glass of water on an empty stomach. (Patient taking differently: Take 70 mg by mouth every Tuesday. Take with a full glass of water on an empty stomach.) 12 tablet 4  . aspirin EC 81 MG tablet Take 81 mg by mouth daily with lunch.     . Cholecalciferol (VITAMIN D) 50 MCG (2000 UT) tablet Take 2,000 Units by mouth daily with lunch.    . DIOVAN 160 MG tablet Take 0.5 tablets (80 mg total) by mouth daily. (Patient taking differently: Take 160 mg by mouth every other day. ) 90 tablet 3  . estradiol (CLIMARA - DOSED IN MG/24 HR) 0.0375 mg/24hr patch Place 0.0375 mg onto the skin 5 days.     . metoprolol succinate (TOPROL XL) 25 MG 24 hr tablet Take 1 tablet (25 mg total) by mouth daily. (Patient taking differently: Take 25 mg by mouth at bedtime. ) 30 tablet 11  . nitroGLYCERIN (NITROSTAT) 0.4 MG SL tablet Place 1 tablet (0.4 mg total) under the tongue every 5 (five) minutes as needed for chest pain. 25 tablet 3  . Propylene Glycol (SYSTANE COMPLETE) 0.6 % SOLN Place 1 drop into both eyes daily.    . rosuvastatin (CRESTOR) 20 MG tablet Take 1 tablet (20 mg total) by mouth daily. 90 tablet 3   No current facility-administered medications on file prior to visit.     Past medical history,surgical history, problem list, medications, allergies, family history and social history were all reviewed and documented as  reviewed in the EPIC chart.  ROS:  Feeling well. No dyspnea or chest pain on exertion.  No abdominal pain, change in bowel habits, black or bloody stools.  + urinary tract symptoms as described above.  Physical Exam  BP 120/76  General: Pleasant female, no acute distress, alert and oriented PELVIC EXAM: VULVA: normal appearing vulva with no masses, tenderness or lesions, VAGINA: normal appearing vagina with normal color and discharge, no lesions, CERVIX: normal appearing cervix without discharge or lesions  Brittany Mathis present during the examination  Urinalysis indicates 40-60 WBC, RBC packed, 6-10 squamous epithelial, many bacteria, no yeast, positive nitrite, leuk esterase 2+  Assessment 74 year old G1P1 with acute cystitis and new diagnosis of vascular disease  Plan Bactrim DS twice daily for 5 days Pyridium 200 mg 3 times daily as needed We will send urine for culture No evidence of vaginitis or postmenopausal bleeding on examination today Recommend that she wean off of the Climara patch in light of her vascular disease to reduce her risk of forming thrombus.  She plans to do this by using the Climara patch for 5 days on 5 days off and then weaning off over the next month entirely.  She will let me know if there are any problems.  [name] MD 12/09/19

## 2019-12-09 NOTE — Patient Instructions (Signed)
Take 5 days of Bactrim DS antibiotic twice daily Take the Pyridium tablet up to 3 times per day as needed for bladder pain/spasms for up to 2 days, may turn the urine an orange color

## 2019-12-09 NOTE — Telephone Encounter (Signed)
Called complaining of UTI sx and that she is "miserable". She asked if she could get appointment right away. Brittany Mathis called her and scheduled appt this afternoon.

## 2019-12-11 DIAGNOSIS — Z85828 Personal history of other malignant neoplasm of skin: Secondary | ICD-10-CM | POA: Diagnosis not present

## 2019-12-11 DIAGNOSIS — L570 Actinic keratosis: Secondary | ICD-10-CM | POA: Diagnosis not present

## 2019-12-12 LAB — URINALYSIS, COMPLETE W/RFL CULTURE
Bilirubin Urine: NEGATIVE
Glucose, UA: NEGATIVE
Hyaline Cast: NONE SEEN /LPF
Ketones, ur: NEGATIVE
Nitrites, Initial: POSITIVE — AB
Specific Gravity, Urine: 1.01 (ref 1.001–1.03)
pH: 7 (ref 5.0–8.0)

## 2019-12-12 LAB — URINE CULTURE
MICRO NUMBER:: 10178194
SPECIMEN QUALITY:: ADEQUATE

## 2019-12-12 LAB — CULTURE INDICATED

## 2019-12-18 ENCOUNTER — Telehealth: Payer: Self-pay | Admitting: Internal Medicine

## 2019-12-18 NOTE — Telephone Encounter (Signed)
Okay to switch to generic

## 2019-12-18 NOTE — Telephone Encounter (Signed)
New message:   DIOVAN 160 MG tablet pt is calling to see if she can possibly get a prescription for the generic of this medication and still take the same dosage that has been advised.

## 2019-12-19 MED ORDER — VALSARTAN 80 MG PO TABS: 80.0000 mg | ORAL_TABLET | Freq: Every day | ORAL | 3 refills | Status: DC

## 2019-12-19 NOTE — Telephone Encounter (Signed)
RX sent

## 2019-12-19 NOTE — Telephone Encounter (Signed)
Okay. Thank you.

## 2019-12-22 ENCOUNTER — Other Ambulatory Visit: Payer: Medicare PPO | Admitting: *Deleted

## 2019-12-22 ENCOUNTER — Other Ambulatory Visit: Payer: Self-pay

## 2019-12-22 DIAGNOSIS — E785 Hyperlipidemia, unspecified: Secondary | ICD-10-CM | POA: Diagnosis not present

## 2019-12-22 LAB — LIPID PANEL
Chol/HDL Ratio: 1.8 ratio (ref 0.0–4.4)
Cholesterol, Total: 177 mg/dL (ref 100–199)
HDL: 96 mg/dL (ref >39)
LDL Chol Calc (NIH): 70 mg/dL (ref 0–99)
Triglycerides: 58 mg/dL (ref 0–149)
VLDL Cholesterol Cal: 11 mg/dL (ref 5–40)

## 2019-12-22 LAB — ALT: ALT: 45 IU/L — ABNORMAL HIGH (ref 0–32)

## 2019-12-23 ENCOUNTER — Ambulatory Visit: Payer: Medicare PPO | Admitting: Obstetrics and Gynecology

## 2019-12-23 ENCOUNTER — Telehealth: Payer: Self-pay

## 2019-12-23 ENCOUNTER — Encounter: Payer: Self-pay | Admitting: Obstetrics and Gynecology

## 2019-12-23 VITALS — BP 170/100 | Temp 97.8°F

## 2019-12-23 DIAGNOSIS — N898 Other specified noninflammatory disorders of vagina: Secondary | ICD-10-CM | POA: Diagnosis not present

## 2019-12-23 DIAGNOSIS — R300 Dysuria: Secondary | ICD-10-CM | POA: Diagnosis not present

## 2019-12-23 DIAGNOSIS — Z79899 Other long term (current) drug therapy: Secondary | ICD-10-CM

## 2019-12-23 DIAGNOSIS — N3001 Acute cystitis with hematuria: Secondary | ICD-10-CM

## 2019-12-23 DIAGNOSIS — E785 Hyperlipidemia, unspecified: Secondary | ICD-10-CM

## 2019-12-23 LAB — WET PREP FOR TRICH, YEAST, CLUE

## 2019-12-23 MED ORDER — CEFDINIR 300 MG PO CAPS: 300.0000 mg | ORAL_CAPSULE | Freq: Two times a day (BID) | ORAL | 0 refills | Status: AC

## 2019-12-23 NOTE — Telephone Encounter (Signed)
-----   Message from Quintella Reichert, MD sent at 12/22/2019  8:16 PM EST ----- Please let patient know that LDL was at goal.  Please recheck hepatic panel in  4 weeks due to mildly elevated ALT.  Continue current medical therapy.

## 2019-12-23 NOTE — Progress Notes (Signed)
   Brittany Mathis  02-13-1946 [id number]  HPI The patient is a 74 y.o. [name] who presents today for continued dysuria.  She was last seen 12/09/2019 and diagnosed with a UTI.  She was put on Bactrim DS for 5 days and also given 6 tablets of Pyridium.  The urine culture did grow pansensitive E. Coli.  She felt better after the 5 day course of Bactrim DS, but possibly did feel a 'twinge' of urinary discomfort afterwards.  She had been feeling fine until early this morning she woke up with a lot of urinary discomfort/dysuria and upper vaginal "stinging."  She has not been sexually active since she the antibiotics.  No Vaginal discharge.  Past medical history,surgical history, problem list, medications, allergies, family history and social history were all reviewed and documented as reviewed in the EPIC chart.  Review of systems: No vaginal bleeding, no vaginal discharge, + dysuria, see symptoms as described in HPI  Physical Exam  BP (!) 170/100 (BP Location: Right Arm, Patient Position: Sitting, Cuff Size: Normal)   Temp 97.8 F (36.6 C) (Oral)   BP 136/78 on recheck  General: Pleasant female, no acute distress, alert and oriented PELVIC EXAM: VULVA: normal appearing vulva with no masses, tenderness or lesions, atrophic changes noted, VAGINA: normal appearing vagina with normal color and discharge, no lesions, CERVIX: normal appearing cervix without discharge or lesions, UTERUS: uterus is normal size, shape, consistency and nontender  WET MOUNT done - results: negative for pathogens, normal epithelial cells Urinalysis shows >60 WBC, >60 RBC, many bacteria, 10-20 squamous epithelial cells, negative nitrite, 2+ leukocyte esterase, no yeast, no casts  [name] present for the examination  Assessment 74 yo G1P1 with recurrence of urinary tract infection  Plan We reviewed the culture results from her last urine sample and although the isolate was sensitive to Bactrim DS, higher concentration to  achieve sensitivity was noted.  This time, she will try Omnicef 300 mg twice daily for 7 days.  Currently her insurance would not cover Pyridium which was prescribed last time, so I told her to finish up her remaining tablets as needed and to buy over-the-counter Azo instead.  Hygiene practices are reviewed, and it sounds that she is doing all the right things to avoid UTI recurrence.  She is relieved there is no evidence of vaginitis.  Please return if any recurrence of symptoms or they do not improve.   [name] MD 12/23/19

## 2019-12-23 NOTE — Telephone Encounter (Signed)
The patient has been notified of the result and verbalized understanding.  All questions (if any) were answered. Lab work has been scheduled.  [name], RN 12/23/2019 8:18 AM

## 2019-12-26 LAB — URINALYSIS, COMPLETE W/RFL CULTURE
Bilirubin Urine: NEGATIVE
Glucose, UA: NEGATIVE
Hyaline Cast: NONE SEEN /LPF
Ketones, ur: NEGATIVE
Nitrites, Initial: NEGATIVE
Protein, ur: NEGATIVE
RBC / HPF: >=60 /HPF — AB (ref 0–2)
Specific Gravity, Urine: 1.015 (ref 1.001–1.03)
WBC, UA: >=60 /HPF — AB (ref 0–5)
pH: 7 (ref 5.0–8.0)

## 2019-12-26 LAB — URINE CULTURE
MICRO NUMBER:: 10230749
SPECIMEN QUALITY:: ADEQUATE

## 2019-12-26 LAB — CULTURE INDICATED

## 2020-01-20 ENCOUNTER — Other Ambulatory Visit: Payer: Self-pay

## 2020-01-20 ENCOUNTER — Other Ambulatory Visit: Payer: Medicare PPO

## 2020-01-20 DIAGNOSIS — E785 Hyperlipidemia, unspecified: Secondary | ICD-10-CM | POA: Diagnosis not present

## 2020-01-20 DIAGNOSIS — Z79899 Other long term (current) drug therapy: Secondary | ICD-10-CM

## 2020-01-20 LAB — HEPATIC FUNCTION PANEL
ALT: 34 IU/L — ABNORMAL HIGH (ref 0–32)
AST: 42 IU/L — ABNORMAL HIGH (ref 0–40)
Albumin: 4.3 g/dL (ref 3.7–4.7)
Alkaline Phosphatase: 46 IU/L (ref 39–117)
Bilirubin Total: 0.3 mg/dL (ref 0.0–1.2)
Bilirubin, Direct: 0.11 mg/dL (ref 0.00–0.40)
Total Protein: 6.3 g/dL (ref 6.0–8.5)

## 2020-01-21 ENCOUNTER — Telehealth: Payer: Self-pay

## 2020-01-21 NOTE — Telephone Encounter (Signed)
New message    The patient wanted to know if she was updated on the immunization shot.

## 2020-01-22 NOTE — Telephone Encounter (Signed)
Pt notified that she is due for the shingrix vaccine but due to her having medicare she sill need to get it at the pharmacy

## 2020-02-27 ENCOUNTER — Other Ambulatory Visit: Payer: Self-pay | Admitting: Internal Medicine

## 2020-04-21 ENCOUNTER — Telehealth: Payer: Self-pay | Admitting: Internal Medicine

## 2020-04-21 DIAGNOSIS — Z1231 Encounter for screening mammogram for malignant neoplasm of breast: Secondary | ICD-10-CM | POA: Diagnosis not present

## 2020-04-21 NOTE — Telephone Encounter (Signed)
    Patient calling to report diarrhea x8 days, gas, stomach cramping No other symptoms  Seeking advice only

## 2020-04-22 NOTE — Telephone Encounter (Signed)
Notified pt w/MD response.../lmb 

## 2020-04-22 NOTE — Telephone Encounter (Signed)
Imodium AD prn OV w/any provider Thx

## 2020-05-10 ENCOUNTER — Ambulatory Visit: Payer: Medicare PPO | Admitting: Obstetrics and Gynecology

## 2020-05-10 ENCOUNTER — Encounter: Payer: Self-pay | Admitting: Obstetrics and Gynecology

## 2020-05-10 ENCOUNTER — Other Ambulatory Visit: Payer: Self-pay

## 2020-05-10 ENCOUNTER — Encounter: Payer: Medicare Other | Admitting: Gynecology

## 2020-05-10 VITALS — BP 124/82 | Ht 64.0 in | Wt 98.0 lb

## 2020-05-10 DIAGNOSIS — Z01419 Encounter for gynecological examination (general) (routine) without abnormal findings: Secondary | ICD-10-CM | POA: Diagnosis not present

## 2020-05-10 DIAGNOSIS — M810 Age-related osteoporosis without current pathological fracture: Secondary | ICD-10-CM

## 2020-05-10 MED ORDER — ALENDRONATE SODIUM 70 MG PO TABS: 70.0000 mg | ORAL_TABLET | ORAL | 3 refills | Status: DC

## 2020-05-10 NOTE — Progress Notes (Signed)
Brittany Mathis [birth year] [id number]  SUBJECTIVE:  74 y.o. G53P1001 female here for an annual routine gynecologic exam. She has no gynecologic concerns.  Current Outpatient Medications  Medication Sig Dispense Refill  . alendronate (FOSAMAX) 70 MG tablet Take 1 tablet (70 mg total) by mouth every 7 (seven) days. Take with a full glass of water on an empty stomach. (Patient taking differently: Take 70 mg by mouth every Tuesday. Take with a full glass of water on an empty stomach.) 12 tablet 4  . aspirin EC 81 MG tablet Take 81 mg by mouth daily with lunch.     . Cholecalciferol (VITAMIN D) 50 MCG (2000 UT) tablet Take 2,000 Units by mouth daily with lunch.    . metoprolol succinate (TOPROL XL) 25 MG 24 hr tablet Take 1 tablet (25 mg total) by mouth daily. (Patient taking differently: Take 25 mg by mouth at bedtime. ) 30 tablet 11  . metroNIDAZOLE (METROCREAM) 0.75 % cream Apply topically 2 (two) times daily.    [name] Propylene Glycol (SYSTANE COMPLETE) 0.6 % SOLN Place 1 drop into both eyes daily.    . rosuvastatin (CRESTOR) 20 MG tablet Take 1 tablet (20 mg total) by mouth daily. 90 tablet 3  . valsartan (DIOVAN) 80 MG tablet Take 1 tablet (80 mg total) by mouth daily. 90 tablet 3  . nitroGLYCERIN (NITROSTAT) 0.4 MG SL tablet Place 1 tablet (0.4 mg total) under the tongue every 5 (five) minutes as needed for chest pain. 25 tablet 3   No current facility-administered medications for this visit.   Allergies: Influenza vaccines  No LMP recorded. Patient is postmenopausal.  Past medical history,surgical history, problem list, medications, allergies, family history and social history were all reviewed and documented as reviewed in the EPIC chart.  ROS:  Feeling well. No dyspnea or chest pain on exertion.  No abdominal pain, change in bowel habits, black or bloody stools.  No urinary tract symptoms. GYN ROS: no abnormal bleeding, pelvic pain or discharge, no breast pain or new or enlarging lumps on  self exam.  No neurological complaints.   OBJECTIVE:  BP 124/82   Ht 5\' 4"  (1.626 m)   Wt 98 lb (44.5 kg)   BMI 16.82 kg/m  The patient appears well, alert, oriented x 3, in no distress. ENT normal.  Neck supple. No cervical or supraclavicular adenopathy or thyromegaly.  Lungs are clear, good air entry, no wheezes, rhonchi or rales. S1 and S2 normal, no murmurs, regular rate and rhythm.  Abdomen soft without tenderness, guarding, mass or organomegaly.  Neurological is normal, no focal findings.  BREAST EXAM: breasts appear normal, no suspicious masses, no skin or nipple changes or axillary nodes  PELVIC EXAM: VULVA: normal appearing vulva with no masses, tenderness or lesions, atrophic changes, VAGINA: normal appearing vagina with normal color and discharge, no lesions, atrophic changes, CERVIX: surgically absent, UTERUS: surgically absent, vaginal cuff normal, ADNEXA: no masses, nontender  Chaperone: present during the examination  ASSESSMENT:  74 y.o. [name] here for annual gynecologic exam  PLAN:    1. Postmenopausal. Prior TAH/BSO for endometriosis and menorrhagia in 1983.  She did successfully wean off of estrogen patch therapy (recent diagnosis of carotic artery stenosis).  Minimal night sweats symptoms and doing well off of hormones.  She will let 1984 know if any of the symptoms return and cause her problems. 2. Pap smear 2012.  No significant history of abnormal Pap smears.  Is comfortable not continuing Pap  smear screening. 3. Mammogram 04/2020.  Normal breast exam today.  Continue with annual mammograms.   4. Colonoscopy 2009.  She knows she is due for this and says she is planning to repeat this summer. 5. Osteoporosis.  DEXA 2018 with T score -2.7.  She has been on alendronate for 2 years now.  I recommend repeating DEXA in the next month or so and she will plan to schedule.  Refill alendronate x1 year. 6. Health maintenance.  No labs today as she normally has  these completed elsewhere.  Return annually or sooner, prn.  [name] MD 05/10/20

## 2020-05-11 ENCOUNTER — Other Ambulatory Visit: Payer: Self-pay

## 2020-05-11 ENCOUNTER — Ambulatory Visit: Payer: Medicare PPO | Admitting: Internal Medicine

## 2020-05-11 ENCOUNTER — Encounter: Payer: Self-pay | Admitting: Internal Medicine

## 2020-05-11 VITALS — BP 138/86 | HR 63 | Temp 97.8°F | Ht 64.0 in | Wt 98.0 lb

## 2020-05-11 DIAGNOSIS — N951 Menopausal and female climacteric states: Secondary | ICD-10-CM

## 2020-05-11 DIAGNOSIS — E785 Hyperlipidemia, unspecified: Secondary | ICD-10-CM

## 2020-05-11 DIAGNOSIS — M818 Other osteoporosis without current pathological fracture: Secondary | ICD-10-CM | POA: Diagnosis not present

## 2020-05-11 DIAGNOSIS — I10 Essential (primary) hypertension: Secondary | ICD-10-CM | POA: Diagnosis not present

## 2020-05-11 DIAGNOSIS — I2510 Atherosclerotic heart disease of native coronary artery without angina pectoris: Secondary | ICD-10-CM

## 2020-05-11 DIAGNOSIS — K635 Polyp of colon: Secondary | ICD-10-CM | POA: Diagnosis not present

## 2020-05-11 NOTE — Progress Notes (Signed)
Subjective:  Patient ID: Brittany Mathis, female    DOB: 1946/02/05  Age: 74 y.o. MRN: [id number]  CC: No chief complaint on file.   HPI FATINA SPRANKLE presents for Vit D def, CAD, dyslipidemia f/u Pt stopped estrogen  Outpatient Medications Prior to Visit  Medication Sig Dispense Refill  . alendronate (FOSAMAX) 70 MG tablet Take 1 tablet (70 mg total) by mouth every Tuesday. Take with a full glass of water on an empty stomach. 12 tablet 3  . aspirin EC 81 MG tablet Take 81 mg by mouth daily with lunch.     . Cholecalciferol (VITAMIN D) 50 MCG (2000 UT) tablet Take 2,000 Units by mouth daily with lunch.    . metoprolol succinate (TOPROL XL) 25 MG 24 hr tablet Take 1 tablet (25 mg total) by mouth daily. (Patient taking differently: Take 25 mg by mouth at bedtime. ) 30 tablet 11  . metroNIDAZOLE (METROCREAM) 0.75 % cream Apply topically 2 (two) times daily.    [name] Propylene Glycol (SYSTANE COMPLETE) 0.6 % SOLN Place 1 drop into both eyes daily.    . rosuvastatin (CRESTOR) 20 MG tablet Take 1 tablet (20 mg total) by mouth daily. 90 tablet 3  . valsartan (DIOVAN) 80 MG tablet Take 1 tablet (80 mg total) by mouth daily. 90 tablet 3  . metroNIDAZOLE (METROGEL) 0.75 % gel     . nitroGLYCERIN (NITROSTAT) 0.4 MG SL tablet Place 1 tablet (0.4 mg total) under the tongue every 5 (five) minutes as needed for chest pain. 25 tablet 3   No facility-administered medications prior to visit.    ROS: Review of Systems  Constitutional: Negative for activity change, appetite change, chills, fatigue and unexpected weight change.  HENT: Negative for congestion, mouth sores and sinus pressure.   Eyes: Negative for visual disturbance.  Respiratory: Negative for cough and chest tightness.   Gastrointestinal: Negative for abdominal pain and nausea.  Genitourinary: Negative for difficulty urinating, frequency and vaginal pain.  Musculoskeletal: Negative for back pain and gait problem.  Skin: Negative for pallor and  rash.  Neurological: Negative for dizziness, tremors, weakness, numbness and headaches.  Psychiatric/Behavioral: Negative for confusion and sleep disturbance.    Objective:  BP (!) 138/86 (BP Location: Left Arm, Patient Position: Sitting, Cuff Size: Small)   Pulse 63   Temp 97.8 F (36.6 C) (Oral)   Ht 5\' 4"  (1.626 m)   Wt 98 lb (44.5 kg)   SpO2 96%   BMI 16.82 kg/m   BP Readings from Last 3 Encounters:  05/11/20 (!) 138/86  05/10/20 124/82  12/23/19 (!) 170/100    Wt Readings from Last 3 Encounters:  05/11/20 98 lb (44.5 kg)  05/10/20 98 lb (44.5 kg)  11/27/19 100 lb 9.6 oz (45.6 kg)    Physical Exam Constitutional:      General: She is not in acute distress.    Appearance: She is well-developed.  HENT:     Head: Normocephalic.     Right Ear: External ear normal.     Left Ear: External ear normal.     Nose: Nose normal.  Eyes:     General:        Right eye: No discharge.        Left eye: No discharge.     Conjunctiva/sclera: Conjunctivae normal.     Pupils: Pupils are equal, round, and reactive to light.  Neck:     Thyroid: No thyromegaly.     Vascular: No JVD.  Trachea: No tracheal deviation.  Cardiovascular:     Rate and Rhythm: Normal rate and regular rhythm.     Heart sounds: Normal heart sounds.  Pulmonary:     Effort: No respiratory distress.     Breath sounds: No stridor. No wheezing.  Abdominal:     General: Bowel sounds are normal. There is no distension.     Palpations: Abdomen is soft. There is no mass.     Tenderness: There is no abdominal tenderness. There is no guarding or rebound.  Musculoskeletal:        General: No tenderness.     Cervical back: Normal range of motion and neck supple.  Lymphadenopathy:     Cervical: No cervical adenopathy.  Skin:    Findings: No erythema or rash.  Neurological:     Cranial Nerves: No cranial nerve deficit.     Motor: No abnormal muscle tone.     Coordination: Coordination normal.     Deep Tendon  Reflexes: Reflexes normal.  Psychiatric:        Behavior: Behavior normal.        Thought Content: Thought content normal.        Judgment: Judgment normal.   thin  Lab Results  Component Value Date   WBC 5.8 09/03/2019   HGB 12.5 09/03/2019   HCT 35.8 09/03/2019   PLT 222 09/03/2019   GLUCOSE 95 09/03/2019   CHOL 177 12/22/2019   TRIG 58 12/22/2019   HDL 96 12/22/2019   LDLDIRECT 109.2 02/05/2012   LDLCALC 70 12/22/2019   ALT 34 (H) 01/20/2020   AST 42 (H) 01/20/2020   NA 133 (L) 09/03/2019   K 5.0 09/03/2019   CL 99 09/03/2019   CREATININE 0.57 09/03/2019   BUN 15 09/03/2019   CO2 27 09/03/2019   TSH 2.02 05/06/2019    VAS US CAROTID  Result Date: 11/19/2019 Carotid Arterial Duplex Study Indications:  Bilateral bruits. Patient denies any cerebrovascular symptoms, but               says she does have a strong family history of cardiac diseaes and               an elevated coronary calcium score. Risk Factors: Hypertension, hyperlipidemia, no history of smoking. Performing Technologist: Olegario Shearer RVT  Examination Guidelines: A complete evaluation includes B-mode imaging, spectral Doppler, color Doppler, and power Doppler as needed of all accessible portions of each vessel. Bilateral testing is considered an integral part of a complete examination. Limited examinations for reoccurring indications may be performed as noted.  Right Carotid Findings: +----------+--------+--------+--------+------------------+--------+           PSV cm/sEDV cm/sStenosisPlaque DescriptionComments +----------+--------+--------+--------+------------------+--------+ CCA Prox  74      15                                         +----------+--------+--------+--------+------------------+--------+ CCA Distal61      13                                         +----------+--------+--------+--------+------------------+--------+ ICA Prox  54      14      1-39%   heterogenous                +----------+--------+--------+--------+------------------+--------+ ICA Mid  52      18                                         +----------+--------+--------+--------+------------------+--------+ ICA Distal108     30                                         +----------+--------+--------+--------+------------------+--------+ ECA       48      7                                          +----------+--------+--------+--------+------------------+--------+ +----------+--------+-------+----------------+-------------------+           PSV cm/sEDV cmsDescribe        Arm Pressure (mmHG) +----------+--------+-------+----------------+-------------------+ [id number]            Multiphasic, [id number]                 +----------+--------+-------+----------------+-------------------+ +---------+--------+--+--------+--+---------+ VertebralPSV cm/s70EDV cm/s17Antegrade +---------+--------+--+--------+--+---------+  Left Carotid Findings: +----------+--------+--------+--------+------------------+--------+           PSV cm/sEDV cm/sStenosisPlaque DescriptionComments +----------+--------+--------+--------+------------------+--------+ CCA Prox  60      14                                         +----------+--------+--------+--------+------------------+--------+ CCA Distal54      16                                         +----------+--------+--------+--------+------------------+--------+ ICA Prox  60      18              heterogenous               +----------+--------+--------+--------+------------------+--------+ ICA Mid   87      26      1-39%                              +----------+--------+--------+--------+------------------+--------+ ICA Distal89      34                                         +----------+--------+--------+--------+------------------+--------+ ECA       63      5                                           +----------+--------+--------+--------+------------------+--------+ +----------+--------+--------+----------------+-------------------+           PSV cm/sEDV cm/sDescribe        Arm Pressure (mmHG) +----------+--------+--------+----------------+-------------------+ [id number]             Multiphasic, [id number]                 +----------+--------+--------+----------------+-------------------+ +---------+--------+--+--------+-+---------+ VertebralPSV cm/s23EDV cm/s7Antegrade +---------+--------+--+--------+-+---------+   Summary: Right Carotid: Velocities in the right ICA are consistent with  a 1-39% stenosis. Left Carotid: Velocities in the left ICA are consistent with a 1-39% stenosis. Vertebrals:  Bilateral vertebral arteries demonstrate antegrade flow. Subclavians: Normal flow hemodynamics were seen in bilateral subclavian              arteries. *See table(s) above for measurements and observations.  Electronically signed by Lance Muss MD on 11/19/2019 at 11:50:52 AM.    Final     Assessment & Plan:    Sonda Primes, MD

## 2020-05-11 NOTE — Assessment & Plan Note (Signed)
Off Vivelle

## 2020-05-11 NOTE — Assessment & Plan Note (Signed)
Due another colonoscopy Referral made

## 2020-05-11 NOTE — Assessment & Plan Note (Signed)
On symvastatin

## 2020-05-11 NOTE — Assessment & Plan Note (Signed)
On Fosamax since 2018

## 2020-05-11 NOTE — Assessment & Plan Note (Signed)
No CP 

## 2020-05-11 NOTE — Assessment & Plan Note (Signed)
Valsartan, Toprol 

## 2020-06-08 ENCOUNTER — Other Ambulatory Visit: Payer: Self-pay | Admitting: Obstetrics and Gynecology

## 2020-06-08 ENCOUNTER — Other Ambulatory Visit: Payer: Self-pay

## 2020-06-08 ENCOUNTER — Ambulatory Visit (INDEPENDENT_AMBULATORY_CARE_PROVIDER_SITE_OTHER): Payer: Medicare PPO

## 2020-06-08 DIAGNOSIS — Z780 Asymptomatic menopausal state: Secondary | ICD-10-CM

## 2020-06-08 DIAGNOSIS — M810 Age-related osteoporosis without current pathological fracture: Secondary | ICD-10-CM

## 2020-06-08 DIAGNOSIS — M8589 Other specified disorders of bone density and structure, multiple sites: Secondary | ICD-10-CM | POA: Diagnosis not present

## 2020-06-14 DIAGNOSIS — H524 Presbyopia: Secondary | ICD-10-CM | POA: Diagnosis not present

## 2020-06-14 DIAGNOSIS — H40053 Ocular hypertension, bilateral: Secondary | ICD-10-CM | POA: Diagnosis not present

## 2020-06-14 DIAGNOSIS — H26491 Other secondary cataract, right eye: Secondary | ICD-10-CM | POA: Diagnosis not present

## 2020-06-14 DIAGNOSIS — Z961 Presence of intraocular lens: Secondary | ICD-10-CM | POA: Diagnosis not present

## 2020-06-22 DIAGNOSIS — H26491 Other secondary cataract, right eye: Secondary | ICD-10-CM | POA: Diagnosis not present

## 2020-07-29 DIAGNOSIS — L821 Other seborrheic keratosis: Secondary | ICD-10-CM | POA: Diagnosis not present

## 2020-07-29 DIAGNOSIS — L812 Freckles: Secondary | ICD-10-CM | POA: Diagnosis not present

## 2020-07-29 DIAGNOSIS — Z85828 Personal history of other malignant neoplasm of skin: Secondary | ICD-10-CM | POA: Diagnosis not present

## 2020-07-29 DIAGNOSIS — D1801 Hemangioma of skin and subcutaneous tissue: Secondary | ICD-10-CM | POA: Diagnosis not present

## 2020-08-10 DIAGNOSIS — K648 Other hemorrhoids: Secondary | ICD-10-CM | POA: Diagnosis not present

## 2020-08-10 DIAGNOSIS — Z1211 Encounter for screening for malignant neoplasm of colon: Secondary | ICD-10-CM | POA: Diagnosis not present

## 2020-08-10 DIAGNOSIS — Z86010 Personal history of colonic polyps: Secondary | ICD-10-CM | POA: Diagnosis not present

## 2020-08-11 ENCOUNTER — Other Ambulatory Visit: Payer: Self-pay | Admitting: Cardiology

## 2020-09-16 ENCOUNTER — Ambulatory Visit (INDEPENDENT_AMBULATORY_CARE_PROVIDER_SITE_OTHER): Payer: Medicare PPO

## 2020-09-16 ENCOUNTER — Other Ambulatory Visit: Payer: Self-pay

## 2020-09-16 DIAGNOSIS — Z23 Encounter for immunization: Secondary | ICD-10-CM | POA: Diagnosis not present

## 2020-09-16 NOTE — Progress Notes (Signed)
Pt states she experiences normal side effects from the flu vaccine & would like to remove it as an allergy b/c she plans to continue to get the annual vaccination.

## 2020-10-03 IMAGING — CT CT HEART SCORING
2 series · 15 of 20 positions shown, 17 images · non-contrast
Comparison: None.
COMPARISON: None.

Addendum:
EXAM:
OVER-READ INTERPRETATION  CT CHEST

The following report is an over-read performed by radiologist Dr.
Madelinne Tauro [REDACTED] on 06/25/2019. This
over-read does not include interpretation of cardiac or coronary
anatomy or pathology. The coronary calcium score interpretation by
the cardiologist is attached.
TECHNIQUE: The patient was scanned on a Siemens Force scanner. Axial
non-contrast 3 mm slices were carried out through the heart. The
data set was analyzed on a dedicated work station and scored using
the Agatson method.

[Series 2: casc 3.0 i36f 2 bestdiast 69 % · axial · 0.32mm/px · z∈[-222,-114]mm · 8 of 48 slices shown, 10 images]
[im 6/48  vessel]
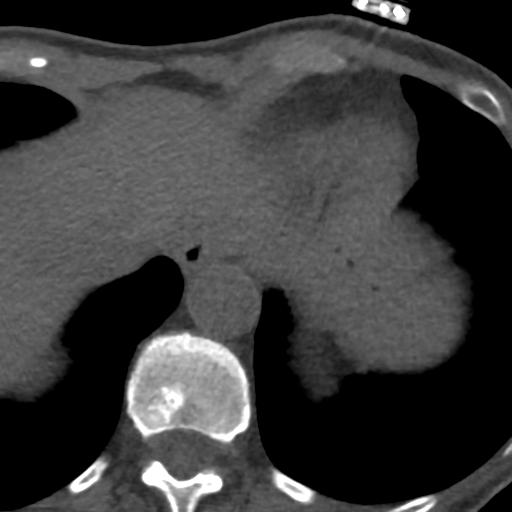
[im 6/48  lung]
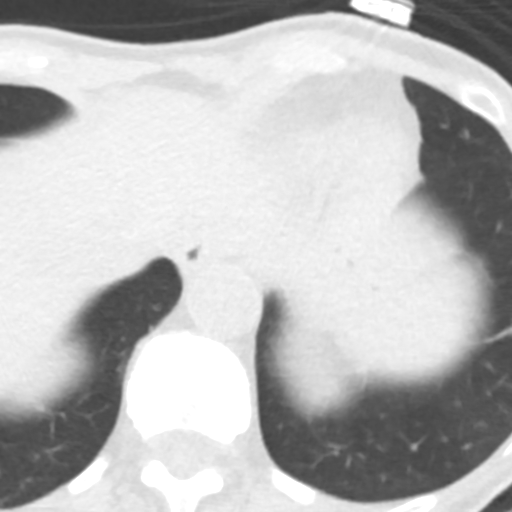
[im 11/48  vessel]
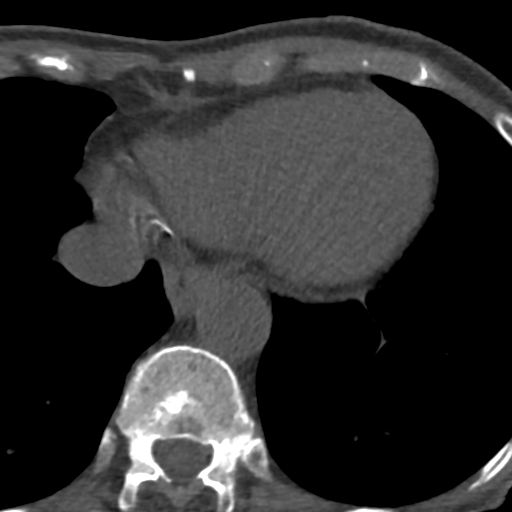
[im 16/48  vessel]
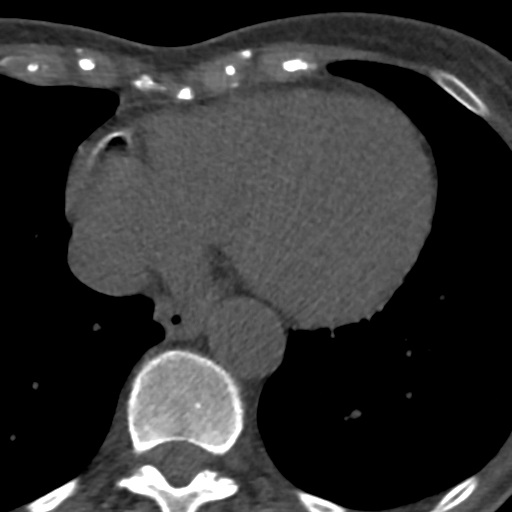
[im 21/48  vessel]
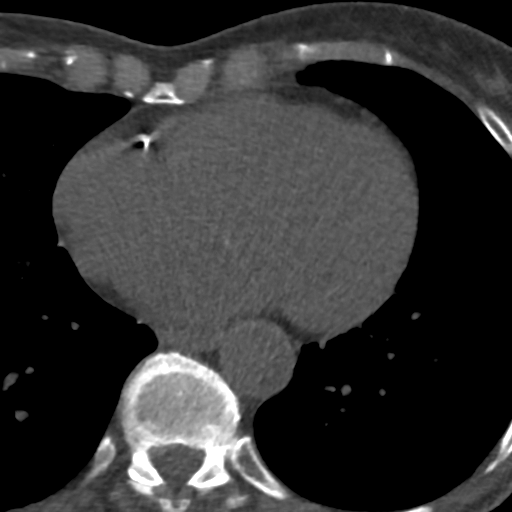
[im 27/48  vessel]
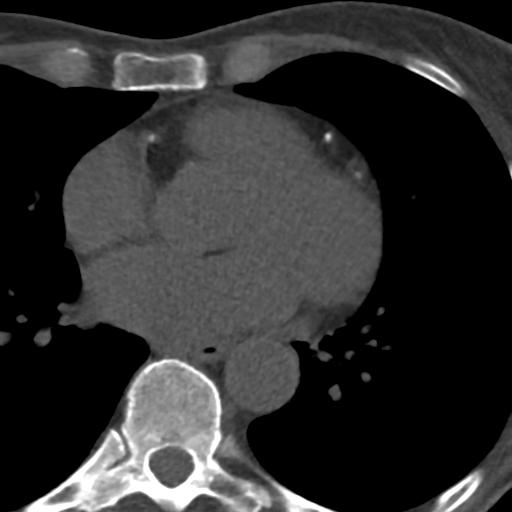
[im 27/48  lung]
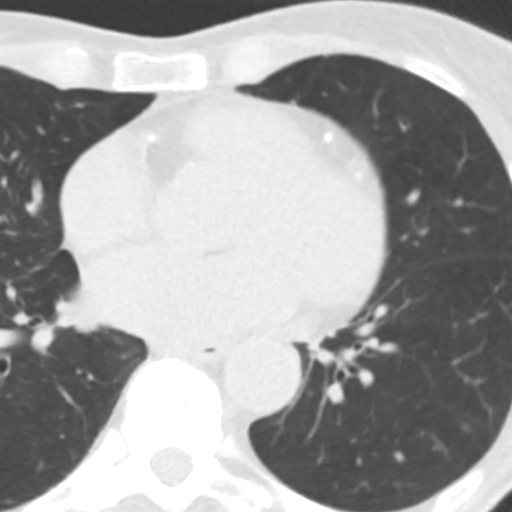
[im 32/48  vessel]
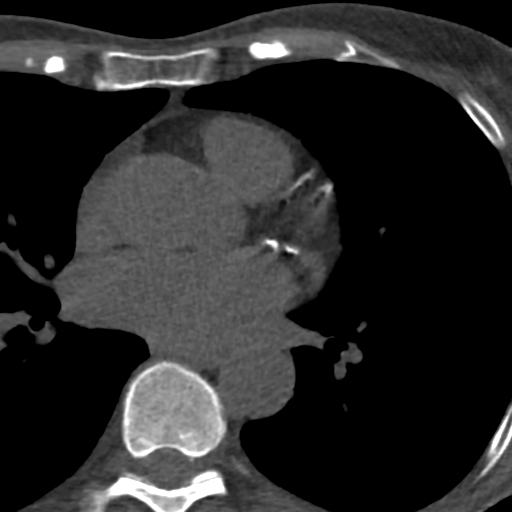
[im 37/48  vessel]
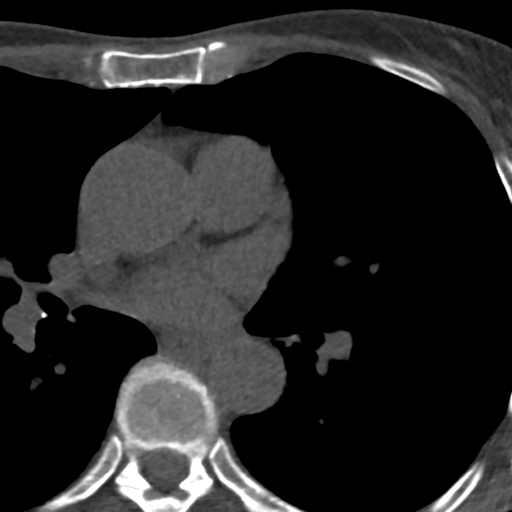
[im 42/48  vessel]
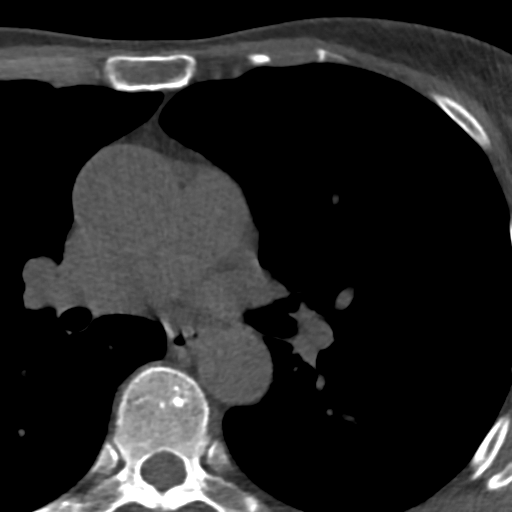

[Series 4: lung st 71 % · axial · 0.64mm/px · z∈[-222,-130]mm · 7 of 48 slices shown]
[im 6/48  lung]
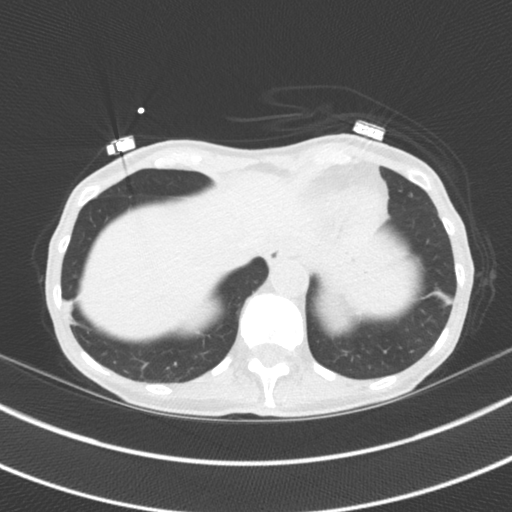
[im 11/48  lung]
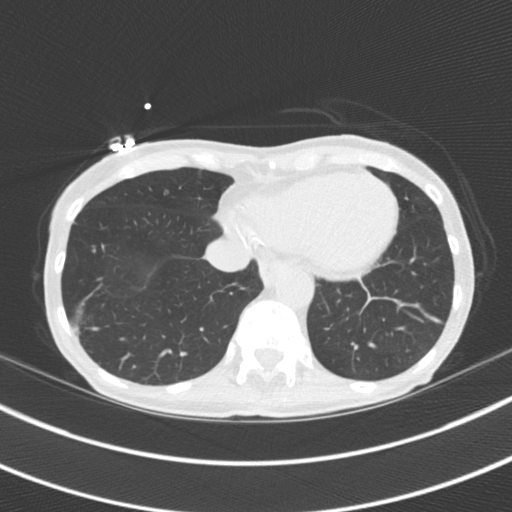
[im 16/48  lung]
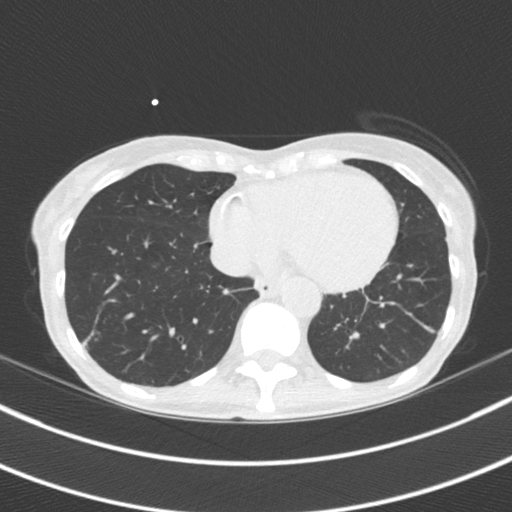
[im 21/48  lung]
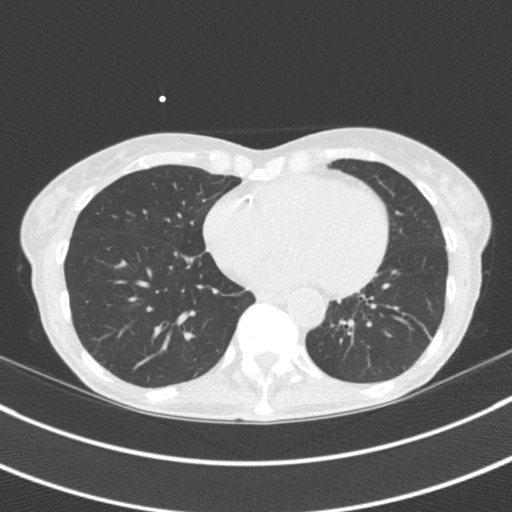
[im 27/48  lung]
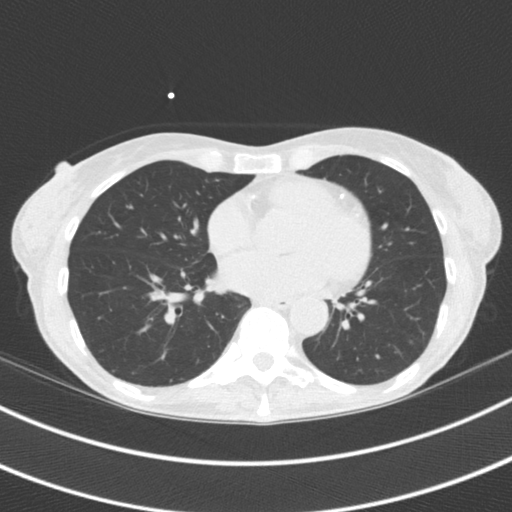
[im 32/48  lung]
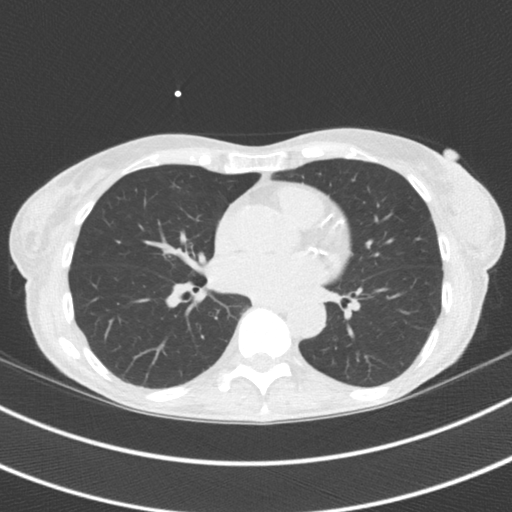
[im 37/48  lung]
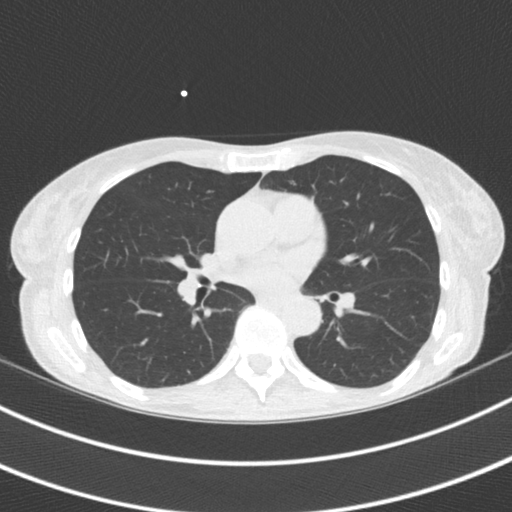

[15 of 20 positions shown; findings below may reference images not displayed]

FINDINGS: Aortic atherosclerosis. Multiple tiny 1-2 mm pulmonary nodules
scattered throughout the visualize lungs, nonspecific, but favored
to reflect benign lesions, predominantly areas of mucoid impaction
within terminal bronchioles. Within the visualized portions of the
thorax there are no other larger more suspicious appearing pulmonary
nodules or masses, there is no acute consolidative airspace disease,
no pleural effusions, no pneumothorax and no lymphadenopathy.
Visualized portions of the upper abdomen are unremarkable. There are
no aggressive appearing lytic or blastic lesions noted in the
visualized portions of the skeleton.
IMPRESSION: 1.  Aortic Atherosclerosis (MKHEK-299.9).
2. No follow-up needed if patient is low-risk (and has no known or
suspected primary neoplasm). Non-contrast chest CT can be considered
in 12 months if patient is high-risk. This recommendation follows
the consensus statement: Guidelines for Management of Incidental
Pulmonary Nodules Detected on CT Images: From the [REDACTED]AL DATA:  Risk stratification

EXAM:
Coronary Calcium Score
FINDINGS: Non-cardiac: See separate report from [REDACTED].

Ascending Aorta: Normal caliber.  Calcifications present.

Pericardium: Normal

Coronary arteries: Normal coronary origins. Coronary calcifications
in all 3 arteries.
IMPRESSION: Coronary calcium score of 1111. This was 97th percentile for age and
sex matched control.

Recommend aggressive risk factor modification.

Consider noninvasive workup with nuclear stress test for risk
stratification.

Dulcilio Larkin

*** End of Addendum ***
EXAM:
OVER-READ INTERPRETATION  CT CHEST

The following report is an over-read performed by radiologist Dr.
Madelinne Tauro [REDACTED] on 06/25/2019. This
over-read does not include interpretation of cardiac or coronary
anatomy or pathology. The coronary calcium score interpretation by
the cardiologist is attached.
FINDINGS: Aortic atherosclerosis. Multiple tiny 1-2 mm pulmonary nodules
scattered throughout the visualize lungs, nonspecific, but favored
to reflect benign lesions, predominantly areas of mucoid impaction
within terminal bronchioles. Within the visualized portions of the
thorax there are no other larger more suspicious appearing pulmonary
nodules or masses, there is no acute consolidative airspace disease,
no pleural effusions, no pneumothorax and no lymphadenopathy.
Visualized portions of the upper abdomen are unremarkable. There are
no aggressive appearing lytic or blastic lesions noted in the
visualized portions of the skeleton.
IMPRESSION: 1.  Aortic Atherosclerosis (MKHEK-299.9).
2. No follow-up needed if patient is low-risk (and has no known or
suspected primary neoplasm). Non-contrast chest CT can be considered
in 12 months if patient is high-risk. This recommendation follows
the consensus statement: Guidelines for Management of Incidental
Pulmonary Nodules Detected on CT Images: From the [HOSPITAL]

## 2020-10-27 ENCOUNTER — Ambulatory Visit: Payer: Medicare PPO | Admitting: Nurse Practitioner

## 2020-10-27 ENCOUNTER — Other Ambulatory Visit: Payer: Self-pay

## 2020-10-27 ENCOUNTER — Encounter: Payer: Self-pay | Admitting: Nurse Practitioner

## 2020-10-27 ENCOUNTER — Other Ambulatory Visit: Payer: Self-pay | Admitting: Obstetrics and Gynecology

## 2020-10-27 VITALS — BP 130/92 | Temp 97.6°F | Wt 99.2 lb

## 2020-10-27 DIAGNOSIS — N3001 Acute cystitis with hematuria: Secondary | ICD-10-CM

## 2020-10-27 DIAGNOSIS — R300 Dysuria: Secondary | ICD-10-CM

## 2020-10-27 DIAGNOSIS — N898 Other specified noninflammatory disorders of vagina: Secondary | ICD-10-CM

## 2020-10-27 LAB — WET PREP FOR TRICH, YEAST, CLUE

## 2020-10-27 MED ORDER — SULFAMETHOXAZOLE-TRIMETHOPRIM 800-160 MG PO TABS: 1.0000 | ORAL_TABLET | Freq: Two times a day (BID) | ORAL | 0 refills | Status: DC

## 2020-10-27 NOTE — Patient Instructions (Addendum)
Azo Estradiol vaginal cream   Urinary Tract Infection, Adult A urinary tract infection (UTI) is an infection of any part of the urinary tract. The urinary tract includes:  The kidneys.  The ureters.  The bladder.  The urethra. These organs make, store, and get rid of pee (urine) in the body. What are the causes? This infection is caused by germs (bacteria) in your genital area. These germs grow and cause swelling (inflammation) of your urinary tract. What increases the risk? The following factors may make you more likely to develop this condition:  Using a small, thin tube (catheter) to drain pee.  Not being able to control when you pee or poop (incontinence).  Being female. If you are female, these things can increase the risk: ? Using these methods to prevent pregnancy:  A medicine that kills sperm (spermicide).  A device that blocks sperm (diaphragm). ? Having low levels of a female hormone (estrogen). ? Being pregnant. You are more likely to develop this condition if:  You have genes that add to your risk.  You are sexually active.  You take antibiotic medicines.  You have trouble peeing because of: ? A prostate that is bigger than normal, if you are female. ? A blockage in the part of your body that drains pee from the bladder. ? A kidney stone. ? A nerve condition that affects your bladder. ? Not getting enough to drink. ? Not peeing often enough.  You have other conditions, such as: ? Diabetes. ? A weak disease-fighting system (immune system). ? Sickle cell disease. ? Gout. ? Injury of the spine. What are the signs or symptoms? Symptoms of this condition include:  Needing to pee right away.  Peeing small amounts often.  Pain or burning when peeing.  Blood in the pee.  Pee that smells bad or not like normal.  Trouble peeing.  Pee that is cloudy.  Fluid coming from the vagina, if you are female.  Pain in the belly or lower back. Other  symptoms include:  Vomiting.  Not feeling hungry.  Feeling mixed up (confused). This may be the first symptom in older adults.  Being tired and grouchy (irritable).  A fever.  Watery poop (diarrhea). How is this treated?  Taking antibiotic medicine.  Taking other medicines.  Drinking enough water. In some cases, you may need to see a specialist. Follow these instructions at home: Medicines  Take over-the-counter and prescription medicines only as told by your doctor.  If you were prescribed an antibiotic medicine, take it as told by your doctor. Do not stop taking it even if you start to feel better. General instructions  Make sure you: ? Pee until your bladder is empty. ? Do not hold pee for a long time. ? Empty your bladder after sex. ? Wipe from front to back after peeing or pooping if you are a female. Use each tissue one time when you wipe.  Drink enough fluid to keep your pee pale yellow.  Keep all follow-up visits.   Contact a doctor if:  You do not get better after 1-2 days.  Your symptoms go away and then come back. Get help right away if:  You have very bad back pain.  You have very bad pain in your lower belly.  You have a fever.  You have chills.  You feeling like you will vomit or you vomit. Summary  A urinary tract infection (UTI) is an infection of any part of the urinary tract.  This condition is caused by germs in your genital area.  There are many risk factors for a UTI.  Treatment includes antibiotic medicines.  Drink enough fluid to keep your pee pale yellow. This information is not intended to replace advice given to you by your health care provider. Make sure you discuss any questions you have with your health care provider. Document Revised: 05/14/2020 Document Reviewed: 05/14/2020 Elsevier Patient Education  [place].

## 2020-10-27 NOTE — Progress Notes (Signed)
   Acute Office Visit  Subjective:    Patient ID: Brittany Mathis, female    DOB: 1946-01-09, 75 y.o.   MRN: [id number]   HPI 75 y.o. presents today for dysuria/urethral spasms and an episode of night sweats that started last night. She took pyridium with some relief. She also complains of vaginal irritation, dryness, and dyspareunia. TVH BSO at age 76. She was on ERT until recently when recommended to discontinue by cardiology. Since stopping she has noticed more vaginal symptoms and more frequent UTIs. Most recent UTI was February/March 2021.    Review of Systems  Constitutional: Positive for diaphoresis (x 1).  Genitourinary: Positive for dysuria and vaginal pain (with intercourse). Negative for frequency, hematuria, urgency and vaginal discharge.       Objective:    Physical Exam Constitutional:      Appearance: Normal appearance.  Abdominal:     Tenderness: There is no right CVA tenderness or left CVA tenderness.  Genitourinary:    General: Normal vulva.     Vagina: Normal.     Uterus: Absent.      BP (!) 130/92   Temp 97.6 F (36.4 C) (Oral)   Wt 99 lb 4 oz (45 kg)   BMI 17.04 kg/m  Wt Readings from Last 3 Encounters:  10/27/20 99 lb 4 oz (45 kg)  05/11/20 98 lb (44.5 kg)  05/10/20 98 lb (44.5 kg)   UA trace leukocytes, + nitrites, 1+ protein, wbc 6-10, rbc 3-10 Wet prep negative     Assessment & Plan:   Problem List Items Addressed This Visit   None   Visit Diagnoses    Acute cystitis with hematuria    -  Primary   Relevant Medications   sulfamethoxazole-trimethoprim (BACTRIM DS) 800-160 MG tablet   Dysuria       Relevant Orders   Urinalysis w microscopic + reflex cultur   Vaginal itching       Relevant Orders   WET PREP FOR TRICH, YEAST, CLUE      Plan: Bactrim 800-160 mg twice daily x 5 days. Increase water intake. OTC Azo as needed but do not exceed 2 days of use. Wet prep unremarkable. Vaginal symptoms most likely related to atrophic changes. We  discussed management with OTC lubricants versus estradiol vaginal cream. She has tried lubricants and is considering estradiol vaginal cream and will notify us if she would like a prescription for this. She will return to office if symptoms worsen or do not improve. She is agreeable to plan.     Olivia Mackie [facility], 2:41 PM 10/27/2020

## 2020-10-30 LAB — URINE CULTURE
MICRO NUMBER:: 11409717
SPECIMEN QUALITY:: ADEQUATE

## 2020-10-30 LAB — URINALYSIS W MICROSCOPIC + REFLEX CULTURE
Bilirubin Urine: NEGATIVE
Hyaline Cast: NONE SEEN /LPF
Ketones, ur: NEGATIVE
Nitrites, Initial: POSITIVE — AB
Specific Gravity, Urine: 1.01 (ref 1.001–1.03)
pH: 5.5 (ref 5.0–8.0)

## 2020-10-30 LAB — CULTURE INDICATED

## 2020-11-01 ENCOUNTER — Other Ambulatory Visit: Payer: Self-pay | Admitting: Nurse Practitioner

## 2020-11-01 DIAGNOSIS — N3001 Acute cystitis with hematuria: Secondary | ICD-10-CM

## 2020-11-01 MED ORDER — AMOXICILLIN-POT CLAVULANATE 875-125 MG PO TABS: 1.0000 | ORAL_TABLET | Freq: Two times a day (BID) | ORAL | 0 refills | Status: AC

## 2020-11-05 ENCOUNTER — Telehealth: Payer: Self-pay | Admitting: *Deleted

## 2020-11-05 NOTE — Telephone Encounter (Signed)
Patient called back and informed with Tiffany message below:  Hi Jezabel, Your urine culture came back positive for two organisms. The antibiotic you were taking treats only one of them, so we will need to start Augmentin. I will send this to your pharmacy. Please let me know that you received this message. Stay safe!  Tiffany

## 2020-11-08 ENCOUNTER — Other Ambulatory Visit: Payer: Self-pay

## 2020-11-08 MED ORDER — ROSUVASTATIN CALCIUM 20 MG PO TABS: 20.0000 mg | ORAL_TABLET | Freq: Every day | ORAL | 0 refills | Status: DC

## 2020-11-08 NOTE — Telephone Encounter (Signed)
Pt's medication was sent to pt's pharmacy as requested. Confirmation received.  °

## 2020-11-10 ENCOUNTER — Other Ambulatory Visit: Payer: Self-pay

## 2020-11-11 ENCOUNTER — Ambulatory Visit: Payer: Medicare PPO | Admitting: Internal Medicine

## 2020-11-11 ENCOUNTER — Encounter: Payer: Self-pay | Admitting: Internal Medicine

## 2020-11-11 DIAGNOSIS — R931 Abnormal findings on diagnostic imaging of heart and coronary circulation: Secondary | ICD-10-CM

## 2020-11-11 DIAGNOSIS — E785 Hyperlipidemia, unspecified: Secondary | ICD-10-CM

## 2020-11-11 DIAGNOSIS — R634 Abnormal weight loss: Secondary | ICD-10-CM

## 2020-11-11 DIAGNOSIS — I700 Atherosclerosis of aorta: Secondary | ICD-10-CM

## 2020-11-11 DIAGNOSIS — I10 Essential (primary) hypertension: Secondary | ICD-10-CM | POA: Diagnosis not present

## 2020-11-11 LAB — CBC WITH DIFFERENTIAL/PLATELET
Basophils Absolute: 0.1 10*3/uL (ref 0.0–0.1)
Basophils Relative: 1 % (ref 0.0–3.0)
Eosinophils Absolute: 0.2 10*3/uL (ref 0.0–0.7)
Eosinophils Relative: 3.7 % (ref 0.0–5.0)
HCT: 39.3 % (ref 36.0–46.0)
Hemoglobin: 13.3 g/dL (ref 12.0–15.0)
Lymphocytes Relative: 40.2 % (ref 12.0–46.0)
Lymphs Abs: 2.1 10*3/uL (ref 0.7–4.0)
MCHC: 33.8 g/dL (ref 30.0–36.0)
MCV: 88.8 fl (ref 78.0–100.0)
Monocytes Absolute: 0.6 10*3/uL (ref 0.1–1.0)
Monocytes Relative: 10.5 % (ref 3.0–12.0)
Neutro Abs: 2.4 10*3/uL (ref 1.4–7.7)
Neutrophils Relative %: 44.6 % (ref 43.0–77.0)
Platelets: 243 10*3/uL (ref 150.0–400.0)
RBC: 4.43 Mil/uL (ref 3.87–5.11)
RDW: 12.3 % (ref 11.5–15.5)
WBC: 5.4 10*3/uL (ref 4.0–10.5)

## 2020-11-11 LAB — COMPREHENSIVE METABOLIC PANEL
ALT: 38 U/L — ABNORMAL HIGH (ref 0–35)
AST: 39 U/L — ABNORMAL HIGH (ref 0–37)
Albumin: 4.7 g/dL (ref 3.5–5.2)
Alkaline Phosphatase: 40 U/L (ref 39–117)
BUN: 15 mg/dL (ref 6–23)
CO2: 30 mEq/L (ref 19–32)
Calcium: 10 mg/dL (ref 8.4–10.5)
Chloride: 97 mEq/L (ref 96–112)
Creatinine, Ser: 0.61 mg/dL (ref 0.40–1.20)
GFR: 87.03 mL/min (ref >60.00)
Glucose, Bld: 91 mg/dL (ref 70–99)
Potassium: 5.7 mEq/L — ABNORMAL HIGH (ref 3.5–5.1)
Sodium: 131 mEq/L — ABNORMAL LOW (ref 135–145)
Total Bilirubin: 0.5 mg/dL (ref 0.2–1.2)
Total Protein: 7.7 g/dL (ref 6.0–8.3)

## 2020-11-11 LAB — LIPID PANEL
Cholesterol: 199 mg/dL (ref 0–200)
HDL: 99.4 mg/dL (ref >39.00)
LDL Cholesterol: 89 mg/dL (ref 0–99)
NonHDL: 99.38
Total CHOL/HDL Ratio: 2
Triglycerides: 53 mg/dL (ref 0.0–149.0)
VLDL: 10.6 mg/dL (ref 0.0–40.0)

## 2020-11-11 LAB — URINALYSIS
Bilirubin Urine: NEGATIVE
Hgb urine dipstick: NEGATIVE
Ketones, ur: 15 — AB
Leukocytes,Ua: NEGATIVE
Nitrite: NEGATIVE
Specific Gravity, Urine: 1.02 (ref 1.000–1.030)
Total Protein, Urine: NEGATIVE
Urine Glucose: NEGATIVE
Urobilinogen, UA: 0.2 (ref 0.0–1.0)
pH: 6 (ref 5.0–8.0)

## 2020-11-11 LAB — TSH: TSH: 1.85 u[IU]/mL (ref 0.35–4.50)

## 2020-11-11 NOTE — Assessment & Plan Note (Signed)
On Simvastatin 

## 2020-11-11 NOTE — Assessment & Plan Note (Signed)
Simvastatin and aspirin

## 2020-11-11 NOTE — Progress Notes (Signed)
Subjective:  Patient ID: Brittany Mathis, female    DOB: September 03, 1946  Age: 75 y.o. MRN: [id number]  CC: Follow-up (6 month f/u)   HPI Brittany Mathis presents for HTN, dyslipidemia, CAD wt loss f/u  Outpatient Medications Prior to Visit  Medication Sig Dispense Refill   alendronate (FOSAMAX) 70 MG tablet Take 1 tablet (70 mg total) by mouth every Tuesday. Take with a full glass of water on an empty stomach. 12 tablet 3   aspirin EC 81 MG tablet Take 81 mg by mouth daily with lunch.      Cholecalciferol (VITAMIN D) 50 MCG (2000 UT) tablet Take 2,000 Units by mouth daily with lunch.     metoprolol succinate (TOPROL-XL) 25 MG 24 hr tablet TAKE 1 TABLET(25 MG) BY MOUTH DAILY 30 tablet 4   Propylene Glycol (SYSTANE COMPLETE) 0.6 % SOLN Place 1 drop into both eyes daily.     rosuvastatin (CRESTOR) 20 MG tablet Take 1 tablet (20 mg total) by mouth daily. Please keep upcoming appt in February 2022 with Dr. Mayford Knife for future refills. Thank you 90 tablet 0   valsartan (DIOVAN) 80 MG tablet Take 1 tablet (80 mg total) by mouth daily. 90 tablet 3   nitroGLYCERIN (NITROSTAT) 0.4 MG SL tablet Place 1 tablet (0.4 mg total) under the tongue every 5 (five) minutes as needed for chest pain. 25 tablet 3   metroNIDAZOLE (METROCREAM) 0.75 % cream Apply topically 2 (two) times daily. (Patient not taking: Reported on 11/11/2020)     metroNIDAZOLE (METROGEL) 0.75 % gel  (Patient not taking: Reported on 11/11/2020)     No facility-administered medications prior to visit.    ROS: Review of Systems  Constitutional: Positive for unexpected weight change. Negative for activity change, appetite change, chills and fatigue.  HENT: Negative for congestion, mouth sores and sinus pressure.   Eyes: Negative for visual disturbance.  Respiratory: Negative for cough and chest tightness.   Gastrointestinal: Negative for abdominal pain and nausea.  Genitourinary: Negative for difficulty urinating, frequency and vaginal  pain.  Musculoskeletal: Negative for back pain and gait problem.  Skin: Negative for pallor and rash.  Neurological: Negative for dizziness, tremors, weakness, numbness and headaches.  Psychiatric/Behavioral: Negative for confusion and sleep disturbance.    Objective:  BP 130/84 (BP Location: Left Arm)    Pulse 67    Temp 97.7 F (36.5 C) (Oral)    Ht 5\' 4"  (1.626 m)    Wt 95 lb 12.8 oz (43.5 kg)    SpO2 97%    BMI 16.44 kg/m   BP Readings from Last 3 Encounters:  11/11/20 130/84  10/27/20 (!) 130/92  05/11/20 (!) 138/86    Wt Readings from Last 3 Encounters:  11/11/20 95 lb 12.8 oz (43.5 kg)  10/27/20 99 lb 4 oz (45 kg)  05/11/20 98 lb (44.5 kg)    Physical Exam Constitutional:      General: She is not in acute distress.    Appearance: She is well-developed.  HENT:     Head: Normocephalic.     Right Ear: External ear normal.     Left Ear: External ear normal.     Nose: Nose normal.     Mouth/Throat:     Mouth: Oropharynx is clear and moist.  Eyes:     General:        Right eye: No discharge.        Left eye: No discharge.     Conjunctiva/sclera: Conjunctivae normal.  Pupils: Pupils are equal, round, and reactive to light.  Neck:     Thyroid: No thyromegaly.     Vascular: No JVD.     Trachea: No tracheal deviation.  Cardiovascular:     Rate and Rhythm: Normal rate and regular rhythm.     Heart sounds: Normal heart sounds.  Pulmonary:     Effort: No respiratory distress.     Breath sounds: No stridor. No wheezing.  Abdominal:     General: Bowel sounds are normal. There is no distension.     Palpations: Abdomen is soft. There is no mass.     Tenderness: There is no abdominal tenderness. There is no guarding or rebound.  Musculoskeletal:        General: No tenderness or edema.     Cervical back: Normal range of motion and neck supple.  Lymphadenopathy:     Cervical: No cervical adenopathy.  Skin:    Findings: No erythema or rash.  Neurological:      Mental Status: She is oriented to person, place, and time.     Cranial Nerves: No cranial nerve deficit.     Motor: No abnormal muscle tone.     Coordination: Coordination normal.     Deep Tendon Reflexes: Reflexes normal.  Psychiatric:        Mood and Affect: Mood and affect normal.        Behavior: Behavior normal.        Thought Content: Thought content normal.        Judgment: Judgment normal.    Thin   Lab Results  Component Value Date   WBC 5.8 09/03/2019   HGB 12.5 09/03/2019   HCT 35.8 09/03/2019   PLT 222 09/03/2019   GLUCOSE 95 09/03/2019   CHOL 177 12/22/2019   TRIG 58 12/22/2019   HDL 96 12/22/2019   LDLDIRECT 109.2 02/05/2012   LDLCALC 70 12/22/2019   ALT 34 (H) 01/20/2020   AST 42 (H) 01/20/2020   NA 133 (L) 09/03/2019   K 5.0 09/03/2019   CL 99 09/03/2019   CREATININE 0.57 09/03/2019   BUN 15 09/03/2019   CO2 27 09/03/2019   TSH 2.02 05/06/2019    VAS US CAROTID  Result Date: 11/19/2019 Carotid Arterial Duplex Study Indications:  Bilateral bruits. Patient denies any cerebrovascular symptoms, but               says she does have a strong family history of cardiac diseaes and               an elevated coronary calcium score. Risk Factors: Hypertension, hyperlipidemia, no history of smoking. Performing Technologist: Olegario Shearer RVT  Examination Guidelines: A complete evaluation includes B-mode imaging, spectral Doppler, color Doppler, and power Doppler as needed of all accessible portions of each vessel. Bilateral testing is considered an integral part of a complete examination. Limited examinations for reoccurring indications may be performed as noted.  Right Carotid Findings: +----------+--------+--------+--------+------------------+--------+             PSV cm/s EDV cm/s Stenosis Plaque Description Comments  +----------+--------+--------+--------+------------------+--------+  CCA Prox   74       15                                              +----------+--------+--------+--------+------------------+--------+  CCA Distal 61  13                                             +----------+--------+--------+--------+------------------+--------+  ICA Prox   54       14       1-39%    heterogenous                 +----------+--------+--------+--------+------------------+--------+  ICA Mid    52       18                                             +----------+--------+--------+--------+------------------+--------+  ICA Distal 108      30                                             +----------+--------+--------+--------+------------------+--------+  ECA        48       7                                              +----------+--------+--------+--------+------------------+--------+ +----------+--------+-------+----------------+-------------------+             PSV cm/s EDV cms Describe         Arm Pressure (mmHG)  +----------+--------+-------+----------------+-------------------+  Subclavian 114              Multiphasic, WNL 138                  +----------+--------+-------+----------------+-------------------+ +---------+--------+--+--------+--+---------+  Vertebral PSV cm/s 70 EDV cm/s 17 Antegrade  +---------+--------+--+--------+--+---------+  Left Carotid Findings: +----------+--------+--------+--------+------------------+--------+             PSV cm/s EDV cm/s Stenosis Plaque Description Comments  +----------+--------+--------+--------+------------------+--------+  CCA Prox   60       14                                             +----------+--------+--------+--------+------------------+--------+  CCA Distal 54       16                                             +----------+--------+--------+--------+------------------+--------+  ICA Prox   60       18                heterogenous                 +----------+--------+--------+--------+------------------+--------+  ICA Mid    87       26       1-39%                                  +----------+--------+--------+--------+------------------+--------+  ICA Distal 89       34                                             +----------+--------+--------+--------+------------------+--------+  ECA        63       5                                              +----------+--------+--------+--------+------------------+--------+ +----------+--------+--------+----------------+-------------------+             PSV cm/s EDV cm/s Describe         Arm Pressure (mmHG)  +----------+--------+--------+----------------+-------------------+  Subclavian 125               Multiphasic, WNL 140                  +----------+--------+--------+----------------+-------------------+ +---------+--------+--+--------+-+---------+  Vertebral PSV cm/s 23 EDV cm/s 7 Antegrade  +---------+--------+--+--------+-+---------+   Summary: Right Carotid: Velocities in the right ICA are consistent with a 1-39% stenosis. Left Carotid: Velocities in the left ICA are consistent with a 1-39% stenosis. Vertebrals:  Bilateral vertebral arteries demonstrate antegrade flow. Subclavians: Normal flow hemodynamics were seen in bilateral subclavian              arteries. *See table(s) above for measurements and observations.  Electronically signed by Lance Muss MD on 11/19/2019 at 11:50:52 AM.    Final     Assessment & Plan:    Sonda Primes, MD

## 2020-11-11 NOTE — Addendum Note (Signed)
Addended by: Ebony Cargo on: 11/11/2020 10:24 AM   Modules accepted: Orders

## 2020-11-11 NOTE — Assessment & Plan Note (Signed)
Valsartan, Toprol 

## 2020-11-11 NOTE — Assessment & Plan Note (Signed)
Discussed - wt is up and down

## 2020-11-16 ENCOUNTER — Other Ambulatory Visit: Payer: Self-pay | Admitting: Internal Medicine

## 2020-11-16 DIAGNOSIS — I10 Essential (primary) hypertension: Secondary | ICD-10-CM

## 2020-11-30 ENCOUNTER — Ambulatory Visit (INDEPENDENT_AMBULATORY_CARE_PROVIDER_SITE_OTHER): Payer: Medicare PPO | Admitting: Cardiology

## 2020-11-30 ENCOUNTER — Encounter: Payer: Self-pay | Admitting: Cardiology

## 2020-11-30 ENCOUNTER — Other Ambulatory Visit: Payer: Self-pay

## 2020-11-30 VITALS — BP 152/68 | HR 55 | Ht 64.0 in | Wt 98.0 lb

## 2020-11-30 DIAGNOSIS — I10 Essential (primary) hypertension: Secondary | ICD-10-CM

## 2020-11-30 DIAGNOSIS — I2510 Atherosclerotic heart disease of native coronary artery without angina pectoris: Secondary | ICD-10-CM

## 2020-11-30 DIAGNOSIS — E785 Hyperlipidemia, unspecified: Secondary | ICD-10-CM

## 2020-11-30 MED ORDER — VALSARTAN 160 MG PO TABS: 160.0000 mg | ORAL_TABLET | Freq: Every day | ORAL | 3 refills | Status: DC

## 2020-11-30 MED ORDER — ROSUVASTATIN CALCIUM 40 MG PO TABS: 40.0000 mg | ORAL_TABLET | Freq: Every day | ORAL | 3 refills | Status: DC

## 2020-11-30 NOTE — Addendum Note (Signed)
Addended by: [name] on: 11/30/2020 10:05 AM   Modules accepted: Orders

## 2020-11-30 NOTE — Progress Notes (Addendum)
Cardiology Office Note:    Date:  11/30/2020   ID:  SANIKA BROSIOUS, DOB 1946/02/27, MRN [id number]  PCP:  Tresa Garter, MD  Cardiologist:  Armanda Magic, MD    Referring MD: Tresa Garter, MD   Chief Complaint  Patient presents with  . Hypertension  . Coronary Artery Disease  . Hyperlipidemia    History of Present Illness:    Brittany Mathis is a 75 y.o. female with a hx of HTN and HLD who underwent risk factor assessment for CAD with coronary artery calcium screening and was found to have a calcium score of 1431. She has  a strong family hx of CAD with her father having an MI in his 84's.  Her mom had a CVA.  She had CRF besides her fm hx including HTN and HLD.   She underwent cardiac cath in 2020 for CP showing 70% RPAV, 25% RCA, 70% pLAD, 90% mLAD, 90% D1 and normal LVF.  Aggressive medical therapy was recommended.  It was recommended at the time of cath to pursue CABG for refractory angina as she was not a candidate for PCI due to complexity of her disease.   She is here today for followup and is doing well.  She denies any chest pain or pressure, SOB, DOE, PND, orthopnea, LE edema, dizziness, palpitations or syncope. She walks 2 miles daily with no problems.  She is compliant with her meds and is tolerating meds with no SE.    Past Medical History:  Diagnosis Date  . Carotid artery stenosis    1-39% bilateral by dopplers 11/2019  . Cataract   . History of shingles   . Hyperlipidemia   . Hypertension   . Nephrolithiasis 1972  . Osteoporosis 05/2017   T score -2.7  . Thyroid disease 2010   thyroiditis    Past Surgical History:  Procedure Laterality Date  . ABDOMINAL HYSTERECTOMY  1983   with BSO  menorrhagia, endometriosis  . cataracts Bilateral 2018  . LEFT HEART CATH AND CORONARY ANGIOGRAPHY N/A 09/05/2019   Procedure: LEFT HEART CATH AND CORONARY ANGIOGRAPHY;  Surgeon: [place], Peter M, MD;  Location: [facility] INVASIVE CV LAB;  Service: Cardiovascular;  Laterality:  N/A;  . OOPHORECTOMY     BSO    Current Medications: Current Meds  Medication Sig  . alendronate (FOSAMAX) 70 MG tablet Take 1 tablet (70 mg total) by mouth every Tuesday. Take with a full glass of water on an empty stomach.  [name] aspirin EC 81 MG tablet Take 81 mg by mouth daily with lunch.   . Cholecalciferol (VITAMIN D) 50 MCG (2000 UT) tablet Take 2,000 Units by mouth daily with lunch.  . metoprolol succinate (TOPROL-XL) 25 MG 24 hr tablet TAKE 1 TABLET(25 MG) BY MOUTH DAILY  . nitroGLYCERIN (NITROSTAT) 0.4 MG SL tablet Place 1 tablet (0.4 mg total) under the tongue every 5 (five) minutes as needed for chest pain.  [name] Propylene Glycol (SYSTANE COMPLETE) 0.6 % SOLN Place 1 drop into both eyes daily.  . rosuvastatin (CRESTOR) 20 MG tablet Take 1 tablet (20 mg total) by mouth daily. Please keep upcoming appt in February 2022 with Dr. Mayford Knife for future refills. Thank you  . valsartan (DIOVAN) 80 MG tablet Take 1 tablet (80 mg total) by mouth daily.     Allergies:   Patient has no known allergies.   Social History   Socioeconomic History  . Marital status: Married    Spouse name: Not on  file  . Number of children: Not on file  . Years of education: Not on file  . Highest education level: Not on file  Occupational History  . Not on file  Tobacco Use  . Smoking status: Never Smoker  . Smokeless tobacco: Never Used  . Tobacco comment: experimented in [employer], [employer]  . Vaping Use: Never used  Substance and Sexual Activity  . Alcohol use: Yes    Alcohol/week: 14.0 standard drinks    Types: 14 Standard drinks or equivalent per week  . Drug use: No  . Sexual activity: Not Currently    Birth control/protection: Surgical    Comment: HYST-1st intercourse 21 yo-1 partner  Other Topics Concern  . Not on file  Social History Narrative  . Not on file   Social Determinants of Health   Financial Resource Strain: Not on file  Food Insecurity: Not on file  Transportation Needs: Not  on file  Physical Activity: Not on file  Stress: Not on file  Social Connections: Not on file     Family History: The patient's family history includes Colon cancer in her maternal aunt; Glaucoma in her father; Hyperlipidemia in her father and mother; Hypertension in her father and mother; Kidney disease in her father; Stroke in her mother.  ROS:   Please see the history of present illness.    ROS  All other systems reviewed and negative.   EKGs/Labs/Other Studies Reviewed:    The following studies were reviewed today: Cardiac cath, labs from PCP  EKG:  EKG is ordered today and showed sinus bradycardia at 55bpm with septal infarct and iRBBB  Recent Labs: 11/11/2020: ALT 38; BUN 15; Creatinine, Ser 0.61; Hemoglobin 13.3; Platelets 243.0; Potassium 5.7 No hemolysis seen; Sodium 131; TSH 1.85   Recent Lipid Panel    Component Value Date/Time   CHOL 199 11/11/2020 1024   CHOL 177 12/22/2019 0824   TRIG 53.0 11/11/2020 1024   HDL 99.40 11/11/2020 1024   HDL 96 12/22/2019 0824   CHOLHDL 2 11/11/2020 1024   VLDL 10.6 11/11/2020 1024   LDLCALC 89 11/11/2020 1024   LDLCALC 70 12/22/2019 0824   LDLDIRECT 109.2 02/05/2012 0905    Physical Exam:    VS:  BP (!) 152/68   Pulse (!) 55   Ht 5\' 4"  (1.626 m)   Wt 98 lb (44.5 kg)   SpO2 100%   BMI 16.82 kg/m     Wt Readings from Last 3 Encounters:  11/30/20 98 lb (44.5 kg)  11/11/20 95 lb 12.8 oz (43.5 kg)  10/27/20 99 lb 4 oz (45 kg)     GEN: Well nourished, well developed in no acute distress HEENT: Normal NECK: No JVD; No carotid bruits LYMPHATICS: No lymphadenopathy CARDIAC:RRR, no murmurs, rubs, gallops RESPIRATORY:  Clear to auscultation without rales, wheezing or rhonchi  ABDOMEN: Soft, non-tender, non-distended MUSCULOSKELETAL:  No edema; No deformity  SKIN: Warm and dry NEUROLOGIC:  Alert and oriented x 3 PSYCHIATRIC:  Normal affect    ASSESSMENT:    1. Coronary artery disease involving native coronary  artery of native heart without angina pectoris   2. Essential hypertension   3. Hyperlipidemia LDL goal <70    PLAN:    In order of problems listed above:  1.  ASCAD -cath showing 70% RPAV, 25% RCA, 70% pLAD, 90% mLAD, 90% D1 -It was recommended at the time of cath to pursue CABG for refractory angina as she was not a candidate for PCI  due to complexity of her disease.  -she denies any anginal sx on her current medical therapy -continue ASA 81mg  daily, statin and BB  2.  HTN -Bp is borderline controlled on exam today but has not taken her BP meds today -at home her BP runs 140/55mmHg -I would like to see her BP 130/85mmHg -continue Toprol XL 25mg  daily  -increase Valsartan to 160mg  daily -check BMET in 1 week -I have asked her to check her BP daily for a week and call with results  3.  HLD -LDL goal < 70 -LDL 89 in Jan 2021 -increase Crestor to 40mg  daily -repeat FLP and ALT in 6 weeks  Followup with me in 1 year  Medication Adjustments/Labs and Tests Ordered: Current medicines are reviewed at length with the patient today.  Concerns regarding medicines are outlined above.  Orders Placed This Encounter  Procedures  . EKG 12-Lead   No orders of the defined types were placed in this encounter.   Signed, , MD  11/30/2020 9:41 AM    [facility]

## 2020-11-30 NOTE — Patient Instructions (Addendum)
Medication Instructions:  Your physician has recommended you make the following change in your medication: 1) INCREASE Valsartan to 160 mg daily  2) INCREASE Crestor (rosuvastatin) to 40 mg daily   *If you need a refill on your cardiac medications before your next appointment, please call your pharmacy*  Lab Work: BMET in one week (2/22 between 7:30 am and 4:30 pm) Fasting lipids and ALT in 6 weeks (3/29 between 7:30am and 4:30 pm) If you have labs (blood work) drawn today and your tests are completely normal, you will receive your results only by: [name] MyChart Message (if you have MyChart) OR . A paper copy in the mail If you have any lab test that is abnormal or we need to change your treatment, we will call you to review the results.  Follow-Up: At [facility], you and your health needs are our priority.  As part of our continuing mission to provide you with exceptional heart care, we have created designated Provider Care Teams.  These Care Teams include your primary Cardiologist (physician) and Advanced Practice Providers (APPs -  Physician Assistants and Nurse Practitioners) who all work together to provide you with the care you need, when you need it.  Your next appointment:   1 year(s)  The format for your next appointment:   In Person  Provider:   You may see Armanda Magic, MD or one of the following Advanced Practice Providers on your designated Care Team:    Ronie Spies, PA-C  Jacolyn Reedy, PA-C

## 2020-12-07 ENCOUNTER — Other Ambulatory Visit: Payer: Self-pay

## 2020-12-07 ENCOUNTER — Other Ambulatory Visit: Payer: Medicare PPO | Admitting: *Deleted

## 2020-12-07 DIAGNOSIS — E785 Hyperlipidemia, unspecified: Secondary | ICD-10-CM

## 2020-12-07 DIAGNOSIS — I2510 Atherosclerotic heart disease of native coronary artery without angina pectoris: Secondary | ICD-10-CM

## 2020-12-07 DIAGNOSIS — I10 Essential (primary) hypertension: Secondary | ICD-10-CM | POA: Diagnosis not present

## 2020-12-07 LAB — BASIC METABOLIC PANEL
BUN/Creatinine Ratio: 22 (ref 12–28)
BUN: 14 mg/dL (ref 8–27)
CO2: 24 mmol/L (ref 20–29)
Calcium: 9.7 mg/dL (ref 8.7–10.3)
Chloride: 95 mmol/L — ABNORMAL LOW (ref 96–106)
Creatinine, Ser: 0.63 mg/dL (ref 0.57–1.00)
GFR calc Af Amer: 101 mL/min/{1.73_m2} (ref >59)
GFR calc non Af Amer: 87 mL/min/{1.73_m2} (ref >59)
Glucose: 67 mg/dL (ref 65–99)
Potassium: 4.7 mmol/L (ref 3.5–5.2)
Sodium: 134 mmol/L (ref 134–144)

## 2020-12-20 ENCOUNTER — Other Ambulatory Visit: Payer: Self-pay

## 2020-12-20 ENCOUNTER — Other Ambulatory Visit (INDEPENDENT_AMBULATORY_CARE_PROVIDER_SITE_OTHER): Payer: Medicare PPO

## 2020-12-20 DIAGNOSIS — I10 Essential (primary) hypertension: Secondary | ICD-10-CM | POA: Diagnosis not present

## 2020-12-20 LAB — COMPREHENSIVE METABOLIC PANEL
ALT: 32 U/L (ref 0–35)
AST: 30 U/L (ref 0–37)
Albumin: 4.4 g/dL (ref 3.5–5.2)
Alkaline Phosphatase: 41 U/L (ref 39–117)
BUN: 13 mg/dL (ref 6–23)
CO2: 29 mEq/L (ref 19–32)
Calcium: 9.5 mg/dL (ref 8.4–10.5)
Chloride: 98 mEq/L (ref 96–112)
Creatinine, Ser: 0.59 mg/dL (ref 0.40–1.20)
GFR: 87.67 mL/min (ref >60.00)
Glucose, Bld: 85 mg/dL (ref 70–99)
Potassium: 5.6 mEq/L — ABNORMAL HIGH (ref 3.5–5.1)
Sodium: 133 mEq/L — ABNORMAL LOW (ref 135–145)
Total Bilirubin: 0.5 mg/dL (ref 0.2–1.2)
Total Protein: 7.2 g/dL (ref 6.0–8.3)

## 2020-12-23 ENCOUNTER — Other Ambulatory Visit: Payer: Self-pay | Admitting: Internal Medicine

## 2020-12-23 DIAGNOSIS — I10 Essential (primary) hypertension: Secondary | ICD-10-CM

## 2021-01-11 ENCOUNTER — Other Ambulatory Visit: Payer: Medicare PPO

## 2021-01-11 ENCOUNTER — Other Ambulatory Visit: Payer: Self-pay

## 2021-01-11 DIAGNOSIS — I10 Essential (primary) hypertension: Secondary | ICD-10-CM

## 2021-01-11 DIAGNOSIS — I2510 Atherosclerotic heart disease of native coronary artery without angina pectoris: Secondary | ICD-10-CM | POA: Diagnosis not present

## 2021-01-11 DIAGNOSIS — E785 Hyperlipidemia, unspecified: Secondary | ICD-10-CM

## 2021-01-11 LAB — LIPID PANEL
Chol/HDL Ratio: 2 ratio (ref 0.0–4.4)
Cholesterol, Total: 215 mg/dL — ABNORMAL HIGH (ref 100–199)
HDL: 109 mg/dL (ref >39)
LDL Chol Calc (NIH): 95 mg/dL (ref 0–99)
Triglycerides: 63 mg/dL (ref 0–149)
VLDL Cholesterol Cal: 11 mg/dL (ref 5–40)

## 2021-01-11 LAB — ALT: ALT: 50 IU/L — ABNORMAL HIGH (ref 0–32)

## 2021-01-12 ENCOUNTER — Telehealth: Payer: Self-pay

## 2021-01-12 ENCOUNTER — Other Ambulatory Visit: Payer: Self-pay | Admitting: Cardiology

## 2021-01-12 DIAGNOSIS — E785 Hyperlipidemia, unspecified: Secondary | ICD-10-CM

## 2021-01-12 NOTE — Telephone Encounter (Signed)
-----   Message from Cheree Ditto, [facility] sent at 01/11/2021  5:17 PM EDT ----- Due to patients's elevated calcium score and CAD, recommend appt with lipid clinic to start PCSK9i.

## 2021-01-12 NOTE — Telephone Encounter (Signed)
Patient notified of lab results via MyChart. Referral has been placed for patient to be seen in lipid clinic.

## 2021-02-01 DIAGNOSIS — I700 Atherosclerosis of aorta: Secondary | ICD-10-CM | POA: Insufficient documentation

## 2021-02-01 NOTE — Assessment & Plan Note (Signed)
On Crestor 

## 2021-02-02 ENCOUNTER — Ambulatory Visit (INDEPENDENT_AMBULATORY_CARE_PROVIDER_SITE_OTHER): Payer: Medicare PPO | Admitting: Pharmacist

## 2021-02-02 ENCOUNTER — Encounter: Payer: Self-pay | Admitting: Pharmacist

## 2021-02-02 ENCOUNTER — Other Ambulatory Visit: Payer: Self-pay

## 2021-02-02 DIAGNOSIS — I200 Unstable angina: Secondary | ICD-10-CM

## 2021-02-02 DIAGNOSIS — I2510 Atherosclerotic heart disease of native coronary artery without angina pectoris: Secondary | ICD-10-CM | POA: Diagnosis not present

## 2021-02-02 DIAGNOSIS — E785 Hyperlipidemia, unspecified: Secondary | ICD-10-CM | POA: Diagnosis not present

## 2021-02-02 MED ORDER — NITROGLYCERIN 0.4 MG SL SUBL: 0.4000 mg | SUBLINGUAL_TABLET | SUBLINGUAL | 3 refills | Status: DC | PRN

## 2021-02-02 NOTE — Patient Instructions (Addendum)
It was nice meeting you today!  We would like your LDL (bad cholesterol) to be less than 70  Please continue your rosuvastatin 40mg  once daily  We will start a new medication called Repatha which you will inject once every 2 weeks  We will recheck your cholesterol in 2-3 months    Please call with any questions!  , PharmD, BCACP, CDCES, CPP [facility] [address], [place], [place] Phone: [phone]; Fax: [phone] 02/02/2021 2:03 PM

## 2021-02-02 NOTE — Progress Notes (Signed)
Patient ID: Brittany Mathis                 DOB: 1946/04/23                    MRN: [id number]     HPI: Brittany Mathis is a 75 y.o. female patient referred to lipid clinic by Dr Mayford Knife. PMH is significant for HTN, CAD, HLD and an elevated calcium score.   Patient presents today in good spiritis.  Eats a very heart healthy diet.  Avoids processed foods and simple sugars.  Exercises daily with husband by walking 2 miles. Follows the Mediterranean diet with red meat once a week or less.  Typically eats lean proteins such as chicken and fish, fresh fruits and vegetables and does not add salt to her foods.  Has 2 glasses of wine per night.  At last visit rosuvastatin was increased to 40mg  daily and she has been tolerating with no adverse effects.  Reports her father had CAD.    Current Medications: rosuvastatin 40mg  daily Intolerances: n/a Risk Factors: CAD, elevated calcium score LDL goal: <70  Diet: Mediterranean diet, fresh fruits and vegetables, potatoes, eggs, chicken and salmon, red meat once a week, 1/2 grapefruit for breakfast with english muffin.  Spinach salad for lunch.  Grilled chicken, broccolini, for dinner.  Does not eat sugar and salt  Exercise:  Walks 2 miles a day with husband between 40 and 80 degrees.  Water, orange juice.   Family History: Father had CAD.    Social History: 2 glasses a day of wine a night.    Labs: TC 215, Trigs 63, HDL 109, LDL 95 on rosuvastatin 40  Coronary calcium score of 1431. This was 97th percentile for age and sex matched control.  Past Medical History:  Diagnosis Date  . Carotid artery stenosis    1-39% bilateral by dopplers 11/2019  . Cataract   . History of shingles   . Hyperlipidemia   . Hypertension   . Nephrolithiasis 1972  . Osteoporosis 05/2017   T score -2.7  . Thyroid disease 2010   thyroiditis    Current Outpatient Medications on File Prior to Visit  Medication Sig Dispense Refill  . alendronate (FOSAMAX) 70 MG tablet Take 1  tablet (70 mg total) by mouth every Tuesday. Take with a full glass of water on an empty stomach. 12 tablet 3  . aspirin EC 81 MG tablet Take 81 mg by mouth daily with lunch.     . Cholecalciferol (VITAMIN D) 50 MCG (2000 UT) tablet Take 2,000 Units by mouth daily with lunch.    . metoprolol succinate (TOPROL-XL) 25 MG 24 hr tablet Take 1 tablet (25 mg total) by mouth daily. 90 tablet 3  . nitroGLYCERIN (NITROSTAT) 0.4 MG SL tablet Place 1 tablet (0.4 mg total) under the tongue every 5 (five) minutes as needed for chest pain. 25 tablet 3  . Propylene Glycol (SYSTANE COMPLETE) 0.6 % SOLN Place 1 drop into both eyes daily.    . rosuvastatin (CRESTOR) 40 MG tablet Take 1 tablet (40 mg total) by mouth daily. 90 tablet 3  . valsartan (DIOVAN) 160 MG tablet Take 1 tablet (160 mg total) by mouth daily. 90 tablet 3   No current facility-administered medications on file prior to visit.    No Known Allergies  Assessment/Plan:  1. Hyperlipidemia - Patient LDL 95 which is above goal of <70.  Could push for more intense goal of <55  due to elevated coronary calcium score.  Patient already follows heart healthy diet and lifestyle so needs further pharmacologic therapy.  Recommended PCSK9i and patient was agreeable.  Using demo pen, taught patient all aspects of Repatha use including storage, site selection, administration, and possible adverse effects.  Patient was able to demonstrate use in room.  Printed patient instructions.  Will complete PA and contact patient once approved.  Lipid panel scheduled for early July.  Continue rosuvastatin 40mg  daily Recheck cholesterol panel in 2-3 months Patient requested nitroglycerin refill due to hers expiring  , PharmD, BCACP, CDCES, CPP [facility] [address] [place], [place] Phone: [phone]; Fax: [phone] 02/02/2021 4:56 PM

## 2021-02-03 ENCOUNTER — Telehealth: Payer: Self-pay | Admitting: Pharmacist

## 2021-02-03 DIAGNOSIS — E785 Hyperlipidemia, unspecified: Secondary | ICD-10-CM

## 2021-02-03 DIAGNOSIS — I2510 Atherosclerotic heart disease of native coronary artery without angina pectoris: Secondary | ICD-10-CM

## 2021-02-03 MED ORDER — REPATHA SURECLICK 140 MG/ML ~~LOC~~ SOAJ: 1.0000 mL | SUBCUTANEOUS | 1 refills | Status: DC

## 2021-02-03 NOTE — Telephone Encounter (Signed)
PA for Repatha approved through 08/01/21.  Called patient and she is aware.

## 2021-04-20 ENCOUNTER — Other Ambulatory Visit: Payer: Self-pay

## 2021-04-20 ENCOUNTER — Other Ambulatory Visit: Payer: Medicare PPO

## 2021-04-20 DIAGNOSIS — E785 Hyperlipidemia, unspecified: Secondary | ICD-10-CM

## 2021-04-20 DIAGNOSIS — I2510 Atherosclerotic heart disease of native coronary artery without angina pectoris: Secondary | ICD-10-CM

## 2021-04-20 LAB — LIPID PANEL
Chol/HDL Ratio: 1.5 ratio (ref 0.0–4.4)
Cholesterol, Total: 134 mg/dL (ref 100–199)
HDL: 87 mg/dL (ref >39)
LDL Chol Calc (NIH): 36 mg/dL (ref 0–99)
Triglycerides: 50 mg/dL (ref 0–149)
VLDL Cholesterol Cal: 11 mg/dL (ref 5–40)

## 2021-04-25 ENCOUNTER — Telehealth: Payer: Self-pay | Admitting: Cardiology

## 2021-04-25 DIAGNOSIS — R079 Chest pain, unspecified: Secondary | ICD-10-CM

## 2021-04-25 DIAGNOSIS — R0600 Dyspnea, unspecified: Secondary | ICD-10-CM

## 2021-04-25 NOTE — Telephone Encounter (Signed)
Spoke with the patient who saw her lab results on MyChart and wanted to make sure she was supposed to continue Repatha. Patient also states that ever since she started taking Repatha she has noticed some SOB with exertion and well as fatigue. She denies any chest pain, swelling, dizziness. She states that she does not have as much energy as she used to and gets winded very quickly when exerting herself.

## 2021-04-25 NOTE — Telephone Encounter (Signed)
Pt c/o medication issue: 1. Name of Medication: all heart medications and Repatha 2. How are you currently taking this medication (dosage and times per day)? Not sure 3. Are you having a reaction (difficulty breathing--STAT)?  No  4. What is your medication issue?  Patient need to know if she needs to continue on all please advise.

## 2021-04-25 NOTE — Telephone Encounter (Signed)
Shared Decision Making/Informed Consent The risks [chest pain, shortness of breath, cardiac arrhythmias, dizziness, blood pressure fluctuations, myocardial infarction, stroke/transient ischemic attack, nausea, vomiting, allergic reaction, radiation exposure, metallic taste sensation and life-threatening complications (estimated to be 1 in 10,000)], benefits (risk stratification, diagnosing coronary artery disease, treatment guidance) and alternatives of a nuclear stress test were discussed in detail with Brittany Mathis and she agrees to proceed.

## 2021-04-25 NOTE — Telephone Encounter (Signed)
Spoke with the patient and reviewed recommendations from Dr. Mayford Knife.  She has been scheduled for an appointment with PharmD.  Steffanie Dunn has been ordered.

## 2021-04-25 NOTE — Telephone Encounter (Signed)
Patient has not been walking 2 miles daily due to the heat. She states that she has been working in her yard early in the mornings before it gets too hot and working around her house a lot. She gets short of breath when she over exerts herself with these activities.

## 2021-04-27 DIAGNOSIS — Z1231 Encounter for screening mammogram for malignant neoplasm of breast: Secondary | ICD-10-CM | POA: Diagnosis not present

## 2021-04-27 LAB — HM MAMMOGRAPHY

## 2021-05-05 ENCOUNTER — Encounter: Payer: Self-pay | Admitting: Internal Medicine

## 2021-05-09 ENCOUNTER — Telehealth (HOSPITAL_COMMUNITY): Payer: Self-pay

## 2021-05-09 NOTE — Telephone Encounter (Signed)
Spoke with the patient, detailed instructions given. She stated she would be here for her test. Asked to call back with any questions. S.Kobee Medlen EMTP 

## 2021-05-09 NOTE — Progress Notes (Signed)
Patient ID: Brittany Mathis                 DOB: 04-25-1946                    MRN: [id number]     HPI: Brittany Mathis is a 75 y.o. female patient referred to lipid clinic by Dr. Mayford Knife. PMH is significant for HTN, CAD, HLD and an elevated calcium score of 1431 (97th percentile) in 06/2019. LHC in 08/2019 revealed severe 2 vessel obstructive CAD (70% RPAV, 25% RCA, 70% pLAD, 90% mLAD, 90% D1) but given complexity of disease was not a PCI candidate. Aggressive medical therapy recommended.   At 11/30/20 office visit with Dr. Mayford Knife, rosuvastatin was increased to 40 mg daily which she was tolerated well. Last seen by lipid clinic 02/02/21 at which time Repatha 140 mg every 2 weeks was started since LDL had increased since increasing rosuvastatin dose. Lipid panel from 04/20/21 shows LDL has decreased from 95 to 36 mg/dL. Pt called office on 7/11 after lab resulted and reported she has been having some SOB with exertion and fatigue since starting Repatha and wondered if she needed to continue therapy. No chest pain, swelling, dizziness. Reported less exercise recently due to heat. Lexiscan myoview scheduled for 7/28 and referred back to pharmacy clinic.    Today, patient presents in good spirits to discuss these symptoms and possibility of Repatha contributing to them. Endorses chest pressure and SOB with normal gardening/house work that she used to be able to do with no symptoms. Reports this especially occurs in the humidity. When it happens she goes inside to rest and drinks a cold glass of water, and it goes away after about 30 minutes. She notes that this began happening within a few weeks of starting Repatha but that there have been other factors since around that time that could also contribute to these symptoms. This was also around the time the heat/humidity has been getting worse and she has not been walking 2 miles/day due to this. 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 She notes that she is not sure if her symptoms are due to her CAD potentially worsening, to the Repatha, or due to these other factors at play. She denies signs of allergic reaction with Repatha (rash, itchiness). Confirmed appropriate injection technique. Her last Repatha dose was last Tuesday 7/19. She is very pleased with her LDL results since starting Repatha.   Current Medications: rosuvastatin 40 mg daily, Repatha 140 mg every 14 days Intolerances: None Risk Factors: CAD, elevated calcium score, family hx ASCVD LDL goal: <70 mg/dL  Diet: Mediterranean diet, fresh fruits and vegetables, potatoes, eggs, chicken and salmon, red meat once a week, 1/2 grapefruit for breakfast with english muffin.  Spinach salad for lunch.  Grilled chicken, broccolini, for dinner.  Does not eat sugar and salt  Exercise:  Walks 2 miles a day with husband between 40 and 80 degrees - not recently due to heat.  Water, orange juice.   Family History: Father had CAD.    Social History: 2 glasses a day of wine a night.    Labs: 11/11/20: TC 199, TG 53, HDL 99, LDL 89 (rosuvastatin 20 mg) 01/11/21: TC 215, TG 63, HDL 109, LDL 95 (rosuvastatin 40 mg) 04/20/21: TC 134, TG 50, HDL 87,  LDL 36 (rosuvastatin 40 mg, Repatha 140 mg)  Past Medical History:  Diagnosis Date   Carotid artery stenosis    1-39% bilateral by dopplers 11/2019   Cataract    History of shingles    Hyperlipidemia    Hypertension    Nephrolithiasis 1972   Osteoporosis 05/2017   T score -2.7   Thyroid disease 2010   thyroiditis    Current Outpatient Medications on File Prior to Visit  Medication Sig Dispense Refill   alendronate (FOSAMAX) 70 MG tablet Take 1 tablet (70 mg total) by mouth every Tuesday. Take with a full glass of water on an empty stomach. 12 tablet 3   aspirin EC 81 MG tablet Take 81 mg by mouth daily with lunch.       Cholecalciferol (VITAMIN D) 50 MCG (2000 UT) tablet Take 2,000 Units by mouth daily with lunch.     Evolocumab (REPATHA SURECLICK) 140 MG/ML SOAJ Inject 1 mL into the skin every 14 (fourteen) days. 6 mL 1   metoprolol succinate (TOPROL-XL) 25 MG 24 hr tablet Take 1 tablet (25 mg total) by mouth daily. 90 tablet 3   nitroGLYCERIN (NITROSTAT) 0.4 MG SL tablet Place 1 tablet (0.4 mg total) under the tongue every 5 (five) minutes as needed for chest pain. 25 tablet 3   Propylene Glycol (SYSTANE COMPLETE) 0.6 % SOLN Place 1 drop into both eyes daily.     rosuvastatin (CRESTOR) 40 MG tablet Take 1 tablet (40 mg total) by mouth daily. 90 tablet 3   valsartan (DIOVAN) 160 MG tablet Take 1 tablet (160 mg total) by mouth daily. 90 tablet 3   No current facility-administered medications on file prior to visit.    No Known Allergies  Assessment/Plan:  1. Hyperlipidemia - LDL is now at goal <70 mg/dL since starting Repatha 3 months ago. Would not expect her symptoms of shortness of breath and chest pressure to be due to Repatha, however cannot rule this out. We discussed that this could be due to her CAD as her symptoms seem similar to exertional angina as they come on with activity and improve with rest. We will wait for the results of her stress test which is scheduled for Thursday to decide on next steps. Reviewed potential plans pending these results. If it does not appear her symptoms are due to her CAD, could trial off of Repatha to see if her symptoms improve. Could also try switching to Praluent to see if these symptoms improve. Praluent is a tier 3 medication whereas Repatha is tier 2, so it will cost her ~$50 more per 90 day supply than what she is currently paying. A new prior authorization for Praluent would also need to be submitted. If it appears her symptoms are due to CAD, could also continue on Repatha if that is the patient's preference. She will think about these options and we will call her to  discuss what step to take next after the results of her stress test.    Pervis Hocking, PharmD PGY2 Ambulatory Care Pharmacy Resident 05/10/2021 2:09 PM

## 2021-05-10 ENCOUNTER — Ambulatory Visit (INDEPENDENT_AMBULATORY_CARE_PROVIDER_SITE_OTHER): Payer: Medicare PPO | Admitting: Student-PharmD

## 2021-05-10 ENCOUNTER — Other Ambulatory Visit: Payer: Self-pay

## 2021-05-10 DIAGNOSIS — E782 Mixed hyperlipidemia: Secondary | ICD-10-CM | POA: Diagnosis not present

## 2021-05-10 DIAGNOSIS — Z90710 Acquired absence of both cervix and uterus: Secondary | ICD-10-CM | POA: Insufficient documentation

## 2021-05-10 DIAGNOSIS — Z9079 Acquired absence of other genital organ(s): Secondary | ICD-10-CM | POA: Insufficient documentation

## 2021-05-10 NOTE — Patient Instructions (Signed)
We will be in touch after your stress test results to decide what next steps to take:  We could trial off of Repatha to see if you still experience shortness of breath and chest pressure We could try switching to Praluent to see if you experience the same or if it is better

## 2021-05-10 NOTE — Progress Notes (Signed)
Brittany Mathis Aug 21, 1946 [id number]   History:  75 y.o. [name] presents for breast and pelvic exam. Postmenopausal - no HRT. Was on estradiol patch but weaned after CAD diagnosis. S/P 1983 TAH BSO for endometriosis and menorrhagia. She is complaining of vaginal dryness that causes irritation and burning. She tried OTC lubricants but they have not been effective and she is interested in vaginal estrogen. On Fosamax x 3 years for osteoporosis. HLD, HTN, CAD managed by cardiology.   Gynecologic History No LMP recorded. Patient is postmenopausal.   Contraception: status post hysterectomy  Health Maintenance Last Pap: No longer screening per guidelines Last mammogram: 04/27/2021. Results were: Normal Last colonoscopy: 08/10/2020, 5-year recall Last Dexa: 06/08/2020. Results were: T-score -2.4  Past medical history, past surgical history, family history and social history were all reviewed and documented in the EPIC chart. Married. Daughter work for [employer] in [place]. Mother in nursing home in her 43s, breast cancer.   ROS:  A ROS was performed and pertinent positives and negatives are included.  Exam:  Vitals:   05/11/21 0958  BP: 120/76  Weight: 94 lb (42.6 kg)  Height: 5' 3.5" (1.613 m)   Body mass index is 16.39 kg/m.  General appearance:  Normal Thyroid:  Symmetrical, normal in size, without palpable masses or nodularity. Respiratory  Auscultation:  Clear without wheezing or rhonchi Cardiovascular  Auscultation:  Regular rate, without rubs, murmurs or gallops  Edema/varicosities:  Not grossly evident Abdominal  Soft,nontender, without masses, guarding or rebound.  Liver/spleen:  No organomegaly noted  Hernia:  None appreciated  Skin  Inspection:  Grossly normal Breasts: Examined lying and sitting.   Right: Without masses, retractions, nipple discharge or axillary adenopathy.   Left: Without masses, retractions, nipple discharge or axillary  adenopathy. Genitourinary   Inguinal/mons:  Normal without inguinal adenopathy  External genitalia:  Atrophic changes  BUS/Urethra/Skene's glands:  Normal  Vagina:  Atrophic changes  Cervix:  Absent  Uterus:  Absent  Anus and perineum: Normal  Digital rectal exam: Normal sphincter tone without palpated masses or tenderness  Patient informed chaperone available to be present for breast and pelvic exam. Patient has requested no chaperone to be present. Patient has been advised what will be completed during breast and pelvic exam.   Assessment/Plan:  75 y.o. [name] for breast and pelvic exam.   Well female exam with routine gynecological exam - Education provided on SBEs, importance of preventative screenings, current guidelines, high calcium diet, regular exercise, and multivitamin daily. Labs with PCP.   Postmenopausal - no HRT.   Postmenopausal osteoporosis - Plan: alendronate (FOSAMAX) 70 MG tablet weekly. Started Fosamax 3 years ago. T-score -2.4 05/2020. Will repeat Dexa in 1 year. Continue vitamin D supplement and regular exercise.  History of total abdominal hysterectomy and bilateral salpingo-oophorectomy - 1983 for endometriosis and menorrhagia.  Atrophic vaginitis - Plan: estradiol (ESTRACE VAGINAL) 0.1 MG/GM vaginal cream twice weekly. We discussed the slight risk of systemic absorption increasing her risk for blood clots, stroke, heart attack, and breast cancer. She will start nightly x 2 weeks, then decrease to every other night x 2 weeks, then twice weekly.   Screening for cervical cancer - Normal Pap history. No longer screening per guidelines.   Screening for breast cancer - Normal mammogram history.  Continue annual screenings.  Normal breast exam today.  Screening for colon cancer - 2021 colonoscopy. Will repeat at GI's recommended interval.   Return in 1 year for breast and pelvic exam.  Olivia Mackie DNP, 10:38 AM 05/11/2021

## 2021-05-11 ENCOUNTER — Encounter: Payer: Medicare PPO | Admitting: Obstetrics and Gynecology

## 2021-05-11 ENCOUNTER — Ambulatory Visit (INDEPENDENT_AMBULATORY_CARE_PROVIDER_SITE_OTHER): Payer: Medicare PPO | Admitting: Nurse Practitioner

## 2021-05-11 ENCOUNTER — Encounter: Payer: Self-pay | Admitting: Nurse Practitioner

## 2021-05-11 VITALS — BP 120/76 | Ht 63.5 in | Wt 94.0 lb

## 2021-05-11 DIAGNOSIS — M810 Age-related osteoporosis without current pathological fracture: Secondary | ICD-10-CM

## 2021-05-11 DIAGNOSIS — Z01419 Encounter for gynecological examination (general) (routine) without abnormal findings: Secondary | ICD-10-CM | POA: Diagnosis not present

## 2021-05-11 DIAGNOSIS — Z9079 Acquired absence of other genital organ(s): Secondary | ICD-10-CM | POA: Diagnosis not present

## 2021-05-11 DIAGNOSIS — Z90722 Acquired absence of ovaries, bilateral: Secondary | ICD-10-CM

## 2021-05-11 DIAGNOSIS — Z90710 Acquired absence of both cervix and uterus: Secondary | ICD-10-CM

## 2021-05-11 DIAGNOSIS — Z780 Asymptomatic menopausal state: Secondary | ICD-10-CM | POA: Diagnosis not present

## 2021-05-11 DIAGNOSIS — N952 Postmenopausal atrophic vaginitis: Secondary | ICD-10-CM

## 2021-05-11 MED ORDER — ESTRADIOL 0.1 MG/GM VA CREA: 1.0000 g | TOPICAL_CREAM | VAGINAL | 12 refills | Status: DC

## 2021-05-11 MED ORDER — ALENDRONATE SODIUM 70 MG PO TABS: 70.0000 mg | ORAL_TABLET | ORAL | 4 refills | Status: DC

## 2021-05-12 ENCOUNTER — Other Ambulatory Visit: Payer: Self-pay

## 2021-05-12 ENCOUNTER — Ambulatory Visit (HOSPITAL_COMMUNITY): Payer: Medicare PPO | Attending: Cardiovascular Disease

## 2021-05-12 DIAGNOSIS — R0600 Dyspnea, unspecified: Secondary | ICD-10-CM | POA: Diagnosis not present

## 2021-05-12 DIAGNOSIS — R079 Chest pain, unspecified: Secondary | ICD-10-CM | POA: Diagnosis not present

## 2021-05-12 LAB — MYOCARDIAL PERFUSION IMAGING
LV dias vol: 52 mL (ref 46–106)
LV sys vol: 10 mL
Peak HR: 88 {beats}/min
Rest HR: 54 {beats}/min
SDS: 1
SRS: 0
SSS: 1
TID: 0.96

## 2021-05-12 MED ORDER — TECHNETIUM TC 99M TETROFOSMIN IV KIT
29.4000 | PACK | Freq: Once | INTRAVENOUS | Status: AC | PRN
  Filled 2021-05-12: qty 30

## 2021-05-12 MED ORDER — TECHNETIUM TC 99M TETROFOSMIN IV KIT
11.0000 | PACK | Freq: Once | INTRAVENOUS | Status: AC | PRN
  Administered 2021-05-12: 11 via INTRAVENOUS
  Filled 2021-05-12: qty 11

## 2021-05-12 MED ORDER — REGADENOSON 0.4 MG/5ML IV SOLN: 0.4000 mg | Freq: Once | INTRAVENOUS | Status: AC

## 2021-05-16 ENCOUNTER — Telehealth: Payer: Self-pay

## 2021-05-16 DIAGNOSIS — R0600 Dyspnea, unspecified: Secondary | ICD-10-CM

## 2021-05-16 NOTE — Telephone Encounter (Signed)
-----   Message from Quintella Reichert, MD sent at 05/13/2021  4:18 PM EDT ----- Refer to puolmonary ----- Message ----- From: [name], RN Sent: 05/13/2021   1:09 PM EDT To: Quintella Reichert, MD  The patient has been notified of the result and verbalized understanding.  All questions (if any) were answered. [name], RN 05/13/2021 1:09 PM   Patient has shortness of breath only with exertion and when she is out in the heat.  Advised on avoiding the heat and taking her walks early in the morning.

## 2021-05-17 ENCOUNTER — Telehealth: Payer: Self-pay | Admitting: Student-PharmD

## 2021-05-17 NOTE — Telephone Encounter (Signed)
Called patient to discuss PCSK9 inhibitor plan following the results of her stress test on 7/28 showing no ischemia/low risk. She has now been off of Repatha for 2 weeks and has seen no change in her symptoms of shortness of breath with exertion when she is out in the heat. She was initially concerned that these symptoms could be related to Repatha. We discussed options at her last visit on 7/26 regarding continuing on Repatha vs switching to Praluent (which would be more expensive) to see if her symptoms improve. However, she does not think her symptoms are related to Repatha since they are unchanged after 2 weeks of holding therapy and more likely related to the heat, so she would like to continue Repatha. I agree that these symptoms are unlikely to be related to Repatha. She will resume Repatha today.

## 2021-05-18 ENCOUNTER — Other Ambulatory Visit: Payer: Self-pay

## 2021-05-18 ENCOUNTER — Ambulatory Visit: Payer: Medicare PPO | Admitting: Internal Medicine

## 2021-05-18 ENCOUNTER — Encounter: Payer: Self-pay | Admitting: Internal Medicine

## 2021-05-18 VITALS — BP 142/78 | HR 57 | Temp 97.7°F | Ht 63.5 in | Wt 91.6 lb

## 2021-05-18 DIAGNOSIS — I700 Atherosclerosis of aorta: Secondary | ICD-10-CM | POA: Diagnosis not present

## 2021-05-18 DIAGNOSIS — R7989 Other specified abnormal findings of blood chemistry: Secondary | ICD-10-CM

## 2021-05-18 DIAGNOSIS — I6523 Occlusion and stenosis of bilateral carotid arteries: Secondary | ICD-10-CM

## 2021-05-18 DIAGNOSIS — R634 Abnormal weight loss: Secondary | ICD-10-CM

## 2021-05-18 DIAGNOSIS — I2510 Atherosclerotic heart disease of native coronary artery without angina pectoris: Secondary | ICD-10-CM | POA: Diagnosis not present

## 2021-05-18 DIAGNOSIS — E782 Mixed hyperlipidemia: Secondary | ICD-10-CM | POA: Diagnosis not present

## 2021-05-18 DIAGNOSIS — Z0000 Encounter for general adult medical examination without abnormal findings: Secondary | ICD-10-CM

## 2021-05-18 DIAGNOSIS — E871 Hypo-osmolality and hyponatremia: Secondary | ICD-10-CM | POA: Diagnosis not present

## 2021-05-18 DIAGNOSIS — E875 Hyperkalemia: Secondary | ICD-10-CM

## 2021-05-18 LAB — URINALYSIS
Bilirubin Urine: NEGATIVE
Hgb urine dipstick: NEGATIVE
Ketones, ur: NEGATIVE
Leukocytes,Ua: NEGATIVE
Nitrite: NEGATIVE
Specific Gravity, Urine: 1.01 (ref 1.000–1.030)
Total Protein, Urine: NEGATIVE
Urine Glucose: NEGATIVE
Urobilinogen, UA: 0.2 (ref 0.0–1.0)
pH: 7.5 (ref 5.0–8.0)

## 2021-05-18 LAB — CBC WITH DIFFERENTIAL/PLATELET
Basophils Absolute: 0.1 10*3/uL (ref 0.0–0.1)
Basophils Relative: 1.2 % (ref 0.0–3.0)
Eosinophils Absolute: 0.4 10*3/uL (ref 0.0–0.7)
Eosinophils Relative: 7.7 % — ABNORMAL HIGH (ref 0.0–5.0)
HCT: 39.3 % (ref 36.0–46.0)
Hemoglobin: 13.1 g/dL (ref 12.0–15.0)
Lymphocytes Relative: 31.7 % (ref 12.0–46.0)
Lymphs Abs: 1.8 10*3/uL (ref 0.7–4.0)
MCHC: 33.3 g/dL (ref 30.0–36.0)
MCV: 89.9 fl (ref 78.0–100.0)
Monocytes Absolute: 0.7 10*3/uL (ref 0.1–1.0)
Monocytes Relative: 12 % (ref 3.0–12.0)
Neutro Abs: 2.6 10*3/uL (ref 1.4–7.7)
Neutrophils Relative %: 47.4 % (ref 43.0–77.0)
Platelets: 236 10*3/uL (ref 150.0–400.0)
RBC: 4.37 Mil/uL (ref 3.87–5.11)
RDW: 12.4 % (ref 11.5–15.5)
WBC: 5.6 10*3/uL (ref 4.0–10.5)

## 2021-05-18 LAB — COMPREHENSIVE METABOLIC PANEL
ALT: 37 U/L — ABNORMAL HIGH (ref 0–35)
AST: 43 U/L — ABNORMAL HIGH (ref 0–37)
Albumin: 4.3 g/dL (ref 3.5–5.2)
Alkaline Phosphatase: 54 U/L (ref 39–117)
BUN: 13 mg/dL (ref 6–23)
CO2: 27 mEq/L (ref 19–32)
Calcium: 9.7 mg/dL (ref 8.4–10.5)
Chloride: 94 mEq/L — ABNORMAL LOW (ref 96–112)
Creatinine, Ser: 0.58 mg/dL (ref 0.40–1.20)
GFR: 87.78 mL/min (ref >60.00)
Glucose, Bld: 95 mg/dL (ref 70–99)
Potassium: 5.6 mEq/L — ABNORMAL HIGH (ref 3.5–5.1)
Sodium: 130 mEq/L — ABNORMAL LOW (ref 135–145)
Total Bilirubin: 0.5 mg/dL (ref 0.2–1.2)
Total Protein: 7.7 g/dL (ref 6.0–8.3)

## 2021-05-18 LAB — LIPID PANEL
Cholesterol: 164 mg/dL (ref 0–200)
HDL: 93 mg/dL (ref >39.00)
LDL Cholesterol: 62 mg/dL (ref 0–99)
NonHDL: 71.31
Total CHOL/HDL Ratio: 2
Triglycerides: 46 mg/dL (ref 0.0–149.0)
VLDL: 9.2 mg/dL (ref 0.0–40.0)

## 2021-05-18 LAB — TSH: TSH: 1.97 u[IU]/mL (ref 0.35–5.50)

## 2021-05-18 NOTE — Assessment & Plan Note (Addendum)
Jama just had a stress  test Continue with Crestor, Repatha

## 2021-05-18 NOTE — Assessment & Plan Note (Signed)
Continue with Crestor, Repatha °

## 2021-05-18 NOTE — Assessment & Plan Note (Addendum)
Continue with Crestor, Repatha

## 2021-05-18 NOTE — Progress Notes (Signed)
Subjective:  Patient ID: Brittany Mathis, female    DOB: 05-Jan-1946  Age: 75 y.o. MRN: [id number]  CC: Follow-up (6 MONTH F/U)   HPI Brittany Mathis presents for CAD, dyslipidemia, HTN f/u.  Brittany Mathis is loosing wt - stressed Here for a well exam  Outpatient Medications Prior to Visit  Medication Sig Dispense Refill   alendronate (FOSAMAX) 70 MG tablet Take 1 tablet (70 mg total) by mouth every Tuesday. Take with a full glass of water on an empty stomach. 12 tablet 4   aspirin EC 81 MG tablet Take 81 mg by mouth daily with lunch.      Cholecalciferol (VITAMIN D) 50 MCG (2000 UT) tablet Take 2,000 Units by mouth daily with lunch.     estradiol (ESTRACE VAGINAL) 0.1 MG/GM vaginal cream Place 1 g vaginally 2 (two) times a week. Initial dose: nightly x 2 weeks, then every other night x 2 weeks, then twice weekly 42.5 g 12   Evolocumab (REPATHA SURECLICK) 140 MG/ML SOAJ Inject 1 mL into the skin every 14 (fourteen) days. 6 mL 1   metoprolol succinate (TOPROL-XL) 25 MG 24 hr tablet Take 1 tablet (25 mg total) by mouth daily. 90 tablet 3   Propylene Glycol (SYSTANE COMPLETE) 0.6 % SOLN Place 1 drop into both eyes daily.     rosuvastatin (CRESTOR) 40 MG tablet Take 1 tablet (40 mg total) by mouth daily. 90 tablet 3   valsartan (DIOVAN) 160 MG tablet Take 1 tablet (160 mg total) by mouth daily. 90 tablet 3   nitroGLYCERIN (NITROSTAT) 0.4 MG SL tablet Place 1 tablet (0.4 mg total) under the tongue every 5 (five) minutes as needed for chest pain. 25 tablet 3   Facility-Administered Medications Prior to Visit  Medication Dose Route Frequency Provider Last Rate Last Admin   regadenoson (LEXISCAN) injection SOLN 0.4 mg  0.4 mg Intravenous Once Chilton Si, MD       technetium tetrofosmin (TC-MYOVIEW) injection 29.4 millicurie  29.4 millicurie Intravenous Once PRN Chilton Si, MD        ROS: Review of Systems  Constitutional:  Positive for unexpected weight change. Negative for activity change,  appetite change, chills and fatigue.  HENT:  Negative for congestion, mouth sores and sinus pressure.   Eyes:  Negative for visual disturbance.  Respiratory:  Negative for cough and chest tightness.   Gastrointestinal:  Negative for abdominal pain and nausea.  Genitourinary:  Negative for difficulty urinating, frequency and vaginal pain.  Musculoskeletal:  Negative for back pain and gait problem.  Skin:  Negative for pallor and rash.  Neurological:  Negative for dizziness, tremors, weakness, numbness and headaches.  Psychiatric/Behavioral:  Negative for confusion, sleep disturbance and suicidal ideas. The patient is nervous/anxious.    Objective:  BP (!) 142/78 (BP Location: Left Arm)   Pulse (!) 57   Temp 97.7 F (36.5 C) (Oral)   Ht 5' 3.5" (1.613 m)   Wt 91 lb 9.6 oz (41.5 kg)   SpO2 97%   BMI 15.97 kg/m   BP Readings from Last 3 Encounters:  05/18/21 (!) 142/78  05/11/21 120/76  11/30/20 (!) 152/68    Wt Readings from Last 3 Encounters:  05/18/21 91 lb 9.6 oz (41.5 kg)  05/12/21 98 lb (44.5 kg)  05/11/21 94 lb (42.6 kg)    Physical Exam Constitutional:      General: She is not in acute distress.    Appearance: She is well-developed.  HENT:  Head: Normocephalic.     Right Ear: External ear normal.     Left Ear: External ear normal.     Nose: Nose normal.  Eyes:     General:        Right eye: No discharge.        Left eye: No discharge.     Conjunctiva/sclera: Conjunctivae normal.     Pupils: Pupils are equal, round, and reactive to light.  Neck:     Thyroid: No thyromegaly.     Vascular: No JVD.     Trachea: No tracheal deviation.  Cardiovascular:     Rate and Rhythm: Normal rate and regular rhythm.     Heart sounds: Normal heart sounds.  Pulmonary:     Effort: No respiratory distress.     Breath sounds: No stridor. No wheezing.  Abdominal:     General: Bowel sounds are normal. There is no distension.     Palpations: Abdomen is soft. There is no  mass.     Tenderness: There is no abdominal tenderness. There is no guarding or rebound.  Musculoskeletal:        General: No tenderness.     Cervical back: Normal range of motion and neck supple. No rigidity.  Lymphadenopathy:     Cervical: No cervical adenopathy.  Skin:    Findings: No erythema or rash.  Neurological:     Mental Status: She is oriented to person, place, and time.     Cranial Nerves: No cranial nerve deficit.     Motor: No abnormal muscle tone.     Coordination: Coordination normal.     Deep Tendon Reflexes: Reflexes normal.  Psychiatric:        Behavior: Behavior normal.        Thought Content: Thought content normal.        Judgment: Judgment normal.  Thin   Lab Results  Component Value Date   WBC 5.4 11/11/2020   HGB 13.3 11/11/2020   HCT 39.3 11/11/2020   PLT 243.0 11/11/2020   GLUCOSE 85 12/20/2020   CHOL 134 04/20/2021   TRIG 50 04/20/2021   HDL 87 04/20/2021   LDLDIRECT 109.2 02/05/2012   LDLCALC 36 04/20/2021   ALT 50 (H) 01/11/2021   AST 30 12/20/2020   NA 133 (L) 12/20/2020   K 5.6 No hemolysis seen (H) 12/20/2020   CL 98 12/20/2020   CREATININE 0.59 12/20/2020   BUN 13 12/20/2020   CO2 29 12/20/2020   TSH 1.85 11/11/2020    VAS US CAROTID  Result Date: 11/19/2019 Carotid Arterial Duplex Study Indications:  Bilateral bruits. Patient denies any cerebrovascular symptoms, but               says she does have a strong family history of cardiac diseaes and               an elevated coronary calcium score. Risk Factors: Hypertension, hyperlipidemia, no history of smoking. Performing Technologist: Olegario Shearer RVT  Examination Guidelines: A complete evaluation includes B-mode imaging, spectral Doppler, color Doppler, and power Doppler as needed of all accessible portions of each vessel. Bilateral testing is considered an integral part of a complete examination. Limited examinations for reoccurring indications may be performed as noted.  Right  Carotid Findings: +----------+--------+--------+--------+------------------+--------+           PSV cm/sEDV cm/sStenosisPlaque DescriptionComments +----------+--------+--------+--------+------------------+--------+ CCA Prox  74      15                                         +----------+--------+--------+--------+------------------+--------+  CCA Distal61      13                                         +----------+--------+--------+--------+------------------+--------+ ICA Prox  54      14      1-39%   heterogenous               +----------+--------+--------+--------+------------------+--------+ ICA Mid   52      18                                         +----------+--------+--------+--------+------------------+--------+ ICA Distal108     30                                         +----------+--------+--------+--------+------------------+--------+ ECA       48      7                                          +----------+--------+--------+--------+------------------+--------+ +----------+--------+-------+----------------+-------------------+           PSV cm/sEDV cmsDescribe        Arm Pressure (mmHG) +----------+--------+-------+----------------+-------------------+ [id number]            Multiphasic, [id number]                 +----------+--------+-------+----------------+-------------------+ +---------+--------+--+--------+--+---------+ VertebralPSV cm/s70EDV cm/s17Antegrade +---------+--------+--+--------+--+---------+  Left Carotid Findings: +----------+--------+--------+--------+------------------+--------+           PSV cm/sEDV cm/sStenosisPlaque DescriptionComments +----------+--------+--------+--------+------------------+--------+ CCA Prox  60      14                                         +----------+--------+--------+--------+------------------+--------+ CCA Distal54      16                                          +----------+--------+--------+--------+------------------+--------+ ICA Prox  60      18              heterogenous               +----------+--------+--------+--------+------------------+--------+ ICA Mid   87      26      1-39%                              +----------+--------+--------+--------+------------------+--------+ ICA Distal89      34                                         +----------+--------+--------+--------+------------------+--------+ ECA       63      5                                          +----------+--------+--------+--------+------------------+--------+ +----------+--------+--------+----------------+-------------------+  PSV cm/sEDV cm/sDescribe        Arm Pressure (mmHG) +----------+--------+--------+----------------+-------------------+ [id number]             Multiphasic, [id number]                 +----------+--------+--------+----------------+-------------------+ +---------+--------+--+--------+-+---------+ VertebralPSV cm/s23EDV cm/s7Antegrade +---------+--------+--+--------+-+---------+   Summary: Right Carotid: Velocities in the right ICA are consistent with a 1-39% stenosis. Left Carotid: Velocities in the left ICA are consistent with a 1-39% stenosis. Vertebrals:  Bilateral vertebral arteries demonstrate antegrade flow. Subclavians: Normal flow hemodynamics were seen in bilateral subclavian              arteries. *See table(s) above for measurements and observations.  Electronically signed by Lance MussJayadeep Varanasi MD on 11/19/2019 at 11:50:52 AM.    Final     Assessment & Plan:    Brittany PrimesAlex Eriona Kinchen, MD

## 2021-05-18 NOTE — Addendum Note (Signed)
Addended by: Tresa Garter on: 05/18/2021 11:37 PM   Modules accepted: Orders

## 2021-05-18 NOTE — Assessment & Plan Note (Signed)
On Simvastatin, Repatha 

## 2021-05-18 NOTE — Assessment & Plan Note (Signed)
Unclear etiology.  We may need to reduce valsartan and metoprolol.  Repeat cMet

## 2021-05-18 NOTE — Addendum Note (Signed)
Addended by: Madelon Lips on: 05/18/2021 10:00 AM   Modules accepted: Orders

## 2021-05-18 NOTE — Assessment & Plan Note (Signed)
Repeat sodium and 3 months

## 2021-05-18 NOTE — Patient Instructions (Signed)
  Start Boost or Ensure

## 2021-05-18 NOTE — Assessment & Plan Note (Signed)
Chronic, mild.  Likely related to wine drinking and rosuvastatin therapy.  We will continue to monitor

## 2021-05-18 NOTE — Assessment & Plan Note (Addendum)
Likely stress related. Good appetite. Not depressed 2 glasses of wine daily Start Boost or Ensure

## 2021-05-18 NOTE — Assessment & Plan Note (Signed)
  We discussed age appropriate health related issues, including available/recomended screening tests and vaccinations. Labs were ordered to be later reviewed . All questions were answered. We discussed one or more of the following - seat belt use, use of sunscreen/sun exposure exercise,  fall risk reduction, second hand smoke exposure, firearm use and storage, seat belt use, a need for adhering to healthy diet and exercise. Labs were ordered.  All questions were answered.   Wyline Beady GYN NP Mammo - yearly Eye exam Dr Randon Goldsmith DEXA 2021

## 2021-06-01 ENCOUNTER — Institutional Professional Consult (permissible substitution): Payer: Medicare PPO | Admitting: Student

## 2021-06-01 NOTE — Progress Notes (Signed)
Synopsis: Referred for dyspnea by Quintella Reichert, MD  Subjective:   PATIENT ID: Brittany Mathis GENDER: female DOB: 1946-05-18, MRN: [id number]  Chief Complaint  Patient presents with   Consult    SOB with activity     74yF with history of never smoker, HTN, severe 2 vessel CAD based on cath in 2020 not candidate for PCI who was referred from cardiology for persistent dyspnea after negative pharmacologic nuke stress.   She says that Dr. Posey Rea had referred her for CAC testing, then got plugged in with cardiologist and got LHC, discovered severe multivessel disease as above. Is on a lot of CAD directed medications.   She says currently when she exerts herself or when she's exposed to high heat and humidity develops chest tightness, central. If she goes to a quiet room, drinks a glass of cold water it goes away, emphasizes that rest is key. Never has tried NG for it. Hasn't tried any inhalers, never had asthma as a kid. She actually hasn't had an episode in 2-3 weeks since temperature has come down and she's attenuated her outdoor activities. No accompanying throat tightness. She notices no substantial associated cough. THere may be mild component of post-nasal drip. She notices no trouble with GERD. No orthopnea.  She was a high school english/middle school teacher for 30 years, retired in 2009. In [employer] system. No significant toxic exposures. She has no pets including no birds. She did live briefly in [facility] for 2 years.   She has no allergies to medicatiosn  She has no family history of lung disease.   Otherwise pertinent review of systems is negative.  Past Medical History:  Diagnosis Date   Carotid artery stenosis    1-39% bilateral by dopplers 11/2019   Cataract    History of shingles    Hyperlipidemia    Hypertension    Nephrolithiasis 1972   Osteoporosis 05/2017   T score -2.7   Thyroid disease 2010   thyroiditis     Family History  Problem Relation Age of Onset    Breast cancer Mother    Hypertension Mother    Hyperlipidemia Mother    Stroke Mother    Hypertension Father    Hyperlipidemia Father    Glaucoma Father    Kidney disease Father    Colon cancer Maternal Aunt      Past Surgical History:  Procedure Laterality Date   ABDOMINAL HYSTERECTOMY  1983   with BSO  menorrhagia, endometriosis   cataracts Bilateral 2018   LEFT HEART CATH AND CORONARY ANGIOGRAPHY N/A 09/05/2019   Procedure: LEFT HEART CATH AND CORONARY ANGIOGRAPHY;  Surgeon: [place], Peter M, MD;  Location: [facility] INVASIVE CV [facility];  Service: Cardiovascular;  Laterality: N/A;   OOPHORECTOMY     BSO    Social History   Socioeconomic History   Marital status: Married    Spouse name: Not on file   Number of children: Not on file   Years of education: Not on file   Highest education level: Not on file  Occupational History   Not on file  Tobacco Use   Smoking status: Never   Smokeless tobacco: Never   Tobacco comments:    experimented in college  Vaping Use   Vaping Use: Never used  Substance and Sexual Activity   Alcohol use: Yes    Alcohol/week: 14.0 standard drinks    Types: 14 Standard drinks or equivalent per week   Drug use: No   Sexual  activity: Not Currently    Birth control/protection: Surgical    Comment: HYST-1st intercourse 54 yo-1 partner  Other Topics Concern   Not on file  Social History Narrative   Not on file   Social Determinants of Health   Financial Resource Strain: Not on file  Food Insecurity: Not on file  Transportation Needs: Not on file  Physical Activity: Not on file  Stress: Not on file  Social Connections: Not on file  Intimate Partner Violence: Not on file     No Known Allergies   Outpatient Medications Prior to Visit  Medication Sig Dispense Refill   alendronate (FOSAMAX) 70 MG tablet Take 1 tablet (70 mg total) by mouth every Tuesday. Take with a full glass of water on an empty stomach. 12 tablet 4   aspirin EC 81 MG tablet  Take 81 mg by mouth daily with lunch.      Cholecalciferol (VITAMIN D) 50 MCG (2000 UT) tablet Take 2,000 Units by mouth daily with lunch.     estradiol (ESTRACE VAGINAL) 0.1 MG/GM vaginal cream Place 1 g vaginally 2 (two) times a week. Initial dose: nightly x 2 weeks, then every other night x 2 weeks, then twice weekly 42.5 g 12   Evolocumab (REPATHA SURECLICK) 140 MG/ML SOAJ Inject 1 mL into the skin every 14 (fourteen) days. 6 mL 1   metoprolol succinate (TOPROL-XL) 25 MG 24 hr tablet Take 1 tablet (25 mg total) by mouth daily. 90 tablet 3   Propylene Glycol (SYSTANE COMPLETE) 0.6 % SOLN Place 1 drop into both eyes daily.     rosuvastatin (CRESTOR) 40 MG tablet Take 1 tablet (40 mg total) by mouth daily. 90 tablet 3   valsartan (DIOVAN) 160 MG tablet Take 1 tablet (160 mg total) by mouth daily. 90 tablet 3   nitroGLYCERIN (NITROSTAT) 0.4 MG SL tablet Place 1 tablet (0.4 mg total) under the tongue every 5 (five) minutes as needed for chest pain. 25 tablet 3   Facility-Administered Medications Prior to Visit  Medication Dose Route Frequency Provider Last Rate Last Admin   regadenoson (LEXISCAN) injection SOLN 0.4 mg  0.4 mg Intravenous Once Chilton Si, MD       technetium tetrofosmin (TC-MYOVIEW) injection 29.4 millicurie  29.4 millicurie Intravenous Once PRN Chilton Si, MD           Objective:   Physical Exam:  General appearance: 75 y.o., female, NAD, conversant  Eyes: anicteric sclerae, moist conjunctivae; no lid-lag; PERRL, tracking appropriately HENT: NCAT; oropharynx, MMM, no mucosal ulcerations; normal hard and soft palate Neck: Trachea midline; no lymphadenopathy, no JVD Lungs: CTAB, no crackles, no wheeze, with normal respiratory effort CV: RRR, no MRGs  Abdomen: Soft, non-tender; non-distended, BS present  Extremities: No peripheral edema, radial and DP pulses present bilaterally  Skin: Normal temperature, turgor and texture; no rash Psych: Appropriate  affect Neuro: Alert and oriented to person and place, no focal deficit    Vitals:   06/02/21 0959  BP: 138/72  Pulse: (!) 57  Temp: 97.7 F (36.5 C)  TempSrc: Oral  SpO2: 99%  Weight: 93 lb (42.2 kg)  Height: 5\' 4"  (1.626 m)   99% on RA BMI Readings from Last 3 Encounters:  06/02/21 15.96 kg/m  05/18/21 15.97 kg/m  05/12/21 16.82 kg/m   Wt Readings from Last 3 Encounters:  06/02/21 93 lb (42.2 kg)  05/18/21 91 lb 9.6 oz (41.5 kg)  05/12/21 98 lb (44.5 kg)     CBC  Component Value Date/Time   WBC 5.6 05/18/2021 1000   RBC 4.37 05/18/2021 1000   HGB 13.1 05/18/2021 1000   HGB 12.5 09/03/2019 1228   HCT 39.3 05/18/2021 1000   HCT 35.8 09/03/2019 1228   PLT 236.0 05/18/2021 1000   PLT 222 09/03/2019 1228   MCV 89.9 05/18/2021 1000   MCV 87 09/03/2019 1228   MCH 30.4 09/03/2019 1228   MCH 29.1 10/18/2014 1222   MCHC 33.3 05/18/2021 1000   RDW 12.4 05/18/2021 1000   RDW 12.5 09/03/2019 1228   LYMPHSABS 1.8 05/18/2021 1000   LYMPHSABS 1.7 09/03/2019 1228   MONOABS 0.7 05/18/2021 1000   EOSABS 0.4 05/18/2021 1000   EOSABS 0.1 09/03/2019 1228   BASOSABS 0.1 05/18/2021 1000   BASOSABS 0.0 09/03/2019 1228    TSH, Hb,  05/18/21 WNL   Chest Imaging: CT Cardiac 06/2019 reviewed and remarkable for essentially clear lungs, just a bit of bilateral lower lobe scarring similar to that seen on prior CT A/P  CXR 2018 reviewed by me and unremarkable   Pulmonary Functions Testing Results: No flowsheet data found.    Echocardiogram:  TTE 04/2017 with G1DD  LHC 08/2019: 1. Severe 2 vessel obstructive CAD    - 70% LAD proximally at the bifurcation with a very large diagonal branch. Segmental 90% mid LAD stenosis. This vessel is heavily calcified and tortuous    - 90% ostial large first diagonal    - 70% PL branch of the RCA 2. Normal LV function 3. Normal LVEDP    Assessment & Plan:   # Chest tightness: # Dyspnea on exertion: considerations include asthma,  CHF with diastolic dysfunction although she appears very well compensated with no overt evidence of volume overload on exam, chronotropic insufficiency from beta-blocker effect, or perhaps nuke stress missed severe balanced obstructive CAD (although maybe you'd expect to see some repolarization abnormalities on accompanying EKG). Deconditioning seems relatively unlikely given how active she sounds.  # Tree-in-bud nodularity: # Bilateral lower lobe scarring: Could be related to prior or subclinical NTM infection. Low aspiration risk. No constitutional symptoms or bothersome cough. No need for surveillance imaging, low risk for lung CA.  Plan: - PFTs - CXR PA/lateral     Omar Person, MD [facility] 06/02/2021 10:04 AM

## 2021-06-02 ENCOUNTER — Ambulatory Visit: Payer: Medicare PPO | Admitting: Student

## 2021-06-02 ENCOUNTER — Other Ambulatory Visit: Payer: Self-pay

## 2021-06-02 ENCOUNTER — Encounter: Payer: Self-pay | Admitting: Student

## 2021-06-02 VITALS — BP 138/72 | HR 57 | Temp 97.7°F | Ht 64.0 in | Wt 93.0 lb

## 2021-06-02 DIAGNOSIS — R918 Other nonspecific abnormal finding of lung field: Secondary | ICD-10-CM

## 2021-06-02 DIAGNOSIS — R0600 Dyspnea, unspecified: Secondary | ICD-10-CM | POA: Diagnosis not present

## 2021-06-02 DIAGNOSIS — I2510 Atherosclerotic heart disease of native coronary artery without angina pectoris: Secondary | ICD-10-CM | POA: Diagnosis not present

## 2021-06-02 DIAGNOSIS — R0609 Other forms of dyspnea: Secondary | ICD-10-CM

## 2021-06-02 DIAGNOSIS — R0789 Other chest pain: Secondary | ICD-10-CM | POA: Diagnosis not present

## 2021-06-02 NOTE — Patient Instructions (Addendum)
-   We will schedule breathing tests in the next couple of weeks - Chest x-ray today - I will call with results and potentially reach out to your cardiologist if all of our testing looks normal to discuss some aspects of your recent stress test. - See you in 4 months!

## 2021-06-03 ENCOUNTER — Ambulatory Visit (INDEPENDENT_AMBULATORY_CARE_PROVIDER_SITE_OTHER): Payer: Medicare PPO

## 2021-06-03 VITALS — BP 118/70 | HR 60 | Temp 97.5°F | Ht 64.0 in | Wt 92.2 lb

## 2021-06-03 DIAGNOSIS — Z0000 Encounter for general adult medical examination without abnormal findings: Secondary | ICD-10-CM | POA: Diagnosis not present

## 2021-06-03 NOTE — Progress Notes (Addendum)
Subjective:   Brittany Mathis is a 75 y.o. female who presents for Medicare Annual (Subsequent) preventive examination.  Review of Systems     Cardiac Risk Factors include: advanced age (>50men, >33 women);dyslipidemia;hypertension;family history of premature cardiovascular disease     Objective:    Today's Vitals   06/03/21 0954  BP: 118/70  Pulse: 60  Temp: (!) 97.5 F (36.4 C)  SpO2: 99%  Weight: 92 lb 3.2 oz (41.8 kg)  Height: 5\' 4"  (1.626 m)  PainSc: 0-No pain   Body mass index is 15.83 kg/m.  Advanced Directives 06/03/2021 09/05/2019 03/05/2019 10/18/2014  Does Patient Have a Medical Advance Directive? No No Yes No  Type of Advance Directive - 12/17/2014;Living will -  Does patient want to make changes to medical advance directive? No - Patient declined - - -  Copy of Healthcare Power of Attorney in Chart? - - No - copy requested -  Would patient like information on creating a medical advance directive? - No - Patient declined - No - patient declined information    Current Medications (verified) Outpatient Encounter Medications as of 06/03/2021  Medication Sig   alendronate (FOSAMAX) 70 MG tablet Take 1 tablet (70 mg total) by mouth every Tuesday. Take with a full glass of water on an empty stomach.   aspirin EC 81 MG tablet Take 81 mg by mouth daily with lunch.    Cholecalciferol (VITAMIN D) 50 MCG (2000 UT) tablet Take 2,000 Units by mouth daily with lunch.   estradiol (ESTRACE VAGINAL) 0.1 MG/GM vaginal cream Place 1 g vaginally 2 (two) times a week. Initial dose: nightly x 2 weeks, then every other night x 2 weeks, then twice weekly   Evolocumab (REPATHA SURECLICK) 140 MG/ML SOAJ Inject 1 mL into the skin every 14 (fourteen) days.   metoprolol succinate (TOPROL-XL) 25 MG 24 hr tablet Take 1 tablet (25 mg total) by mouth daily.   Propylene Glycol (SYSTANE COMPLETE) 0.6 % SOLN Place 1 drop into both eyes daily.   rosuvastatin (CRESTOR) 40 MG tablet  Take 1 tablet (40 mg total) by mouth daily.   valsartan (DIOVAN) 160 MG tablet Take 1 tablet (160 mg total) by mouth daily.   nitroGLYCERIN (NITROSTAT) 0.4 MG SL tablet Place 1 tablet (0.4 mg total) under the tongue every 5 (five) minutes as needed for chest pain.   Facility-Administered Encounter Medications as of 06/03/2021  Medication   regadenoson (LEXISCAN) injection SOLN 0.4 mg   technetium tetrofosmin (TC-MYOVIEW) injection 29.4 millicurie    Allergies (verified) Patient has no known allergies.   History: Past Medical History:  Diagnosis Date   Carotid artery stenosis    1-39% bilateral by dopplers 11/2019   Cataract    History of shingles    Hyperlipidemia    Hypertension    Nephrolithiasis 1972   Osteoporosis 05/2017   T score -2.7   Thyroid disease 2010   thyroiditis   Past Surgical History:  Procedure Laterality Date   ABDOMINAL HYSTERECTOMY  1983   with BSO  menorrhagia, endometriosis   cataracts Bilateral 2018   LEFT HEART CATH AND CORONARY ANGIOGRAPHY N/A 09/05/2019   Procedure: LEFT HEART CATH AND CORONARY ANGIOGRAPHY;  Surgeon: 09/07/2019, Peter M, MD;  Location: [facility] INVASIVE CV LAB;  Service: Cardiovascular;  Laterality: N/A;   OOPHORECTOMY     BSO   Family History  Problem Relation Age of Onset   Breast cancer Mother    Hypertension Mother  Hyperlipidemia Mother    Stroke Mother    Hypertension Father    Hyperlipidemia Father    Glaucoma Father    Kidney disease Father    Colon cancer Maternal Aunt    Social History   Socioeconomic History   Marital status: Married    Spouse name: Not on file   Number of children: Not on file   Years of education: Not on file   Highest education level: Not on file  Occupational History   Not on file  Tobacco Use   Smoking status: Never   Smokeless tobacco: Never   Tobacco comments:    experimented in college  Vaping Use   Vaping Use: Never used  Substance and Sexual Activity   Alcohol use: Yes     Alcohol/week: 14.0 standard drinks    Types: 14 Standard drinks or equivalent per week   Drug use: No   Sexual activity: Not Currently    Birth control/protection: Surgical    Comment: HYST-1st intercourse 6 yo-1 partner  Other Topics Concern   Not on file  Social History Narrative   Not on file   Social Determinants of Health   Financial Resource Strain: Low Risk    Difficulty of Paying Living Expenses: Not hard at all  Food Insecurity: No Food Insecurity   Worried About Programme researcher, broadcasting/film/video in the Last Year: Never true   Ran Out of Food in the Last Year: Never true  Transportation Needs: No Transportation Needs   Lack of Transportation (Medical): No   Lack of Transportation (Non-Medical): No  Physical Activity: Sufficiently Active   Days of Exercise per Week: 5 days   Minutes of Exercise per Session: 30 min  Stress: No Stress Concern Present   Feeling of Stress : Not at all  Social Connections: Socially Integrated   Frequency of Communication with Friends and Family: More than three times a week   Frequency of Social Gatherings with Friends and Family: Twice a week   Attends Religious Services: More than 4 times per year   Active Member of [employer] or Organizations: Yes   Attends Engineer, structural: More than 4 times per year   Marital Status: Married    Tobacco Counseling Counseling given: Not Answered Tobacco comments: experimented in college   Clinical Intake:  Pre-visit preparation completed: Yes  Pain : No/denies pain Pain Score: 0-No pain     BMI - recorded: 15.83 Nutritional Status: BMI <19  Underweight Nutritional Risks: None Diabetes: No  How often do you need to have someone help you when you read instructions, pamphlets, or other written materials from your doctor or pharmacy?: 1 - Never What is the last grade level you completed in school?: Bachelor's degree; some Grad school  Diabetic? no  Interpreter Needed?: No  Information entered  by :: Susie Cassette, LPN   Activities of Daily Living In your present state of health, do you have any difficulty performing the following activities: 06/03/2021  Hearing? N  Vision? N  Difficulty concentrating or making decisions? N  Walking or climbing stairs? N  Dressing or bathing? N  Doing errands, shopping? N  Preparing Food and eating ? N  Using the Toilet? N  In the past six months, have you accidently leaked urine? N  Do you have problems with loss of bowel control? N  Managing your Medications? N  Managing your Finances? N  Housekeeping or managing your Housekeeping? N  Some recent data might be hidden  Patient Care Team: Plotnikov, Georgina Quint, MD as PCP - General (Internal Medicine) Quintella Reichert, MD as PCP - Cardiology (Cardiology) Sharrell Ku, MD (Gastroenterology) Jerilee Field, MD as Attending Physician (Urology) Donzetta Starch, MD as Consulting Physician (Dermatology) Audie Box, Nadyne Coombes, MD (Inactive) as Consulting Physician (Gynecology) Antony Contras, MD as Consulting Physician (Ophthalmology)  Indicate any recent Medical Services you may have received from other than [facility] providers in the past year (date may be approximate).     Assessment:   This is a routine wellness examination for Brittany Mathis.  Hearing/Vision screen No results found.  Dietary issues and exercise activities discussed: Current Exercise Habits: Home exercise routine, Type of exercise: walking;Other - see comments (walks 2 miles a day), Time (Minutes): 30, Frequency (Times/Week): 7, Weekly Exercise (Minutes/Week): 210, Intensity: Moderate   Goals Addressed               This Visit's Progress     Patient Stated (pt-stated)        My goal is to stay on top of my physical health.      Depression Screen PHQ 2/9 Scores 06/03/2021 11/11/2020 05/11/2020 03/05/2019 08/29/2017 08/21/2016 02/12/2015  PHQ - 2 Score 0 0 0 0 0 0 0    Fall Risk Fall Risk  06/03/2021 11/11/2020 05/11/2020  03/05/2019 08/29/2018  Falls in the past year? 0 0 0 0 0  Number falls in past yr: 0 0 0 0 -  Injury with Fall? 0 0 0 - -  Risk for fall due to : No Fall Risks - - - -  Follow up Falls evaluation completed - - - Falls evaluation completed    FALL RISK PREVENTION PERTAINING TO THE HOME:  Any stairs in or around the home? Yes  If so, are there any without handrails? No  Home free of loose throw rugs in walkways, pet beds, electrical cords, etc? Yes  Adequate lighting in your home to reduce risk of falls? Yes   ASSISTIVE DEVICES UTILIZED TO PREVENT FALLS:  Life alert? No  Use of a cane, walker or w/c? No  Grab bars in the bathroom? No  Shower chair or bench in shower? Yes  Elevated toilet seat or a handicapped toilet? Yes   TIMED UP AND GO:  Was the test performed? Yes .  Length of time to ambulate 10 feet: 7 sec.   Gait steady and fast without use of assistive device  Cognitive Function: Normal cognitive status assessed by direct observation by this Nurse Health Advisor. No abnormalities found.          Immunizations Immunization History  Administered Date(s) Administered   Fluad Quad(high Dose 65+) 07/14/2019, 09/16/2020   Influenza, High Dose Seasonal PF 08/29/2018   PFIZER(Purple Top)SARS-COV-2 Vaccination 10/25/2019, 11/15/2019, 08/17/2020, 05/24/2021   Pneumococcal Conjugate-13 09/24/2013   Pneumococcal Polysaccharide-23 02/05/2012   Td 02/05/2012   Zoster, Live 02/13/2013    TDAP status: Up to date  Flu Vaccine status: Up to date  Pneumococcal vaccine status: Up to date  Covid-19 vaccine status: Completed vaccines  Qualifies for Shingles Vaccine? Yes   Zostavax completed Yes   Shingrix Completed?: No.    Education has been provided regarding the importance of this vaccine. Patient has been advised to call insurance company to determine out of pocket expense if they have not yet received this vaccine. Advised may also receive vaccine at local pharmacy or  Health Dept. Verbalized acceptance and understanding.  Screening Tests Health Maintenance  Topic Date Due  Zoster Vaccines- Shingrix (1 of 2) Never done   INFLUENZA VACCINE  05/16/2021   COVID-19 Vaccine (5 - Booster for Pfizer series) 09/23/2021   TETANUS/TDAP  02/04/2022   MAMMOGRAM  04/27/2022   DEXA SCAN  Completed   Hepatitis C Screening  Completed   PNA vac Low Risk Adult  Completed   HPV VACCINES  Aged Out    Health Maintenance  Health Maintenance Due  Topic Date Due   Zoster Vaccines- Shingrix (1 of 2) Never done   INFLUENZA VACCINE  05/16/2021    Colorectal cancer screening: Type of screening: Colonoscopy. Completed 08/10/2020. Repeat every 5 years  Mammogram status: Completed 04/27/2021. Repeat every year  Bone Density status: Completed 06/08/2020. Results reflect: Bone density results: OSTEOPENIA. Repeat every 2 years.  Lung Cancer Screening: (Low Dose CT Chest recommended if Age 54-80 years, 30 pack-year currently smoking OR have quit w/in 15years.) does not qualify.   Lung Cancer Screening Referral: no  Additional Screening:  Hepatitis C Screening: does qualify; Completed yes  Vision Screening: Recommended annual ophthalmology exams for early detection of glaucoma and other disorders of the eye. Is the patient up to date with their annual eye exam?  Yes  Who is the provider or what is the name of the office in which the patient attends annual eye exams? Antony ContrasGraham Lyles, MD. If pt is not established with a provider, would they like to be referred to a provider to establish care? No .   Dental Screening: Recommended annual dental exams for proper oral hygiene  Community Resource Referral / Chronic Care Management: CRR required this visit?  No   CCM required this visit?  No      Plan:     I have personally reviewed and noted the following in the patient's chart:   Medical and social history Use of alcohol, tobacco or illicit drugs  Current medications  and supplements including opioid prescriptions.  Functional ability and status Nutritional status Physical activity Advanced directives List of other physicians Hospitalizations, surgeries, and ER visits in previous 12 months Vitals Screenings to include cognitive, depression, and falls Referrals and appointments  In addition, I have reviewed and discussed with patient certain preventive protocols, quality metrics, and best practice recommendations. A written personalized care plan for preventive services as well as general preventive health recommendations were provided to patient.     Mickeal NeedyShenika N Siyana Erney, LPN   [id number]/09608/19/2022   Nurse Notes: n/a  Medical screening examination/treatment/procedure(s) were performed by non-physician practitioner and as supervising physician I was immediately available for consultation/collaboration.  I agree with above. Jacinta ShoeAleksei Plotnikov, MD

## 2021-06-03 NOTE — Patient Instructions (Addendum)
Brittany Mathis , Thank you for taking time to come for your Medicare Wellness Visit. I appreciate your ongoing commitment to your health goals. Please review the following plan we discussed and let me know if I can assist you in the future.   Screening recommendations/referrals: Colonoscopy: 08/10/2020; due every 5 years Mammogram: 04/27/2021; due every year Bone Density: 06/08/2020; due every 2 years Recommended yearly ophthalmology/optometry visit for glaucoma screening and checkup Recommended yearly dental visit for hygiene and checkup  Vaccinations: Influenza vaccine: 09/16/2020 Pneumococcal vaccine: 02/05/2012, 09/24/2013 Tdap vaccine: 02/05/2012; due every 10 years Shingles vaccine: Zoster 02/13/2013   Covid-19: 11/04/2019, 11/15/2019, 08/17/2020, 05/24/2021  Advanced directives: Advance directive discussed with you today. Even though you declined this today please call our office should you change your mind and we can give you the proper paperwork for you to fill out.  Conditions/risks identified: Yes; Client understands the importance of follow-up with providers by attending scheduled visits and discussed goals to eat healthier, increase physical activity, exercise the brain, socialize more, get enough sleep and make time for laughter.  Next appointment: Please schedule your next Medicare Wellness Visit with your Nurse Health Advisor in 1 year by calling [phone].   Preventive Care 22 Years and Older, Female Preventive care refers to lifestyle choices and visits with your health care provider that can promote health and wellness. What does preventive care include? A yearly physical exam. This is also called an annual well check. Dental exams once or twice a year. Routine eye exams. Ask your health care provider how often you should have your eyes checked. Personal lifestyle choices, including: Daily care of your teeth and gums. Regular physical activity. Eating a healthy diet. Avoiding  tobacco and drug use. Limiting alcohol use. Practicing safe sex. Taking low-dose aspirin every day. Taking vitamin and mineral supplements as recommended by your health care provider. What happens during an annual well check? The services and screenings done by your health care provider during your annual well check will depend on your age, overall health, lifestyle risk factors, and family history of disease. Counseling  Your health care provider may ask you questions about your: Alcohol use. Tobacco use. Drug use. Emotional well-being. Home and relationship well-being. Sexual activity. Eating habits. History of falls. Memory and ability to understand (cognition). Work and work Astronomer. Reproductive health. Screening  You may have the following tests or measurements: Height, weight, and BMI. Blood pressure. Lipid and cholesterol levels. These may be checked every 5 years, or more frequently if you are over 43 years old. Skin check. Lung cancer screening. You may have this screening every year starting at age 11 if you have a 30-pack-year history of smoking and currently smoke or have quit within the past 15 years. Fecal occult blood test (FOBT) of the stool. You may have this test every year starting at age 67. Flexible sigmoidoscopy or colonoscopy. You may have a sigmoidoscopy every 5 years or a colonoscopy every 10 years starting at age 3. Hepatitis C blood test. Hepatitis B blood test. Sexually transmitted disease (STD) testing. Diabetes screening. This is done by checking your blood sugar (glucose) after you have not eaten for a while (fasting). You may have this done every 1-3 years. Bone density scan. This is done to screen for osteoporosis. You may have this done starting at age 35. Mammogram. This may be done every 1-2 years. Talk to your health care provider about how often you should have regular mammograms. Talk with your health care  provider about your test  results, treatment options, and if necessary, the need for more tests. Vaccines  Your health care provider may recommend certain vaccines, such as: Influenza vaccine. This is recommended every year. Tetanus, diphtheria, and acellular pertussis (Tdap, Td) vaccine. You may need a Td booster every 10 years. Zoster vaccine. You may need this after age 29. Pneumococcal 13-valent conjugate (PCV13) vaccine. One dose is recommended after age 25. Pneumococcal polysaccharide (PPSV23) vaccine. One dose is recommended after age 46. Talk to your health care provider about which screenings and vaccines you need and how often you need them. This information is not intended to replace advice given to you by your health care provider. Make sure you discuss any questions you have with your health care provider. Document Released: 10/29/2015 Document Revised: 06/21/2016 Document Reviewed: 08/03/2015 Elsevier Interactive Patient Education  2017 [employer] Prevention in the Home Falls can cause injuries. They can happen to people of all ages. There are many things you can do to make your home safe and to help prevent falls. What can I do on the outside of my home? Regularly fix the edges of walkways and driveways and fix any cracks. Remove anything that might make you trip as you walk through a door, such as a raised step or threshold. Trim any bushes or trees on the path to your home. Use bright outdoor lighting. Clear any walking paths of anything that might make someone trip, such as rocks or tools. Regularly check to see if handrails are loose or broken. Make sure that both sides of any steps have handrails. Any raised decks and porches should have guardrails on the edges. Have any leaves, snow, or ice cleared regularly. Use sand or salt on walking paths during winter. Clean up any spills in your garage right away. This includes oil or grease spills. What can I do in the bathroom? Use night  lights. Install grab bars by the toilet and in the tub and shower. Do not use towel bars as grab bars. Use non-skid mats or decals in the tub or shower. If you need to sit down in the shower, use a plastic, non-slip stool. Keep the floor dry. Clean up any water that spills on the floor as soon as it happens. Remove soap buildup in the tub or shower regularly. Attach bath mats securely with double-sided non-slip rug tape. Do not have throw rugs and other things on the floor that can make you trip. What can I do in the bedroom? Use night lights. Make sure that you have a light by your bed that is easy to reach. Do not use any sheets or blankets that are too big for your bed. They should not hang down onto the floor. Have a firm chair that has side arms. You can use this for support while you get dressed. Do not have throw rugs and other things on the floor that can make you trip. What can I do in the kitchen? Clean up any spills right away. Avoid walking on wet floors. Keep items that you use a lot in easy-to-reach places. If you need to reach something above you, use a strong step stool that has a grab bar. Keep electrical cords out of the way. Do not use floor polish or wax that makes floors slippery. If you must use wax, use non-skid floor wax. Do not have throw rugs and other things on the floor that can make you trip. What can I  do with my stairs? Do not leave any items on the stairs. Make sure that there are handrails on both sides of the stairs and use them. Fix handrails that are broken or loose. Make sure that handrails are as long as the stairways. Check any carpeting to make sure that it is firmly attached to the stairs. Fix any carpet that is loose or worn. Avoid having throw rugs at the top or bottom of the stairs. If you do have throw rugs, attach them to the floor with carpet tape. Make sure that you have a light switch at the top of the stairs and the bottom of the stairs. If  you do not have them, ask someone to add them for you. What else can I do to help prevent falls? Wear shoes that: Do not have high heels. Have rubber bottoms. Are comfortable and fit you well. Are closed at the toe. Do not wear sandals. If you use a stepladder: Make sure that it is fully opened. Do not climb a closed stepladder. Make sure that both sides of the stepladder are locked into place. Ask someone to hold it for you, if possible. Clearly mark and make sure that you can see: Any grab bars or handrails. First and last steps. Where the edge of each step is. Use tools that help you move around (mobility aids) if they are needed. These include: Canes. Walkers. Scooters. Crutches. Turn on the lights when you go into a dark area. Replace any light bulbs as soon as they burn out. Set up your furniture so you have a clear path. Avoid moving your furniture around. If any of your floors are uneven, fix them. If there are any pets around you, be aware of where they are. Review your medicines with your doctor. Some medicines can make you feel dizzy. This can increase your chance of falling. Ask your doctor what other things that you can do to help prevent falls. This information is not intended to replace advice given to you by your health care provider. Make sure you discuss any questions you have with your health care provider. Document Released: 07/29/2009 Document Revised: 03/09/2016 Document Reviewed: 11/06/2014 Elsevier Interactive Patient Education  2017 [employer].

## 2021-06-08 ENCOUNTER — Other Ambulatory Visit: Payer: Self-pay

## 2021-06-08 ENCOUNTER — Ambulatory Visit (INDEPENDENT_AMBULATORY_CARE_PROVIDER_SITE_OTHER): Payer: Medicare PPO | Admitting: Student

## 2021-06-08 ENCOUNTER — Ambulatory Visit (INDEPENDENT_AMBULATORY_CARE_PROVIDER_SITE_OTHER): Payer: Medicare PPO

## 2021-06-08 DIAGNOSIS — R0600 Dyspnea, unspecified: Secondary | ICD-10-CM

## 2021-06-08 DIAGNOSIS — R0789 Other chest pain: Secondary | ICD-10-CM | POA: Diagnosis not present

## 2021-06-08 DIAGNOSIS — R0609 Other forms of dyspnea: Secondary | ICD-10-CM

## 2021-06-08 NOTE — Progress Notes (Signed)
Full PFT performed today. °

## 2021-06-08 NOTE — Patient Instructions (Signed)
Full PFT performed today. °

## 2021-06-10 LAB — PULMONARY FUNCTION TEST
DL/VA % pred: 108 %
DL/VA: 4.45 ml/min/mmHg/L
DLCO cor % pred: 94 %
DLCO cor: 17.97 ml/min/mmHg
DLCO unc % pred: 93 %
DLCO unc: 17.81 ml/min/mmHg
FEF 25-75 Post: 1.77 L/sec
FEF 25-75 Pre: 1.32 L/sec
FEF2575-%Change-Post: 34 %
FEF2575-%Pred-Post: 110 %
FEF2575-%Pred-Pre: 82 %
FEV1-%Change-Post: 5 %
FEV1-%Pred-Post: 76 %
FEV1-%Pred-Pre: 72 %
FEV1-Post: 1.59 L
FEV1-Pre: 1.5 L
FEV1FVC-%Change-Post: 4 %
FEV1FVC-%Pred-Pre: 105 %
FEV6-%Change-Post: 1 %
FEV6-%Pred-Post: 73 %
FEV6-%Pred-Pre: 71 %
FEV6-Post: 1.93 L
FEV6-Pre: 1.89 L
FEV6FVC-%Pred-Post: 105 %
FEV6FVC-%Pred-Pre: 105 %
FVC-%Change-Post: 1 %
FVC-%Pred-Post: 69 %
FVC-%Pred-Pre: 68 %
FVC-Post: 1.93 L
FVC-Pre: 1.9 L
Post FEV1/FVC ratio: 83 %
Post FEV6/FVC ratio: 100 %
Pre FEV1/FVC ratio: 79 %
Pre FEV6/FVC Ratio: 100 %
RV % pred: 88 %
RV: 2.04 L
TLC % pred: 100 %
TLC: 5.06 L

## 2021-06-14 ENCOUNTER — Telehealth: Payer: Self-pay | Admitting: Student

## 2021-06-14 NOTE — Telephone Encounter (Signed)
Noted.   NM please advise of PFT results. Thanks  

## 2021-06-15 NOTE — Telephone Encounter (Signed)
Attempted to call to discuss PFT results. Left voicemail. I actually reviewed the results again and I do think she actually has obstructive lung disease based on reduced FEV1/SVC ratio (I had missed this when I read the PFT in [place]). Severity is mild. Unsure if it represents effect of longstanding and unnoticed asthma or if there's some other underlying etiology since she's a nonsmoker. I'd like to discuss results with her to see if she'd be willing to try an inhaler or if she'd be interested in pursuing methacholine challenge.

## 2021-06-16 ENCOUNTER — Telehealth: Payer: Self-pay | Admitting: Student

## 2021-06-16 MED ORDER — ADVAIR HFA 115-21 MCG/ACT IN AERO: 2.0000 | INHALATION_SPRAY | Freq: Two times a day (BID) | RESPIRATORY_TRACT | 12 refills | Status: DC

## 2021-06-16 NOTE — Telephone Encounter (Signed)
Spoke with the pt  I notified her of the below results per Dr Thora Lance  She states she does not want to make a decision on MCT or inhaler until she talks with Dr Thora Lance Pt can be reached at her home number [phone]  Thanks!

## 2021-06-16 NOTE — Telephone Encounter (Signed)
Discussed PFT results, she'd like to try an inhaler for her mild obstructive lung disease of unclear etiology. Would start with trying ICS/LABA (in case this was longstanding unrecognized asthma vs other form of bronchiolitis). Will start with advair.

## 2021-06-21 DIAGNOSIS — M810 Age-related osteoporosis without current pathological fracture: Secondary | ICD-10-CM | POA: Diagnosis not present

## 2021-06-21 DIAGNOSIS — N952 Postmenopausal atrophic vaginitis: Secondary | ICD-10-CM | POA: Diagnosis not present

## 2021-06-21 DIAGNOSIS — I25119 Atherosclerotic heart disease of native coronary artery with unspecified angina pectoris: Secondary | ICD-10-CM | POA: Diagnosis not present

## 2021-06-21 DIAGNOSIS — Z7982 Long term (current) use of aspirin: Secondary | ICD-10-CM | POA: Diagnosis not present

## 2021-06-21 DIAGNOSIS — E785 Hyperlipidemia, unspecified: Secondary | ICD-10-CM | POA: Diagnosis not present

## 2021-06-21 DIAGNOSIS — R636 Underweight: Secondary | ICD-10-CM | POA: Diagnosis not present

## 2021-06-21 DIAGNOSIS — Z681 Body mass index (BMI) 19 or less, adult: Secondary | ICD-10-CM | POA: Diagnosis not present

## 2021-06-21 DIAGNOSIS — I739 Peripheral vascular disease, unspecified: Secondary | ICD-10-CM | POA: Diagnosis not present

## 2021-06-21 DIAGNOSIS — I10 Essential (primary) hypertension: Secondary | ICD-10-CM | POA: Diagnosis not present

## 2021-07-08 DIAGNOSIS — H40053 Ocular hypertension, bilateral: Secondary | ICD-10-CM | POA: Diagnosis not present

## 2021-07-08 DIAGNOSIS — H52203 Unspecified astigmatism, bilateral: Secondary | ICD-10-CM | POA: Diagnosis not present

## 2021-07-08 DIAGNOSIS — H26492 Other secondary cataract, left eye: Secondary | ICD-10-CM | POA: Diagnosis not present

## 2021-07-08 DIAGNOSIS — Z961 Presence of intraocular lens: Secondary | ICD-10-CM | POA: Diagnosis not present

## 2021-08-01 ENCOUNTER — Other Ambulatory Visit: Payer: Self-pay | Admitting: Cardiology

## 2021-08-01 DIAGNOSIS — I2510 Atherosclerotic heart disease of native coronary artery without angina pectoris: Secondary | ICD-10-CM

## 2021-08-01 DIAGNOSIS — E785 Hyperlipidemia, unspecified: Secondary | ICD-10-CM

## 2021-08-02 DIAGNOSIS — Z85828 Personal history of other malignant neoplasm of skin: Secondary | ICD-10-CM | POA: Diagnosis not present

## 2021-08-02 DIAGNOSIS — L814 Other melanin hyperpigmentation: Secondary | ICD-10-CM | POA: Diagnosis not present

## 2021-08-02 DIAGNOSIS — L820 Inflamed seborrheic keratosis: Secondary | ICD-10-CM | POA: Diagnosis not present

## 2021-08-02 DIAGNOSIS — L309 Dermatitis, unspecified: Secondary | ICD-10-CM | POA: Diagnosis not present

## 2021-08-02 DIAGNOSIS — D225 Melanocytic nevi of trunk: Secondary | ICD-10-CM | POA: Diagnosis not present

## 2021-08-02 DIAGNOSIS — L821 Other seborrheic keratosis: Secondary | ICD-10-CM | POA: Diagnosis not present

## 2021-08-02 DIAGNOSIS — D1801 Hemangioma of skin and subcutaneous tissue: Secondary | ICD-10-CM | POA: Diagnosis not present

## 2021-08-02 DIAGNOSIS — L570 Actinic keratosis: Secondary | ICD-10-CM | POA: Diagnosis not present

## 2021-08-04 DIAGNOSIS — H40053 Ocular hypertension, bilateral: Secondary | ICD-10-CM | POA: Diagnosis not present

## 2021-08-24 ENCOUNTER — Encounter: Payer: Self-pay | Admitting: Internal Medicine

## 2021-08-24 ENCOUNTER — Ambulatory Visit: Payer: Medicare PPO | Admitting: Internal Medicine

## 2021-08-24 ENCOUNTER — Other Ambulatory Visit: Payer: Self-pay

## 2021-08-24 VITALS — BP 120/80 | HR 58 | Temp 97.7°F | Ht 64.0 in | Wt 96.2 lb

## 2021-08-24 DIAGNOSIS — E871 Hypo-osmolality and hyponatremia: Secondary | ICD-10-CM

## 2021-08-24 DIAGNOSIS — I2510 Atherosclerotic heart disease of native coronary artery without angina pectoris: Secondary | ICD-10-CM

## 2021-08-24 DIAGNOSIS — E875 Hyperkalemia: Secondary | ICD-10-CM

## 2021-08-24 DIAGNOSIS — R7989 Other specified abnormal findings of blood chemistry: Secondary | ICD-10-CM

## 2021-08-24 DIAGNOSIS — I700 Atherosclerosis of aorta: Secondary | ICD-10-CM | POA: Diagnosis not present

## 2021-08-24 DIAGNOSIS — Z23 Encounter for immunization: Secondary | ICD-10-CM

## 2021-08-24 LAB — COMPREHENSIVE METABOLIC PANEL
ALT: 49 U/L — ABNORMAL HIGH (ref 0–35)
AST: 57 U/L — ABNORMAL HIGH (ref 0–37)
Albumin: 4.5 g/dL (ref 3.5–5.2)
Alkaline Phosphatase: 49 U/L (ref 39–117)
BUN: 16 mg/dL (ref 6–23)
CO2: 30 mEq/L (ref 19–32)
Calcium: 9.2 mg/dL (ref 8.4–10.5)
Chloride: 96 mEq/L (ref 96–112)
Creatinine, Ser: 0.6 mg/dL (ref 0.40–1.20)
GFR: 86.9 mL/min (ref >60.00)
Glucose, Bld: 101 mg/dL — ABNORMAL HIGH (ref 70–99)
Potassium: 4.9 mEq/L (ref 3.5–5.1)
Sodium: 131 mEq/L — ABNORMAL LOW (ref 135–145)
Total Bilirubin: 0.6 mg/dL (ref 0.2–1.2)
Total Protein: 7.3 g/dL (ref 6.0–8.3)

## 2021-08-24 NOTE — Assessment & Plan Note (Addendum)
Continue on Crestor and Repatha 

## 2021-08-24 NOTE — Progress Notes (Signed)
Subjective:  Patient ID: Brittany Mathis, female    DOB: 09/19/1946  Age: 75 y.o. MRN: [id number]  CC: No chief complaint on file.   HPI Brittany Mathis presents for HTN, CAD, dyslipidemia; stress A lot of stress with and his mother who is suffering with dementia and breast cancer under hospice  Outpatient Medications Prior to Visit  Medication Sig Dispense Refill   alendronate (FOSAMAX) 70 MG tablet Take 1 tablet (70 mg total) by mouth every Tuesday. Take with a full glass of water on an empty stomach. 12 tablet 4   aspirin EC 81 MG tablet Take 81 mg by mouth daily with lunch.      Cholecalciferol (VITAMIN D) 50 MCG (2000 UT) tablet Take 2,000 Units by mouth daily with lunch.     estradiol (ESTRACE VAGINAL) 0.1 MG/GM vaginal cream Place 1 g vaginally 2 (two) times a week. Initial dose: nightly x 2 weeks, then every other night x 2 weeks, then twice weekly 42.5 g 12   Evolocumab (REPATHA SURECLICK) 003 MG/ML SOAJ ADMINISTER 1 ML UNDER THE SKIN EVERY 14 DAYS 6 mL 3   fluticasone-salmeterol (ADVAIR HFA) 115-21 MCG/ACT inhaler Inhale 2 puffs into the lungs 2 (two) times daily. 1 each 12   metoprolol succinate (TOPROL-XL) 25 MG 24 hr tablet Take 1 tablet (25 mg total) by mouth daily. 90 tablet 3   Propylene Glycol (SYSTANE COMPLETE) 0.6 % SOLN Place 1 drop into both eyes daily.     rosuvastatin (CRESTOR) 40 MG tablet Take 1 tablet (40 mg total) by mouth daily. 90 tablet 3   valsartan (DIOVAN) 160 MG tablet Take 1 tablet (160 mg total) by mouth daily. 90 tablet 3   nitroGLYCERIN (NITROSTAT) 0.4 MG SL tablet Place 1 tablet (0.4 mg total) under the tongue every 5 (five) minutes as needed for chest pain. 25 tablet 3   Facility-Administered Medications Prior to Visit  Medication Dose Route Frequency Provider Last Rate Last Admin   regadenoson (LEXISCAN) injection SOLN 0.4 mg  0.4 mg Intravenous Once Skeet Latch, MD       technetium tetrofosmin (TC-MYOVIEW) injection 70.4 millicurie  88.8  millicurie Intravenous Once PRN Skeet Latch, MD        ROS: Review of Systems  Constitutional:  Positive for fatigue. Negative for activity change, appetite change, chills and unexpected weight change.  HENT:  Negative for congestion, mouth sores and sinus pressure.   Eyes:  Negative for visual disturbance.  Respiratory:  Negative for cough and chest tightness.   Gastrointestinal:  Negative for abdominal pain and nausea.  Genitourinary:  Negative for difficulty urinating, frequency and vaginal pain.  Musculoskeletal:  Negative for back pain and gait problem.  Skin:  Negative for pallor and rash.  Neurological:  Negative for dizziness, tremors, weakness, numbness and headaches.  Psychiatric/Behavioral:  Negative for confusion and sleep disturbance.    Objective:  BP 120/80 (BP Location: Left Arm, Patient Position: Sitting, Cuff Size: Large)   Pulse (!) 58   Temp 97.7 F (36.5 C) (Oral)   Ht '5\' 4"'  (1.626 m)   Wt 96 lb 4 oz (43.7 kg)   SpO2 97%   BMI 16.52 kg/m   BP Readings from Last 3 Encounters:  08/24/21 120/80  06/03/21 118/70  06/02/21 138/72    Wt Readings from Last 3 Encounters:  08/24/21 96 lb 4 oz (43.7 kg)  06/03/21 92 lb 3.2 oz (41.8 kg)  06/02/21 93 lb (42.2 kg)    Physical Exam  Constitutional:      General: She is not in acute distress.    Appearance: She is well-developed. She is not ill-appearing.  HENT:     Head: Normocephalic.     Right Ear: External ear normal.     Left Ear: External ear normal.     Nose: Nose normal.  Eyes:     General:        Right eye: No discharge.        Left eye: No discharge.     Conjunctiva/sclera: Conjunctivae normal.     Pupils: Pupils are equal, round, and reactive to light.  Neck:     Thyroid: No thyromegaly.     Vascular: No JVD.     Trachea: No tracheal deviation.  Cardiovascular:     Rate and Rhythm: Normal rate and regular rhythm.     Heart sounds: Normal heart sounds.  Pulmonary:     Effort: No  respiratory distress.     Breath sounds: No stridor. No wheezing.  Abdominal:     General: Bowel sounds are normal. There is no distension.     Palpations: Abdomen is soft. There is no mass.     Tenderness: There is no abdominal tenderness. There is no guarding or rebound.  Musculoskeletal:        General: No tenderness.     Cervical back: Normal range of motion and neck supple. No rigidity.  Lymphadenopathy:     Cervical: No cervical adenopathy.  Skin:    Findings: No erythema or rash.  Neurological:     Cranial Nerves: No cranial nerve deficit.     Motor: No abnormal muscle tone.     Coordination: Coordination normal.     Deep Tendon Reflexes: Reflexes normal.  Psychiatric:        Behavior: Behavior normal.        Thought Content: Thought content normal.        Judgment: Judgment normal.  Thin  Lab Results  Component Value Date   WBC 5.6 05/18/2021   HGB 13.1 05/18/2021   HCT 39.3 05/18/2021   PLT 236.0 05/18/2021   GLUCOSE 101 (H) 08/24/2021   CHOL 164 05/18/2021   TRIG 46.0 05/18/2021   HDL 93.00 05/18/2021   LDLDIRECT 109.2 02/05/2012   LDLCALC 62 05/18/2021   ALT 49 (H) 08/24/2021   AST 57 (H) 08/24/2021   NA 131 (L) 08/24/2021   K 4.9 08/24/2021   CL 96 08/24/2021   CREATININE 0.60 08/24/2021   BUN 16 08/24/2021   CO2 30 08/24/2021   TSH 1.97 05/18/2021    VAS US CAROTID  Result Date: 11/19/2019 Carotid Arterial Duplex Study Indications:  Bilateral bruits. Patient denies any cerebrovascular symptoms, but               says she does have a strong family history of cardiac diseaes and               an elevated coronary calcium score. Risk Factors: Hypertension, hyperlipidemia, no history of smoking. Performing Technologist: Mariane Masters RVT  Examination Guidelines: A complete evaluation includes B-mode imaging, spectral Doppler, color Doppler, and power Doppler as needed of all accessible portions of each vessel. Bilateral testing is considered an integral  part of a complete examination. Limited examinations for reoccurring indications may be performed as noted.  Right Carotid Findings: +----------+--------+--------+--------+------------------+--------+           PSV cm/sEDV cm/sStenosisPlaque DescriptionComments +----------+--------+--------+--------+------------------+--------+ CCA Prox  74  15                                         +----------+--------+--------+--------+------------------+--------+ CCA Distal61      13                                         +----------+--------+--------+--------+------------------+--------+ ICA Prox  54      14      1-39%   heterogenous               +----------+--------+--------+--------+------------------+--------+ ICA Mid   52      18                                         +----------+--------+--------+--------+------------------+--------+ ICA Distal108     30                                         +----------+--------+--------+--------+------------------+--------+ ECA       48      7                                          +----------+--------+--------+--------+------------------+--------+ +----------+--------+-------+----------------+-------------------+           PSV cm/sEDV cmsDescribe        Arm Pressure (mmHG) +----------+--------+-------+----------------+-------------------+ [id number]            Multiphasic, [id number]                 +----------+--------+-------+----------------+-------------------+ +---------+--------+--+--------+--+---------+ VertebralPSV cm/s70EDV cm/s17Antegrade +---------+--------+--+--------+--+---------+  Left Carotid Findings: +----------+--------+--------+--------+------------------+--------+           PSV cm/sEDV cm/sStenosisPlaque DescriptionComments +----------+--------+--------+--------+------------------+--------+ CCA Prox  60      14                                          +----------+--------+--------+--------+------------------+--------+ CCA Distal54      16                                         +----------+--------+--------+--------+------------------+--------+ ICA Prox  60      18              heterogenous               +----------+--------+--------+--------+------------------+--------+ ICA Mid   87      26      1-39%                              +----------+--------+--------+--------+------------------+--------+ ICA Distal89      34                                         +----------+--------+--------+--------+------------------+--------+ ECA  63      5                                          +----------+--------+--------+--------+------------------+--------+ +----------+--------+--------+----------------+-------------------+           PSV cm/sEDV cm/sDescribe        Arm Pressure (mmHG) +----------+--------+--------+----------------+-------------------+ [id number]             Multiphasic, [id number]                 +----------+--------+--------+----------------+-------------------+ +---------+--------+--+--------+-+---------+ VertebralPSV cm/s23EDV cm/s7Antegrade +---------+--------+--+--------+-+---------+   Summary: Right Carotid: Velocities in the right ICA are consistent with a 1-39% stenosis. Left Carotid: Velocities in the left ICA are consistent with a 1-39% stenosis. Vertebrals:  Bilateral vertebral arteries demonstrate antegrade flow. Subclavians: Normal flow hemodynamics were seen in bilateral subclavian              arteries. *See table(s) above for measurements and observations.  Electronically signed by Larae Grooms MD on 11/19/2019 at 11:50:52 AM.    Final     Assessment & Plan:   Problem List Items Addressed This Visit     Atherosclerosis of aorta ([place])    Continue on Crestor and Repatha      CAD (coronary artery disease)    Continue on Crestor and Repatha      Elevated LFTs    Worse.   Brittany Mathis needs to reduce wine drinking.  No Tylenol.  Crestor may be contributing      Hyperkalemia    Obtain c-Met      Hyponatremia   Other Visit Diagnoses     Need for influenza vaccination    -  Primary   Relevant Orders   Flu Vaccine QUAD High Dose(Fluad) (Completed)         No orders of the defined types were placed in this encounter.     Follow-up: Return in about 3 months (around 11/24/2021) for a follow-up visit.  Walker Kehr, MD

## 2021-08-28 NOTE — Assessment & Plan Note (Addendum)
Worse.  Javonna needs to reduce wine drinking.  No Tylenol.  Crestor may be contributing

## 2021-08-28 NOTE — Assessment & Plan Note (Signed)
Continue on Crestor and Repatha 

## 2021-08-28 NOTE — Assessment & Plan Note (Signed)
Obtain c-Met 

## 2021-09-19 NOTE — Progress Notes (Signed)
Synopsis: Referred for dyspnea by Plotnikov, Georgina Quint, MD  Subjective:   PATIENT ID: Brittany Mathis GENDER: female DOB: Sep 26, 1946, MRN: [id number]  Chief Complaint  Patient presents with   Follow-up    Pt states no concerns     74yF with history of never smoker, HTN, severe 2 vessel CAD based on cath in 2020 not candidate for PCI who was referred from cardiology for persistent dyspnea after negative pharmacologic nuke stress.   She says that Dr. Posey Rea had referred her for CAC testing, then got plugged in with cardiologist and got LHC, discovered severe multivessel disease as above. Is on a lot of CAD directed medications.   She says currently when she exerts herself or when she's exposed to high heat and humidity develops chest tightness, central. If she goes to a quiet room, drinks a glass of cold water it goes away, emphasizes that rest is key. Never has tried NG for it. Hasn't tried any inhalers, never had asthma as a kid. She actually hasn't had an episode in 2-3 weeks since temperature has come down and she's attenuated her outdoor activities. No accompanying throat tightness. She notices no substantial associated cough. THere may be mild component of post-nasal drip. She notices no trouble with GERD. No orthopnea.  She was a high school english/middle school teacher for 30 years, retired in 2009. In [employer] system. No significant toxic exposures. She has no pets including no birds. She did live briefly in [place] for 2 years.   She has no allergies to medications  She has no family history of lung disease.   Interval HPI: PFTs showed mild obstruction, prescribed advair 115 hfa 2 puffs BID.  Mother died in 2023/09/24.   She has similar level of DOE with strenuous exertion. Never did try inhaler that I prescribed.   Occasional allergy cough.   Otherwise pertinent review of systems is negative.  Past Medical History:  Diagnosis Date   Carotid artery stenosis    1-39%  bilateral by dopplers 11/2019   Cataract    History of shingles    Hyperlipidemia    Hypertension    Nephrolithiasis 1972   Osteoporosis 05/2017   T score -2.7   Thyroid disease 2010   thyroiditis     Family History  Problem Relation Age of Onset   Breast cancer Mother    Hypertension Mother    Hyperlipidemia Mother    Stroke Mother    Hypertension Father    Hyperlipidemia Father    Glaucoma Father    Kidney disease Father    Colon cancer Maternal Aunt      Past Surgical History:  Procedure Laterality Date   ABDOMINAL HYSTERECTOMY  1983   with BSO  menorrhagia, endometriosis   cataracts Bilateral 2018   LEFT HEART CATH AND CORONARY ANGIOGRAPHY N/A 09/05/2019   Procedure: LEFT HEART CATH AND CORONARY ANGIOGRAPHY;  Surgeon: [place], Peter M, MD;  Location: [facility];  Service: Cardiovascular;  Laterality: N/A;   OOPHORECTOMY     BSO    Social History   Socioeconomic History   Marital status: Married    Spouse name: Not on file   Number of children: Not on file   Years of education: Not on file   Highest education level: Not on file  Occupational History   Not on file  Tobacco Use   Smoking status: Never   Smokeless tobacco: Never   Tobacco comments:    experimented in college  Vaping Use   Vaping Use: Never used  Substance and Sexual Activity   Alcohol use: Yes    Alcohol/week: 14.0 standard drinks    Types: 14 Standard drinks or equivalent per week   Drug use: No   Sexual activity: Not Currently    Birth control/protection: Surgical    Comment: HYST-1st intercourse 89 yo-1 partner  Other Topics Concern   Not on file  Social History Narrative   Not on file   Social Determinants of Health   Financial Resource Strain: Low Risk    Difficulty of Paying Living Expenses: Not hard at all  Food Insecurity: No Food Insecurity   Worried About Programme researcher, broadcasting/film/video in the Last Year: Never true   Ran Out of Food in the Last Year: Never true   Transportation Needs: No Transportation Needs   Lack of Transportation (Medical): No   Lack of Transportation (Non-Medical): No  Physical Activity: Sufficiently Active   Days of Exercise per Week: 5 days   Minutes of Exercise per Session: 30 min  Stress: No Stress Concern Present   Feeling of Stress : Not at all  Social Connections: Socially Integrated   Frequency of Communication with Friends and Family: More than three times a week   Frequency of Social Gatherings with Friends and Family: Twice a week   Attends Religious Services: More than 4 times per year   Active Member of [employer] or Organizations: Yes   Attends Engineer, structural: More than 4 times per year   Marital Status: Married  Catering manager Violence: Not At Risk   Fear of Current or Ex-Partner: No   Emotionally Abused: No   Physically Abused: No   Sexually Abused: No     No Known Allergies   Outpatient Medications Prior to Visit  Medication Sig Dispense Refill   alendronate (FOSAMAX) 70 MG tablet Take 1 tablet (70 mg total) by mouth every Tuesday. Take with a full glass of water on an empty stomach. 12 tablet 4   aspirin EC 81 MG tablet Take 81 mg by mouth daily with lunch.      Cholecalciferol (VITAMIN D) 50 MCG (2000 UT) tablet Take 2,000 Units by mouth daily with lunch.     estradiol (ESTRACE VAGINAL) 0.1 MG/GM vaginal cream Place 1 g vaginally 2 (two) times a week. Initial dose: nightly x 2 weeks, then every other night x 2 weeks, then twice weekly 42.5 g 12   Evolocumab (REPATHA SURECLICK) 140 MG/ML SOAJ ADMINISTER 1 ML UNDER THE SKIN EVERY 14 DAYS 6 mL 3   fluticasone-salmeterol (ADVAIR HFA) 115-21 MCG/ACT inhaler Inhale 2 puffs into the lungs 2 (two) times daily. 1 each 12   metoprolol succinate (TOPROL-XL) 25 MG 24 hr tablet Take 1 tablet (25 mg total) by mouth daily. 90 tablet 3   Propylene Glycol (SYSTANE COMPLETE) 0.6 % SOLN Place 1 drop into both eyes daily.     rosuvastatin (CRESTOR) 40 MG  tablet Take 1 tablet (40 mg total) by mouth daily. 90 tablet 3   valsartan (DIOVAN) 160 MG tablet Take 1 tablet (160 mg total) by mouth daily. 90 tablet 3   nitroGLYCERIN (NITROSTAT) 0.4 MG SL tablet Place 1 tablet (0.4 mg total) under the tongue every 5 (five) minutes as needed for chest pain. 25 tablet 3   Facility-Administered Medications Prior to Visit  Medication Dose Route Frequency Provider Last Rate Last Admin   regadenoson (LEXISCAN) injection SOLN 0.4 mg  0.4 mg Intravenous  Once Chilton Si, MD       technetium tetrofosmin (TC-MYOVIEW) injection 29.4 millicurie  29.4 millicurie Intravenous Once PRN Chilton Si, MD           Objective:   Physical Exam:  General appearance: 75 y.o., female, NAD, conversant  Eyes: anicteric sclerae, moist conjunctivae; no lid-lag; PERRL, tracking appropriately HENT: NCAT; oropharynx, MMM, no mucosal ulcerations; normal hard and soft palate Neck: Trachea midline; no lymphadenopathy, no JVD Lungs: CTAB, no crackles, no wheeze, with normal respiratory effort CV: RRR, no MRGs  Abdomen: Soft, non-tender; non-distended, BS present  Extremities: No peripheral edema, radial and DP pulses present bilaterally  Skin: Normal temperature, turgor and texture; no rash Psych: Appropriate affect Neuro: Alert and oriented to person and place, no focal deficit    Vitals:   09/20/21 1015  BP: 140/72  Pulse: 60  Temp: 97.6 F (36.4 C)  TempSrc: Oral  SpO2: 99%  Weight: 98 lb 6.4 oz (44.6 kg)  Height: 5\' 4"  (1.626 m)    99% on RA BMI Readings from Last 3 Encounters:  09/20/21 16.89 kg/m  08/24/21 16.52 kg/m  06/03/21 15.83 kg/m   Wt Readings from Last 3 Encounters:  09/20/21 98 lb 6.4 oz (44.6 kg)  08/24/21 96 lb 4 oz (43.7 kg)  06/03/21 92 lb 3.2 oz (41.8 kg)     CBC    Component Value Date/Time   WBC 5.6 05/18/2021 1000   RBC 4.37 05/18/2021 1000   HGB 13.1 05/18/2021 1000   HGB 12.5 09/03/2019 1228   HCT 39.3  05/18/2021 1000   HCT 35.8 09/03/2019 1228   PLT 236.0 05/18/2021 1000   PLT 222 09/03/2019 1228   MCV 89.9 05/18/2021 1000   MCV 87 09/03/2019 1228   MCH 30.4 09/03/2019 1228   MCH 29.1 10/18/2014 1222   MCHC 33.3 05/18/2021 1000   RDW 12.4 05/18/2021 1000   RDW 12.5 09/03/2019 1228   LYMPHSABS 1.8 05/18/2021 1000   LYMPHSABS 1.7 09/03/2019 1228   MONOABS 0.7 05/18/2021 1000   EOSABS 0.4 05/18/2021 1000   EOSABS 0.1 09/03/2019 1228   BASOSABS 0.1 05/18/2021 1000   BASOSABS 0.0 09/03/2019 1228    TSH, Hb,  05/18/21 WNL  Eos 200-400 historically  Chest Imaging: CT Cardiac 06/2019 reviewed and remarkable for essentially clear lungs, just a bit of bilateral lower lobe scarring similar to that seen on prior CT A/P  CXR 2018 reviewed by me and unremarkable  CXR 06/08/21 reviewed by me unremarkable   Pulmonary Functions Testing Results: PFT Results Latest Ref Rng & Units 06/08/2021  FVC-Pre L 1.90  FVC-Predicted Pre % 68  FVC-Post L 1.93  FVC-Predicted Post % 69  Pre FEV1/FVC % % 79  Post FEV1/FCV % % 83  FEV1-Pre L 1.50  FEV1-Predicted Pre % 72  FEV1-Post L 1.59  DLCO uncorrected ml/min/mmHg 17.81  DLCO UNC% % 93  DLCO corrected ml/min/mmHg 17.97  DLCO COR %Predicted % 94  DLVA Predicted % 108  TLC L 5.06  TLC % Predicted % 100  RV % Predicted % 88   FEV1/SVC 50%, obstruction is mild by post BD FEV1, no BD effect. Normal DLCO.   Echocardiogram:  TTE 04/2017 with G1DD  LHC 08/2019: 1. Severe 2 vessel obstructive CAD    - 70% LAD proximally at the bifurcation with a very large diagonal branch. Segmental 90% mid LAD stenosis. This vessel is heavily calcified and tortuous    - 90% ostial large first diagonal    -  70% PL branch of the RCA 2. Normal LV function 3. Normal LVEDP    Assessment & Plan:   # Mild obstructive lung disease: # Possible mild intermittent asthma No smoking/smoke exposure concerning for COPD, no underlying conditions/medications raising  concern for BO, DIPNECH but CT not clearly suggestive of it. Symptom burden is very mild.   # Chest tightness: # Dyspnea on exertion:  Considerations include obstructive lung disease (whether asthma, CHF with diastolic dysfunction although she appears very well compensated with no overt evidence of volume overload on exam, chronotropic insufficiency from beta-blocker effect, or perhaps nuke stress missed severe balanced obstructive CAD (although maybe you'd expect to see some repolarization abnormalities on accompanying EKG). Deconditioning seems relatively unlikely given how active she sounds.  # Tree-in-bud nodularity: # Bilateral lower lobe scarring: Could be related to prior or subclinical NTM infection. Low aspiration risk. No constitutional symptoms or bothersome cough. No need for surveillance imaging, low risk for lung CA.  Plan: - continue advair hfa 115 2 puffs BID as needed in anticipation of heavy exertional activity, add spacer, rinse mouth and spacer after use  RTC 6 months     Omar Person, MD [facility] 09/20/2021 10:19 AM

## 2021-09-20 ENCOUNTER — Encounter: Payer: Self-pay | Admitting: Student

## 2021-09-20 ENCOUNTER — Other Ambulatory Visit: Payer: Self-pay

## 2021-09-20 ENCOUNTER — Ambulatory Visit: Payer: Medicare PPO | Admitting: Student

## 2021-09-20 VITALS — BP 140/72 | HR 60 | Temp 97.6°F | Ht 64.0 in | Wt 98.4 lb

## 2021-09-20 DIAGNOSIS — J449 Chronic obstructive pulmonary disease, unspecified: Secondary | ICD-10-CM | POA: Diagnosis not present

## 2021-09-20 DIAGNOSIS — R0609 Other forms of dyspnea: Secondary | ICD-10-CM

## 2021-09-20 NOTE — Patient Instructions (Signed)
-   Try the advair 2 puffs with spacer in the morning at least 20-30 minutes before whatever physical activity you plan for the day if it's something especially strenuous. Can then continue for a couple of days if still feeling winded.  - rinse your mouth and spacer after each use

## 2021-09-28 DIAGNOSIS — H401131 Primary open-angle glaucoma, bilateral, mild stage: Secondary | ICD-10-CM | POA: Diagnosis not present

## 2021-10-20 DIAGNOSIS — H401111 Primary open-angle glaucoma, right eye, mild stage: Secondary | ICD-10-CM | POA: Diagnosis not present

## 2021-11-30 ENCOUNTER — Ambulatory Visit: Payer: Medicare PPO | Admitting: Internal Medicine

## 2021-11-30 DIAGNOSIS — Z961 Presence of intraocular lens: Secondary | ICD-10-CM | POA: Diagnosis not present

## 2021-11-30 DIAGNOSIS — H26492 Other secondary cataract, left eye: Secondary | ICD-10-CM | POA: Diagnosis not present

## 2021-11-30 DIAGNOSIS — H40053 Ocular hypertension, bilateral: Secondary | ICD-10-CM | POA: Diagnosis not present

## 2021-12-01 ENCOUNTER — Encounter: Payer: Self-pay | Admitting: Internal Medicine

## 2021-12-01 ENCOUNTER — Ambulatory Visit: Payer: Medicare PPO | Admitting: Internal Medicine

## 2021-12-01 ENCOUNTER — Other Ambulatory Visit: Payer: Self-pay

## 2021-12-01 DIAGNOSIS — R7989 Other specified abnormal findings of blood chemistry: Secondary | ICD-10-CM | POA: Diagnosis not present

## 2021-12-01 DIAGNOSIS — I6523 Occlusion and stenosis of bilateral carotid arteries: Secondary | ICD-10-CM | POA: Diagnosis not present

## 2021-12-01 DIAGNOSIS — F4321 Adjustment disorder with depressed mood: Secondary | ICD-10-CM | POA: Insufficient documentation

## 2021-12-01 DIAGNOSIS — I2510 Atherosclerotic heart disease of native coronary artery without angina pectoris: Secondary | ICD-10-CM

## 2021-12-01 LAB — COMPREHENSIVE METABOLIC PANEL
ALT: 41 U/L — ABNORMAL HIGH (ref 0–35)
AST: 43 U/L — ABNORMAL HIGH (ref 0–37)
Albumin: 4.6 g/dL (ref 3.5–5.2)
Alkaline Phosphatase: 45 U/L (ref 39–117)
BUN: 12 mg/dL (ref 6–23)
CO2: 32 mEq/L (ref 19–32)
Calcium: 9.5 mg/dL (ref 8.4–10.5)
Chloride: 95 mEq/L — ABNORMAL LOW (ref 96–112)
Creatinine, Ser: 0.56 mg/dL (ref 0.40–1.20)
GFR: 88.19 mL/min (ref >60.00)
Glucose, Bld: 99 mg/dL (ref 70–99)
Potassium: 4.2 mEq/L (ref 3.5–5.1)
Sodium: 130 mEq/L — ABNORMAL LOW (ref 135–145)
Total Bilirubin: 0.6 mg/dL (ref 0.2–1.2)
Total Protein: 7.4 g/dL (ref 6.0–8.3)

## 2021-12-01 LAB — CBC WITH DIFFERENTIAL/PLATELET
Basophils Absolute: 0.1 10*3/uL (ref 0.0–0.1)
Basophils Relative: 1.2 % (ref 0.0–3.0)
Eosinophils Absolute: 0.2 10*3/uL (ref 0.0–0.7)
Eosinophils Relative: 3.1 % (ref 0.0–5.0)
HCT: 38.8 % (ref 36.0–46.0)
Hemoglobin: 12.9 g/dL (ref 12.0–15.0)
Lymphocytes Relative: 33.3 % (ref 12.0–46.0)
Lymphs Abs: 1.7 10*3/uL (ref 0.7–4.0)
MCHC: 33.2 g/dL (ref 30.0–36.0)
MCV: 89.1 fl (ref 78.0–100.0)
Monocytes Absolute: 0.5 10*3/uL (ref 0.1–1.0)
Monocytes Relative: 9.8 % (ref 3.0–12.0)
Neutro Abs: 2.7 10*3/uL (ref 1.4–7.7)
Neutrophils Relative %: 52.6 % (ref 43.0–77.0)
Platelets: 218 10*3/uL (ref 150.0–400.0)
RBC: 4.36 Mil/uL (ref 3.87–5.11)
RDW: 12.6 % (ref 11.5–15.5)
WBC: 5.1 10*3/uL (ref 4.0–10.5)

## 2021-12-01 LAB — LIPID PANEL
Cholesterol: 133 mg/dL (ref 0–200)
HDL: 94.4 mg/dL (ref >39.00)
LDL Cholesterol: 31 mg/dL (ref 0–99)
NonHDL: 38.1
Total CHOL/HDL Ratio: 1
Triglycerides: 36 mg/dL (ref 0.0–149.0)
VLDL: 7.2 mg/dL (ref 0.0–40.0)

## 2021-12-01 NOTE — Assessment & Plan Note (Signed)
F/u w/Dr Mayford Knife

## 2021-12-01 NOTE — Assessment & Plan Note (Signed)
Continue with Crestor, Repatha

## 2021-12-01 NOTE — Progress Notes (Signed)
Subjective:  Patient ID: Brittany Mathis, female    DOB: 12-26-1945  Age: 76 y.o. MRN: [id number]  CC: No chief complaint on file.   HPI Brittany Mathis presents for grief, osteoporosis, HTN f/u  Outpatient Medications Prior to Visit  Medication Sig Dispense Refill   alendronate (FOSAMAX) 70 MG tablet Take 1 tablet (70 mg total) by mouth every Tuesday. Take with a full glass of water on an empty stomach. 12 tablet 4   aspirin EC 81 MG tablet Take 81 mg by mouth daily with lunch.      Cholecalciferol (VITAMIN D) 50 MCG (2000 UT) tablet Take 2,000 Units by mouth daily with lunch.     estradiol (ESTRACE VAGINAL) 0.1 MG/GM vaginal cream Place 1 g vaginally 2 (two) times a week. Initial dose: nightly x 2 weeks, then every other night x 2 weeks, then twice weekly 42.5 g 12   Evolocumab (REPATHA SURECLICK) [id number] MG/ML SOAJ ADMINISTER 1 ML UNDER THE SKIN EVERY 14 DAYS 6 mL 3   fluticasone-salmeterol (ADVAIR HFA) 115-21 MCG/ACT inhaler Inhale 2 puffs into the lungs 2 (two) times daily. 1 each 12   metoprolol succinate (TOPROL-XL) 25 MG 24 hr tablet Take 1 tablet (25 mg total) by mouth daily. 90 tablet 3   Propylene Glycol (SYSTANE COMPLETE) 0.6 % SOLN Place 1 drop into both eyes daily.     rosuvastatin (CRESTOR) 40 MG tablet Take 1 tablet (40 mg total) by mouth daily. 90 tablet 3   valsartan (DIOVAN) 160 MG tablet Take 1 tablet (160 mg total) by mouth daily. 90 tablet 3   nitroGLYCERIN (NITROSTAT) 0.4 MG SL tablet Place 1 tablet (0.4 mg total) under the tongue every 5 (five) minutes as needed for chest pain. 25 tablet 3   Facility-Administered Medications Prior to Visit  Medication Dose Route Frequency Provider Last Rate Last Admin   regadenoson (LEXISCAN) injection SOLN 0.4 mg  0.4 mg Intravenous Once Skeet Latch, MD       technetium tetrofosmin (TC-MYOVIEW) injection [id number] millicurie  [id number] millicurie Intravenous Once PRN Skeet Latch, MD        ROS: Review of Systems  Constitutional:   Negative for activity change, appetite change, chills, fatigue and unexpected weight change.  HENT:  Negative for congestion, mouth sores and sinus pressure.   Eyes:  Negative for visual disturbance.  Respiratory:  Negative for cough and chest tightness.   Gastrointestinal:  Negative for abdominal pain and nausea.  Genitourinary:  Negative for difficulty urinating, frequency and vaginal pain.  Musculoskeletal:  Positive for arthralgias. Negative for back pain and gait problem.  Skin:  Negative for pallor and rash.  Neurological:  Negative for dizziness, tremors, weakness, numbness and headaches.  Psychiatric/Behavioral:  Negative for confusion, sleep disturbance and suicidal ideas.    Objective:  BP 140/86 (BP Location: Left Arm, Patient Position: Sitting, Cuff Size: Large)    Pulse 60    Temp 98.1 F (36.7 C) (Oral)    Ht 5\' 4"  (1.626 m)    Wt 97 lb (44 kg)    SpO2 99%    BMI 16.65 kg/m   BP Readings from Last 3 Encounters:  12/01/21 140/86  09/20/21 140/72  08/24/21 120/80    Wt Readings from Last 3 Encounters:  12/01/21 97 lb (44 kg)  09/20/21 98 lb 6.4 oz (44.6 kg)  08/24/21 96 lb 4 oz (43.7 kg)    Physical Exam Constitutional:      General: She is not in acute  distress.    Appearance: She is well-developed.  HENT:     Head: Normocephalic.     Right Ear: External ear normal.     Left Ear: External ear normal.     Nose: Nose normal.  Eyes:     General:        Right eye: No discharge.        Left eye: No discharge.     Conjunctiva/sclera: Conjunctivae normal.     Pupils: Pupils are equal, round, and reactive to light.  Neck:     Thyroid: No thyromegaly.     Vascular: No JVD.     Trachea: No tracheal deviation.  Cardiovascular:     Rate and Rhythm: Normal rate and regular rhythm.     Heart sounds: Normal heart sounds.  Pulmonary:     Effort: No respiratory distress.     Breath sounds: No stridor. No wheezing.  Abdominal:     General: Bowel sounds are normal.  There is no distension.     Palpations: Abdomen is soft. There is no mass.     Tenderness: There is no abdominal tenderness. There is no guarding or rebound.  Musculoskeletal:        General: No tenderness.     Cervical back: Normal range of motion and neck supple. No rigidity.  Lymphadenopathy:     Cervical: No cervical adenopathy.  Skin:    Findings: No erythema or rash.  Neurological:     Mental Status: She is oriented to person, place, and time.     Cranial Nerves: No cranial nerve deficit.     Motor: No abnormal muscle tone.     Coordination: Coordination normal.     Deep Tendon Reflexes: Reflexes normal.  Psychiatric:        Behavior: Behavior normal.        Thought Content: Thought content normal.        Judgment: Judgment normal.    Lab Results  Component Value Date   WBC 5.6 05/18/2021   HGB 13.1 05/18/2021   HCT 39.3 05/18/2021   PLT 236.0 05/18/2021   GLUCOSE 101 (H) 08/24/2021   CHOL 164 05/18/2021   TRIG 46.0 05/18/2021   HDL 93.00 05/18/2021   LDLDIRECT 109.2 02/05/2012   LDLCALC 62 05/18/2021   ALT 49 (H) 08/24/2021   AST 57 (H) 08/24/2021   NA 131 (L) 08/24/2021   K 4.9 08/24/2021   CL 96 08/24/2021   CREATININE 0.60 08/24/2021   BUN 16 08/24/2021   CO2 30 08/24/2021   TSH 1.97 05/18/2021    VAS US CAROTID  Result Date: 11/19/2019 Carotid Arterial Duplex Study Indications:  Bilateral bruits. Patient denies any cerebrovascular symptoms, but               says she does have a strong family history of cardiac diseaes and               an elevated coronary calcium score. Risk Factors: Hypertension, hyperlipidemia, no history of smoking. Performing Technologist: Mariane Masters RVT  Examination Guidelines: A complete evaluation includes B-mode imaging, spectral Doppler, color Doppler, and power Doppler as needed of all accessible portions of each vessel. Bilateral testing is considered an integral part of a complete examination. Limited examinations for  reoccurring indications may be performed as noted.  Right Carotid Findings: +----------+--------+--------+--------+------------------+--------+             PSV cm/s EDV cm/s Stenosis Plaque Description Comments  +----------+--------+--------+--------+------------------+--------+  CCA Prox  74       15                                             +----------+--------+--------+--------+------------------+--------+  CCA Distal 61       13                                             +----------+--------+--------+--------+------------------+--------+  ICA Prox   54       14       1-39%    heterogenous                 +----------+--------+--------+--------+------------------+--------+  ICA Mid    52       18                                             +----------+--------+--------+--------+------------------+--------+  ICA Distal 108      30                                             +----------+--------+--------+--------+------------------+--------+  ECA        48       7                                              +----------+--------+--------+--------+------------------+--------+ +----------+--------+-------+----------------+-------------------+             PSV cm/s EDV cms Describe         Arm Pressure (mmHG)  +----------+--------+-------+----------------+-------------------+  Subclavian 114              Multiphasic, WNL 138                  +----------+--------+-------+----------------+-------------------+ +---------+--------+--+--------+--+---------+  Vertebral PSV cm/s 70 EDV cm/s 17 Antegrade  +---------+--------+--+--------+--+---------+  Left Carotid Findings: +----------+--------+--------+--------+------------------+--------+             PSV cm/s EDV cm/s Stenosis Plaque Description Comments  +----------+--------+--------+--------+------------------+--------+  CCA Prox   60       14                                             +----------+--------+--------+--------+------------------+--------+  CCA Distal 54        16                                             +----------+--------+--------+--------+------------------+--------+  ICA Prox   60       18                heterogenous                 +----------+--------+--------+--------+------------------+--------+  ICA Mid  87       26       1-39%                                 +----------+--------+--------+--------+------------------+--------+  ICA Distal 89       34                                             +----------+--------+--------+--------+------------------+--------+  ECA        63       5                                              +----------+--------+--------+--------+------------------+--------+ +----------+--------+--------+----------------+-------------------+             PSV cm/s EDV cm/s Describe         Arm Pressure (mmHG)  +----------+--------+--------+----------------+-------------------+  Subclavian 125               Multiphasic, WNL 140                  +----------+--------+--------+----------------+-------------------+ +---------+--------+--+--------+-+---------+  Vertebral PSV cm/s 23 EDV cm/s 7 Antegrade  +---------+--------+--+--------+-+---------+   Summary: Right Carotid: Velocities in the right ICA are consistent with a 1-39% stenosis. Left Carotid: Velocities in the left ICA are consistent with a 1-39% stenosis. Vertebrals:  Bilateral vertebral arteries demonstrate antegrade flow. Subclavians: Normal flow hemodynamics were seen in bilateral subclavian              arteries. *See table(s) above for measurements and observations.  Electronically signed by Larae Grooms MD on 11/19/2019 at 11:50:52 AM.    Final     Assessment & Plan:   Problem List Items Addressed This Visit     CAD (coronary artery disease)    F/u w/Dr Radford Pax      Relevant Orders   Comprehensive metabolic panel   CBC with Differential/Platelet   Lipid panel   Carotid artery stenosis    Continue with Crestor, Repatha      Relevant Orders   Comprehensive  metabolic panel   CBC with Differential/Platelet   Lipid panel   Elevated LFTs    We will continue       Relevant Orders   Comprehensive metabolic panel   CBC with Differential/Platelet   Lipid panel   Grief    Mom died in 2021/02/06         No orders of the defined types were placed in this encounter.     Follow-up: Return in about 4 months (around 03/31/2022) for a follow-up visit.  Walker Kehr, MD

## 2021-12-01 NOTE — Assessment & Plan Note (Signed)
We will continue

## 2021-12-01 NOTE — Assessment & Plan Note (Signed)
Mom died in 02/21/2021

## 2021-12-14 ENCOUNTER — Other Ambulatory Visit: Payer: Self-pay | Admitting: Cardiology

## 2021-12-14 ENCOUNTER — Other Ambulatory Visit: Payer: Self-pay

## 2021-12-14 MED ORDER — METOPROLOL SUCCINATE ER 25 MG PO TB24: 25.0000 mg | ORAL_TABLET | Freq: Every day | ORAL | 0 refills | Status: DC

## 2022-01-01 NOTE — Progress Notes (Signed)
?Cardiology Office Note:   ? ?Date:  01/02/2022  ? ?ID:  Brittany Mathis, DOB 1946-03-21, MRN [id number] ? ?PCP:  Plotnikov, Evie Lacks, MD  ?Cardiologist:  Fransico Him, MD   ? ?Referring MD: Cassandria Anger, MD  ? ?Chief Complaint  ?Patient presents with  ? Coronary Artery Disease  ? Hypertension  ? Hyperlipidemia  ? ? ?History of Present Illness:   ? ?Brittany Mathis is a 76 y.o. female with a hx of HTN and HLD who underwent risk factor assessment for CAD with coronary artery calcium screening and was found to have a calcium score of 1431. She has  a strong family hx of CAD with her father having an MI in his 69's.  Her mom had a CVA.  She had CRF besides her fm hx including HTN and HLD.   She underwent cardiac cath in 2020 for CP showing 70% RPAV, 25% RCA, 70% pLAD, 90% mLAD, 90% D1 and normal LVF.  Aggressive medical therapy was recommended.  It was recommended at the time of cath to pursue CABG for refractory angina as she was not a candidate for PCI due to complexity of her disease.  ? ?She is here today for followup and is doing well.  She denies any chest pain or pressure, SOB, DOE, PND, orthopnea, LE edema, dizziness, palpitations or syncope. She is compliant with her meds and is tolerating meds with no SE.    ?Past Medical History:  ?Diagnosis Date  ? Carotid artery stenosis   ? 1-39% bilateral by dopplers 11/2019  ? Cataract   ? History of shingles   ? Hyperlipidemia   ? Hypertension   ? Nephrolithiasis 1972  ? Osteoporosis 05/2017  ? T score -2.7  ? Thyroid disease 2010  ? thyroiditis  ? ? ?Past Surgical History:  ?Procedure Laterality Date  ? ABDOMINAL HYSTERECTOMY  1983  ? with BSO  menorrhagia, endometriosis  ? cataracts Bilateral 2018  ? LEFT HEART CATH AND CORONARY ANGIOGRAPHY N/A 09/05/2019  ? Procedure: LEFT HEART CATH AND CORONARY ANGIOGRAPHY;  Surgeon: Martinique, Peter M, MD;  Location: [facility];  Service: Cardiovascular;  Laterality: N/A;  ? OOPHORECTOMY    ? BSO  ? ? ?Current  Medications: ?Current Meds  ?Medication Sig  ? alendronate (FOSAMAX) 70 MG tablet Take 1 tablet (70 mg total) by mouth every Tuesday. Take with a full glass of water on an empty stomach.  ? aspirin EC 81 MG tablet Take 81 mg by mouth daily with lunch.   ? Cholecalciferol (VITAMIN D) 50 MCG (2000 UT) tablet Take 2,000 Units by mouth daily with lunch.  ? estradiol (ESTRACE VAGINAL) 0.1 MG/GM vaginal cream Place 1 g vaginally 2 (two) times a week. Initial dose: nightly x 2 weeks, then every other night x 2 weeks, then twice weekly  ? Evolocumab (REPATHA SURECLICK) [id number] MG/ML SOAJ ADMINISTER 1 ML UNDER THE SKIN EVERY 14 DAYS  ? fluticasone-salmeterol (ADVAIR HFA) 115-21 MCG/ACT inhaler Inhale 2 puffs into the lungs as needed (wheezing sob).  ? metoprolol succinate (TOPROL-XL) 25 MG 24 hr tablet Take 1 tablet (25 mg total) by mouth daily. Pt needs to keep upcoming appt in Mar for further refills  ? Propylene Glycol (SYSTANE COMPLETE) 0.6 % SOLN Place 1 drop into both eyes daily.  ? rosuvastatin (CRESTOR) 40 MG tablet Take 1 tablet (40 mg total) by mouth daily. Pt needs to keep upcoming appt in Mar for further refills  ? valsartan (DIOVAN) 160  MG tablet Take 1 tablet (160 mg total) by mouth daily. Pt needs to keep upcoming appt in Mar for further refills  ?  ? ?Allergies:   Patient has no known allergies.  ? ?Social History  ? ?Socioeconomic History  ? Marital status: Married  ?  Spouse name: Not on file  ? Number of children: Not on file  ? Years of education: Not on file  ? Highest education level: Not on file  ?Occupational History  ? Not on file  ?Tobacco Use  ? Smoking status: Never  ? Smokeless tobacco: Never  ? Tobacco comments:  ?  experimented in college  ?Vaping Use  ? Vaping Use: Never used  ?Substance and Sexual Activity  ? Alcohol use: Yes  ?  Alcohol/week: 14.0 standard drinks  ?  Types: 14 Standard drinks or equivalent per week  ? Drug use: No  ? Sexual activity: Not Currently  ?  Birth control/protection:  Surgical  ?  Comment: HYST-1st intercourse 71 yo-1 partner  ?Other Topics Concern  ? Not on file  ?Social History Narrative  ? Not on file  ? ?Social Determinants of Health  ? ?Financial Resource Strain: Low Risk   ? Difficulty of Paying Living Expenses: Not hard at all  ?Food Insecurity: No Food Insecurity  ? Worried About Charity fundraiser in the Last Year: Never true  ? Ran Out of Food in the Last Year: Never true  ?Transportation Needs: No Transportation Needs  ? Lack of Transportation (Medical): No  ? Lack of Transportation (Non-Medical): No  ?Physical Activity: Sufficiently Active  ? Days of Exercise per Week: 5 days  ? Minutes of Exercise per Session: 30 min  ?Stress: No Stress Concern Present  ? Feeling of Stress : Not at all  ?Social Connections: Socially Integrated  ? Frequency of Communication with Friends and Family: More than three times a week  ? Frequency of Social Gatherings with Friends and Family: Twice a week  ? Attends Religious Services: More than 4 times per year  ? Active Member of Clubs or Organizations: Yes  ? Attends Archivist Meetings: More than 4 times per year  ? Marital Status: Married  ?  ? ?Family History: ?The patient's family history includes Breast cancer in her mother; Colon cancer in her maternal aunt; Glaucoma in her father; Hyperlipidemia in her father and mother; Hypertension in her father and mother; Kidney disease in her father; Stroke in her mother. ? ?ROS:   ?Please see the history of present illness.    ?ROS  ?All other systems reviewed and negative.  ? ?EKGs/Labs/Other Studies Reviewed:   ? ?The following studies were reviewed today: ?Cardiac cath, labs from PCP ? ?EKG:  EKG is ordered today and showed NSR with rSR' V1 c/w RV conduction delay ? ?Recent Labs: ?05/18/2021: TSH 1.97 ?12/01/2021: ALT 41; BUN 12; Creatinine, Ser 0.56; Hemoglobin 12.9; Platelets 218.0; Potassium 4.2; Sodium 130  ? ?Recent Lipid Panel ?   ?Component Value Date/Time  ? CHOL 133  12/01/2021 1126  ? CHOL 134 04/20/2021 0819  ? TRIG 36.0 12/01/2021 1126  ? HDL 94.40 12/01/2021 1126  ? HDL 87 04/20/2021 0819  ? CHOLHDL 1 12/01/2021 1126  ? VLDL 7.2 12/01/2021 1126  ? [place] 31 12/01/2021 1126  ? [place] 36 04/20/2021 0819  ? LDLDIRECT 109.2 02/05/2012 0905  ? ? ?Physical Exam:   ? ?VS:  BP 120/82 (BP Location: Left Arm, Patient Position: Sitting, Cuff Size: Small)  Pulse (!) 56   Ht 5\' 4"  (1.626 m)   Wt 94 lb 9.6 oz (42.9 kg)   SpO2 99%   BMI 16.24 kg/m?    ? ?Wt Readings from Last 3 Encounters:  ?01/02/22 94 lb 9.6 oz (42.9 kg)  ?12/01/21 97 lb (44 kg)  ?09/20/21 98 lb 6.4 oz (44.6 kg)  ?  ?GEN: Well nourished, well developed in no acute distress ?HEENT: Normal ?NECK: No JVD; No carotid bruits ?LYMPHATICS: No lymphadenopathy ?CARDIAC:RRR, no murmurs, rubs, gallops ?RESPIRATORY:  Clear to auscultation without rales, wheezing or rhonchi  ?ABDOMEN: Soft, non-tender, non-distended ?MUSCULOSKELETAL:  No edema; No deformity  ?SKIN: Warm and dry ?NEUROLOGIC:  Alert and oriented x 3 ?PSYCHIATRIC:  Normal affect   ?ASSESSMENT:   ? ?1. Coronary artery disease involving native coronary artery of native heart without angina pectoris   ?2. Essential hypertension   ?3. Pure hypercholesterolemia   ? ?PLAN:   ? ?In order of problems listed above: ? ?1.  ASCAD ?-cath showing 70% RPAV, 25% RCA, 70% pLAD, 90% mLAD, 90% D1 ?-It was recommended at the time of cath to pursue CABG for refractory angina as she was not a candidate for PCI due to complexity of her disease.  ?-she has not had any anginal symptoms since I saw her last ?-continue prescription drug management with ASA 81mg  daily, Toprol XL 25mg  daily and statin with PRN refills ? ?2.  HTN ?-BP is controlled on exam today ?-continue prescription drug management with Toprol XL 25mg  daily and Valsartan 160mg  daily with PRN refills ?-I have personally reviewed and interpreted outside labs performed by patient's PCP which showed SCr 0.56 and K+ 4.2 on  12/01/2021  ? ?3.  HLD ?-LDL goal < 70 ?-I have personally reviewed and interpreted outside labs performed by patient's PCP which showed LDL 31, HDL 94 and ALT 41 on 12/01/2021  ?-continue prescription drug management with Crestor 4

## 2022-01-02 ENCOUNTER — Ambulatory Visit: Payer: Medicare PPO | Admitting: Cardiology

## 2022-01-02 ENCOUNTER — Encounter: Payer: Self-pay | Admitting: Cardiology

## 2022-01-02 ENCOUNTER — Other Ambulatory Visit: Payer: Self-pay

## 2022-01-02 VITALS — BP 120/82 | HR 56 | Ht 64.0 in | Wt 94.6 lb

## 2022-01-02 DIAGNOSIS — I200 Unstable angina: Secondary | ICD-10-CM | POA: Diagnosis not present

## 2022-01-02 DIAGNOSIS — E7800 Pure hypercholesterolemia, unspecified: Secondary | ICD-10-CM | POA: Diagnosis not present

## 2022-01-02 DIAGNOSIS — I10 Essential (primary) hypertension: Secondary | ICD-10-CM

## 2022-01-02 DIAGNOSIS — I2510 Atherosclerotic heart disease of native coronary artery without angina pectoris: Secondary | ICD-10-CM

## 2022-01-02 MED ORDER — NITROGLYCERIN 0.4 MG SL SUBL: 0.4000 mg | SUBLINGUAL_TABLET | SUBLINGUAL | 3 refills | Status: AC | PRN

## 2022-01-02 NOTE — Patient Instructions (Signed)
Medication Instructions:  ?Your physician recommends that you continue on your current medications as directed. Please refer to the Current Medication list given to you today. ?REFILL: nitroglycerin ?*If you need a refill on your cardiac medications before your next appointment, please call your pharmacy* ? ? ?Lab Work: ?NONE ?If you have labs (blood work) drawn today and your tests are completely normal, you will receive your results only by: ?MyChart Message (if you have MyChart) OR ?A paper copy in the mail ?If you have any lab test that is abnormal or we need to change your treatment, we will call you to review the results. ? ? ?Testing/Procedures: ?NONE ? ? ?Follow-Up: ?At [facility], you and your health needs are our priority.  As part of our continuing mission to provide you with exceptional heart care, we have created designated Provider Care Teams.  These Care Teams include your primary Cardiologist (physician) and Advanced Practice Providers (APPs -  Physician Assistants and Nurse Practitioners) who all work together to provide you with the care you need, when you need it. ? ? ?Your next appointment:   ?1 year(s) ? ?The format for your next appointment:   ?In Person ? ?Provider:   ?Armanda Magic, MD   ? ?

## 2022-01-05 DIAGNOSIS — H26492 Other secondary cataract, left eye: Secondary | ICD-10-CM | POA: Diagnosis not present

## 2022-01-13 ENCOUNTER — Other Ambulatory Visit: Payer: Self-pay | Admitting: Cardiology

## 2022-01-20 ENCOUNTER — Other Ambulatory Visit: Payer: Self-pay

## 2022-01-20 ENCOUNTER — Other Ambulatory Visit: Payer: Self-pay | Admitting: Cardiology

## 2022-01-20 MED ORDER — METOPROLOL SUCCINATE ER 25 MG PO TB24: 25.0000 mg | ORAL_TABLET | Freq: Every day | ORAL | 3 refills | Status: DC

## 2022-02-02 DIAGNOSIS — H401131 Primary open-angle glaucoma, bilateral, mild stage: Secondary | ICD-10-CM | POA: Diagnosis not present

## 2022-02-23 DIAGNOSIS — H401122 Primary open-angle glaucoma, left eye, moderate stage: Secondary | ICD-10-CM | POA: Diagnosis not present

## 2022-04-05 DIAGNOSIS — H401122 Primary open-angle glaucoma, left eye, moderate stage: Secondary | ICD-10-CM | POA: Diagnosis not present

## 2022-04-21 NOTE — Progress Notes (Unsigned)
Synopsis: Referred for dyspnea by Plotnikov, Georgina Quint, MD  Subjective:   PATIENT ID: Brittany Mathis GENDER: female DOB: Feb 25, 1946, MRN: [id number]  No chief complaint on file.    74yF with history of never smoker, HTN, severe 2 vessel CAD based on cath in 2020 not candidate for PCI who was referred from cardiology for persistent dyspnea after negative pharmacologic nuke stress.   She says that Dr. Posey Rea had referred her for CAC testing, then got plugged in with cardiologist and got LHC, discovered severe multivessel disease as above. Is on a lot of CAD directed medications.   She says currently when she exerts herself or when she's exposed to high heat and humidity develops chest tightness, central. If she goes to a quiet room, drinks a glass of cold water it goes away, emphasizes that rest is key. Never has tried NG for it. Hasn't tried any inhalers, never had asthma as a kid. She actually hasn't had an episode in 2-3 weeks since temperature has come down and she's attenuated her outdoor activities. No accompanying throat tightness. She notices no substantial associated cough. THere may be mild component of post-nasal drip. She notices no trouble with GERD. No orthopnea.  She was a high school english/middle school teacher for 30 years, retired in 2009. In [employer] [place]. No significant toxic exposures. She has no pets including no birds. She did live briefly in [facility] for 2 years.   She has no allergies to medications  She has no family history of lung disease.   Interval HPI: Maintained on advair 2 puffs bid prn  Otherwise pertinent review of systems is negative.  Past Medical History:  Diagnosis Date   Carotid artery stenosis    1-39% bilateral by dopplers 11/2019   Cataract    History of shingles    Hyperlipidemia    Hypertension    Nephrolithiasis 1972   Osteoporosis 05/2017   T score -2.7   Thyroid disease 2010   thyroiditis     Family History  Problem Relation  Age of Onset   Breast cancer Mother    Hypertension Mother    Hyperlipidemia Mother    Stroke Mother    Hypertension Father    Hyperlipidemia Father    Glaucoma Father    Kidney disease Father    Colon cancer Maternal Aunt      Past Surgical History:  Procedure Laterality Date   ABDOMINAL HYSTERECTOMY  1983   with BSO  menorrhagia, endometriosis   cataracts Bilateral 2018   LEFT HEART CATH AND CORONARY ANGIOGRAPHY N/A 09/05/2019   Procedure: LEFT HEART CATH AND CORONARY ANGIOGRAPHY;  Surgeon: [place]az[place], Peter M, MD;  Location: [facility];  Service: Cardiovascular;  Laterality: N/A;   OOPHORECTOMY     BSO    Social History   Socioeconomic History   Marital status: Married    Spouse name: Not on file   Number of children: Not on file   Years of education: Not on file   Highest education level: Not on file  Occupational History   Not on file  Tobacco Use   Smoking status: Never   Smokeless tobacco: Never   Tobacco comments:    experimented in college  Vaping Use   Vaping Use: Never used  Substance and Sexual Activity   Alcohol use: Yes    Alcohol/week: 14.0 standard drinks of alcohol    Types: 14 Standard drinks or equivalent per week   Drug use: No  Sexual activity: Not Currently    Birth control/protection: Surgical    Comment: HYST-1st intercourse 50 yo-1 partner  Other Topics Concern   Not on file  Social History Narrative   Not on file   Social Determinants of Health   Financial Resource Strain: Low Risk  (06/03/2021)   Overall Financial Resource Strain (CARDIA)    Difficulty of Paying Living Expenses: Not hard at all  Food Insecurity: No Food Insecurity (06/03/2021)   Hunger Vital Sign    Worried About Running Out of Food in the Last Year: Never true    Ran Out of Food in the Last Year: Never true  Transportation Needs: No Transportation Needs (06/03/2021)   PRAPARE - Administrator, Civil Service (Medical): No    Lack of  Transportation (Non-Medical): No  Physical Activity: Sufficiently Active (06/03/2021)   Exercise Vital Sign    Days of Exercise per Week: 5 days    Minutes of Exercise per Session: 30 min  Stress: No Stress Concern Present (06/03/2021)   [employer] of Occupational Health - Occupational Stress Questionnaire    Feeling of Stress : Not at all  Social Connections: Socially Integrated (06/03/2021)   Social Connection and Isolation Panel [NHANES]    Frequency of Communication with Friends and Family: More than three times a week    Frequency of Social Gatherings with Friends and Family: Twice a week    Attends Religious Services: More than 4 times per year    Active Member of [employer] or Organizations: Yes    Attends Engineer, structural: More than 4 times per year    Marital Status: Married  Catering manager Violence: Not At Risk (06/03/2021)   Humiliation, Afraid, Rape, and Kick questionnaire    Fear of Current or Ex-Partner: No    Emotionally Abused: No    Physically Abused: No    Sexually Abused: No     No Known Allergies   Outpatient Medications Prior to Visit  Medication Sig Dispense Refill   alendronate (FOSAMAX) 70 MG tablet Take 1 tablet (70 mg total) by mouth every Tuesday. Take with a full glass of water on an empty stomach. 12 tablet 4   aspirin EC 81 MG tablet Take 81 mg by mouth daily with lunch.      Cholecalciferol (VITAMIN D) 50 MCG (2000 UT) tablet Take 2,000 Units by mouth daily with lunch.     estradiol (ESTRACE VAGINAL) 0.1 MG/GM vaginal cream Place 1 g vaginally 2 (two) times a week. Initial dose: nightly x 2 weeks, then every other night x 2 weeks, then twice weekly 42.5 g 12   Evolocumab (REPATHA SURECLICK) 140 MG/ML SOAJ ADMINISTER 1 ML UNDER THE SKIN EVERY 14 DAYS 6 mL 3   fluticasone-salmeterol (ADVAIR HFA) 115-21 MCG/ACT inhaler Inhale 2 puffs into the lungs as needed (wheezing sob).     metoprolol succinate (TOPROL-XL) 25 MG 24 hr tablet Take 1 tablet  (25 mg total) by mouth daily. 90 tablet 3   nitroGLYCERIN (NITROSTAT) 0.4 MG SL tablet Place 1 tablet (0.4 mg total) under the tongue every 5 (five) minutes as needed for chest pain. 25 tablet 3   Propylene Glycol (SYSTANE COMPLETE) 0.6 % SOLN Place 1 drop into both eyes daily.     rosuvastatin (CRESTOR) 40 MG tablet TAKE 1 TABLET(40 MG) BY MOUTH DAILY 90 tablet 3   valsartan (DIOVAN) 160 MG tablet TAKE 1 TABLET(160 MG) BY MOUTH DAILY 90 tablet 3   Facility-Administered  Medications Prior to Visit  Medication Dose Route Frequency Provider Last Rate Last Admin   regadenoson (LEXISCAN) injection SOLN 0.4 mg  0.4 mg Intravenous Once Chilton Si, MD       technetium tetrofosmin (TC-MYOVIEW) injection 29.4 millicurie  29.4 millicurie Intravenous Once PRN Chilton Si, MD           Objective:   Physical Exam:  General appearance: 76 y.o., female, NAD, conversant  Eyes: anicteric sclerae, moist conjunctivae; no lid-lag; PERRL, tracking appropriately HENT: NCAT; oropharynx, MMM, no mucosal ulcerations; normal hard and soft palate Neck: Trachea midline; no lymphadenopathy, no JVD Lungs: CTAB, no crackles, no wheeze, with normal respiratory effort CV: RRR, no MRGs  Abdomen: Soft, non-tender; non-distended, BS present  Extremities: No peripheral edema, radial and DP pulses present bilaterally  Skin: Normal temperature, turgor and texture; no rash Psych: Appropriate affect Neuro: Alert and oriented to person and place, no focal deficit    There were no vitals filed for this visit.     on RA BMI Readings from Last 3 Encounters:  01/02/22 16.24 kg/m  12/01/21 16.65 kg/m  09/20/21 16.89 kg/m   Wt Readings from Last 3 Encounters:  01/02/22 94 lb 9.6 oz (42.9 kg)  12/01/21 97 lb (44 kg)  09/20/21 98 lb 6.4 oz (44.6 kg)     CBC    Component Value Date/Time   WBC 5.1 12/01/2021 1126   RBC 4.36 12/01/2021 1126   HGB 12.9 12/01/2021 1126   HGB 12.5 09/03/2019 1228   HCT  38.8 12/01/2021 1126   HCT 35.8 09/03/2019 1228   PLT 218.0 12/01/2021 1126   PLT 222 09/03/2019 1228   MCV 89.1 12/01/2021 1126   MCV 87 09/03/2019 1228   MCH 30.4 09/03/2019 1228   MCH 29.1 10/18/2014 1222   MCHC 33.2 12/01/2021 1126   RDW 12.6 12/01/2021 1126   RDW 12.5 09/03/2019 1228   LYMPHSABS 1.7 12/01/2021 1126   LYMPHSABS 1.7 09/03/2019 1228   MONOABS 0.5 12/01/2021 1126   EOSABS 0.2 12/01/2021 1126   EOSABS 0.1 09/03/2019 1228   BASOSABS 0.1 12/01/2021 1126   BASOSABS 0.0 09/03/2019 1228    TSH, Hb,  05/18/21 WNL  Eos 200-400 historically  Chest Imaging: CT Cardiac 06/2019 reviewed and remarkable for essentially clear lungs, just a bit of bilateral lower lobe scarring similar to that seen on prior CT A/P  CXR 2018 reviewed by me and unremarkable  CXR 06/08/21 reviewed by me unremarkable   Pulmonary Functions Testing Results:    Latest Ref Rng & Units 06/08/2021   10:24 AM  PFT Results  FVC-Pre L 1.90   FVC-Predicted Pre % 68   FVC-Post L 1.93   FVC-Predicted Post % 69   Pre FEV1/FVC % % 79   Post FEV1/FCV % % 83   FEV1-Pre L 1.50   FEV1-Predicted Pre % 72   FEV1-Post L 1.59   DLCO uncorrected ml/min/mmHg 17.81   DLCO UNC% % 93   DLCO corrected ml/min/mmHg 17.97   DLCO COR %Predicted % 94   DLVA Predicted % 108   TLC L 5.06   TLC % Predicted % 100   RV % Predicted % 88    FEV1/SVC 50%, obstruction is mild by post BD FEV1, no BD effect. Normal DLCO.   Echocardiogram:  TTE 04/2017 with G1DD  LHC 08/2019: 1. Severe 2 vessel obstructive CAD    - 70% LAD proximally at the bifurcation with a very large diagonal branch. Segmental 90% mid  LAD stenosis. This vessel is heavily calcified and tortuous    - 90% ostial large first diagonal    - 70% PL branch of the RCA 2. Normal LV function 3. Normal LVEDP    Assessment & Plan:   # Mild obstructive lung disease: # Possible mild intermittent asthma No smoking/smoke exposure concerning for COPD, no  underlying conditions/medications raising concern for BO, DIPNECH but CT not clearly suggestive of it. Symptom burden is very mild.   # Chest tightness: # Dyspnea on exertion:  Considerations include obstructive lung disease (whether asthma, CHF with diastolic dysfunction although she appears very well compensated with no overt evidence of volume overload on exam, chronotropic insufficiency from beta-blocker effect, or perhaps nuke stress missed severe balanced obstructive CAD (although maybe you'd expect to see some repolarization abnormalities on accompanying EKG). Deconditioning seems relatively unlikely given how active she sounds.  # Tree-in-bud nodularity: # Bilateral lower lobe scarring: Could be related to prior or subclinical NTM infection. Low aspiration risk. No constitutional symptoms or bothersome cough. No need for surveillance imaging, low risk for lung CA.  Plan: - continue advair hfa 115 2 puffs BID as needed in anticipation of heavy exertional activity, add spacer, rinse mouth and spacer after use  RTC 6 months     Omar Person, MD [facility] 04/21/2022 1:13 PM

## 2022-04-24 ENCOUNTER — Ambulatory Visit (INDEPENDENT_AMBULATORY_CARE_PROVIDER_SITE_OTHER): Payer: Medicare PPO | Admitting: Student

## 2022-04-24 ENCOUNTER — Encounter: Payer: Self-pay | Admitting: Student

## 2022-04-24 VITALS — BP 130/74 | HR 58 | Temp 97.6°F | Ht 64.0 in | Wt 96.2 lb

## 2022-04-24 DIAGNOSIS — J449 Chronic obstructive pulmonary disease, unspecified: Secondary | ICD-10-CM

## 2022-04-24 NOTE — Patient Instructions (Signed)
-   Try the advair 2 puffs with spacer in the morning at least 20-30 minutes before whatever physical activity you plan for the day if it's something especially strenuous. Can then continue for a couple of days if still feeling winded.  - rinse your mouth and spacer after each use - humidity could be potential trigger for worsening asthma control - do your best to avoid strenuous activity outside when very humid.  - see you in a year or sooner if need be!

## 2022-04-25 ENCOUNTER — Telehealth: Payer: Self-pay | Admitting: Gastroenterology

## 2022-04-27 NOTE — Telephone Encounter (Signed)
Good Afternoon Dr. Russella Dar,   We have received records for patient to have a colonoscopy. Patient is wanting to transfer her care from Dr. Jennye Boroughs office over to you due to him retiring. Patient had her last colonoscopy in 2021. I will be sending records for you to review, will you please review and advise on scheduling?  Thank you.

## 2022-05-03 DIAGNOSIS — Z1231 Encounter for screening mammogram for malignant neoplasm of breast: Secondary | ICD-10-CM | POA: Diagnosis not present

## 2022-05-04 ENCOUNTER — Encounter: Payer: Self-pay | Admitting: Nurse Practitioner

## 2022-05-11 NOTE — Telephone Encounter (Signed)
LVM for patient stating that Dr. Russella Dar stated she was not due for a colonoscopy until 10/26. Advised patient that if she needed an appointment between now and then to give [place] a call. Recall placed for patient.

## 2022-05-17 ENCOUNTER — Ambulatory Visit (INDEPENDENT_AMBULATORY_CARE_PROVIDER_SITE_OTHER): Payer: Medicare PPO | Admitting: Nurse Practitioner

## 2022-05-17 ENCOUNTER — Encounter: Payer: Self-pay | Admitting: Nurse Practitioner

## 2022-05-17 VITALS — BP 144/76 | HR 56 | Ht 63.0 in | Wt 93.0 lb

## 2022-05-17 DIAGNOSIS — Z01419 Encounter for gynecological examination (general) (routine) without abnormal findings: Secondary | ICD-10-CM

## 2022-05-17 DIAGNOSIS — M810 Age-related osteoporosis without current pathological fracture: Secondary | ICD-10-CM | POA: Diagnosis not present

## 2022-05-17 DIAGNOSIS — Z780 Asymptomatic menopausal state: Secondary | ICD-10-CM

## 2022-05-17 DIAGNOSIS — N952 Postmenopausal atrophic vaginitis: Secondary | ICD-10-CM

## 2022-05-17 MED ORDER — ALENDRONATE SODIUM 70 MG PO TABS
70.0000 mg | ORAL_TABLET | ORAL | 4 refills | Status: DC
Start: 2022-05-23 — End: 2023-08-14

## 2022-05-17 NOTE — Progress Notes (Signed)
   Brittany Mathis 06-Jun-1946 [id number]   History:  76 y.o. [name] presents for breast and pelvic exam. Postmenopausal - no HRT. Was on estradiol patch but weaned after CAD diagnosis. S/P 1983 TAH BSO for endometriosis and menorrhagia. Using vaginal estrogen for atrophy and dryness. Fosamax x 4 years for osteoporosis. HLD, HTN, CAD managed by cardiology.   Gynecologic History No LMP recorded. Patient is postmenopausal.   Contraception: status post hysterectomy Sexually active: No  Health Maintenance Last Pap: No longer screening per guidelines Last mammogram: 05/03/2022. Results were: Normal Last colonoscopy: 08/10/2020, 5-year recall Last Dexa: 06/08/2020. Results were: T-score -2.4  Past medical history, past surgical history, family history and social history were all reviewed and documented in the EPIC chart. Married. Daughter works for [employer] in [place], has 80 yo son. Mother passed away last 2023-10-12 at age 57.   ROS:  A ROS was performed and pertinent positives and negatives are included.  Exam:  Vitals:   05/17/22 1009  BP: (!) 144/76  Pulse: (!) 56  SpO2: (!) 14%  Weight: 93 lb (42.2 kg)  Height: 5\' 3"  (1.6 m)    Body mass index is 16.47 kg/m.  General appearance:  Normal Thyroid:  Symmetrical, normal in size, without palpable masses or nodularity. Respiratory  Auscultation:  Clear without wheezing or rhonchi Cardiovascular  Auscultation:  Regular rate, without rubs, murmurs or gallops  Edema/varicosities:  Not grossly evident Abdominal  Soft,nontender, without masses, guarding or rebound.  Liver/spleen:  No organomegaly noted  Hernia:  None appreciated  Skin  Inspection:  Grossly normal Breasts: Examined lying and sitting.   Right: Without masses, retractions, nipple discharge or axillary adenopathy.   Left: Without masses, retractions, nipple discharge or axillary adenopathy. Genitourinary   Inguinal/mons:  Normal without inguinal  adenopathy  External genitalia:  Atrophic changes  BUS/Urethra/Skene's glands:  Normal  Vagina:  Atrophic changes  Cervix:  Absent  Uterus:  Absent  Anus and perineum: Normal  Digital rectal exam: Normal sphincter tone without palpated masses or tenderness  Patient informed chaperone available to be present for breast and pelvic exam. Patient has requested no chaperone to be present. Patient has been advised what will be completed during breast and pelvic exam.   Assessment/Plan:  76 y.o. [name] for breast and pelvic exam.   Well female exam with routine gynecological exam - Education provided on SBEs, importance of preventative screenings, current guidelines, high calcium diet, regular exercise, and multivitamin daily. Labs with PCP.   Postmenopausal - no HRT.   Postmenopausal osteoporosis - Plan: alendronate (FOSAMAX) 70 MG tablet weekly. Started Fosamax 4 years ago. T-score -2.4 05/2020. DXA scheduled 8/29. Continue vitamin D supplement and regular exercise.  Screening for cervical cancer - Normal Pap history. No longer screening per guidelines.   Screening for breast cancer - Normal mammogram history.  Continue annual screenings.  Normal breast exam today.  Screening for colon cancer - 2021 colonoscopy. Will repeat at GI's recommended interval.   Return in 2 years for breast and pelvic exam.     2022 DNP, 10:33 AM 05/17/2022

## 2022-05-18 ENCOUNTER — Other Ambulatory Visit: Payer: Self-pay | Admitting: *Deleted

## 2022-05-18 DIAGNOSIS — M810 Age-related osteoporosis without current pathological fracture: Secondary | ICD-10-CM

## 2022-06-05 ENCOUNTER — Ambulatory Visit (INDEPENDENT_AMBULATORY_CARE_PROVIDER_SITE_OTHER): Payer: Medicare PPO

## 2022-06-05 DIAGNOSIS — Z0000 Encounter for general adult medical examination without abnormal findings: Secondary | ICD-10-CM

## 2022-06-05 NOTE — Progress Notes (Cosign Needed Addendum)
Subjective:   Brittany Mathis is a 76 y.o. female who presents for Medicare Annual (Subsequent) preventive examination.   I connected with Brittany Mathis  today by telephone and verified that I am speaking with the correct person using two identifiers. Location patient: home Location provider: work Persons participating in the virtual visit: patient, provider.   I discussed the limitations, risks, security and privacy concerns of performing an evaluation and management service by telephone and the availability of in person appointments. I also discussed with the patient that there may be a patient responsible charge related to this service. The patient expressed understanding and verbally consented to this telephonic visit.    Interactive audio and video telecommunications were attempted between this provider and patient, however failed, due to patient having technical difficulties OR patient did not have access to video capability.  We continued and completed visit with audio only.    Review of Systems     Cardiac Risk Factors include: advanced age (>1555men, 26>65 women)     Objective:    Today's Vitals   There is no height or weight on file to calculate BMI.     06/05/2022    9:57 AM 06/03/2021   10:23 AM 09/05/2019    7:33 AM 03/05/2019    3:30 PM 10/18/2014   12:09 PM  Advanced Directives  Does Patient Have a Medical Advance Directive? No No No Yes No  Type of Aeronautical engineerAdvance Directive    Healthcare Power of White[place]orney;Living will   Does patient want to make changes to medical advance directive?  No - Patient declined     Copy of Healthcare Power of Attorney in Chart?    No - copy requested   Would patient like information on creating a medical advance directive? No - Patient declined  No - Patient declined  No - patient declined information    Current Medications (verified) Outpatient Encounter Medications as of 06/05/2022  Medication Sig   alendronate (FOSAMAX) 70 MG tablet Take 1 tablet (70  mg total) by mouth every Tuesday. Take with a full glass of water on an empty stomach.   aspirin EC 81 MG tablet Take 81 mg by mouth daily with lunch.    Cholecalciferol (VITAMIN D) 50 MCG (2000 UT) tablet Take 2,000 Units by mouth daily with lunch.   Evolocumab (REPATHA SURECLICK) 140 MG/ML SOAJ ADMINISTER 1 ML UNDER THE SKIN EVERY 14 DAYS   fluticasone-salmeterol (ADVAIR HFA) 115-21 MCG/ACT inhaler Inhale 2 puffs into the lungs as needed (wheezing sob).   metoprolol succinate (TOPROL-XL) 25 MG 24 hr tablet Take 1 tablet (25 mg total) by mouth daily.   nitroGLYCERIN (NITROSTAT) 0.4 MG SL tablet Place 1 tablet (0.4 mg total) under the tongue every 5 (five) minutes as needed for chest pain.   Propylene Glycol (SYSTANE COMPLETE) 0.6 % SOLN Place 1 drop into both eyes daily.   rosuvastatin (CRESTOR) 40 MG tablet TAKE 1 TABLET(40 MG) BY MOUTH DAILY   valsartan (DIOVAN) 160 MG tablet TAKE 1 TABLET(160 MG) BY MOUTH DAILY   estradiol (ESTRACE VAGINAL) 0.1 MG/GM vaginal cream Place 1 g vaginally 2 (two) times a week. Initial dose: nightly x 2 weeks, then every other night x 2 weeks, then twice weekly   Facility-Administered Encounter Medications as of 06/05/2022  Medication   regadenoson (LEXISCAN) injection SOLN 0.4 mg   technetium tetrofosmin (TC-MYOVIEW) injection 29.4 millicurie    Allergies (verified) Patient has no known allergies.   History: Past Medical History:  Diagnosis Date   Carotid artery stenosis    1-39% bilateral by dopplers 11/2019   Cataract    History of shingles    Hyperlipidemia    Hypertension    Nephrolithiasis 1972   Osteoporosis 05/2017   T score -2.7   Thyroid disease 2010   thyroiditis   Past Surgical History:  Procedure Laterality Date   ABDOMINAL HYSTERECTOMY  1983   with BSO  menorrhagia, endometriosis   cataracts Bilateral 2018   LEFT HEART CATH AND CORONARY ANGIOGRAPHY N/A 09/05/2019   Procedure: LEFT HEART CATH AND CORONARY ANGIOGRAPHY;  Surgeon:  [place], Peter M, MD;  Location: [facility] INVASIVE CV LAB;  Service: Cardiovascular;  Laterality: N/A;   OOPHORECTOMY     BSO   Family History  Problem Relation Age of Onset   Breast cancer Mother    Hypertension Mother    Hyperlipidemia Mother    Stroke Mother    Hypertension Father    Hyperlipidemia Father    Glaucoma Father    Kidney disease Father    Colon cancer Maternal Aunt    Social History   Socioeconomic History   Marital status: Married    Spouse name: Not on file   Number of children: Not on file   Years of education: Not on file   Highest education level: Not on file  Occupational History   Not on file  Tobacco Use   Smoking status: Never   Smokeless tobacco: Never   Tobacco comments:    experimented in college  Vaping Use   Vaping Use: Never used  Substance and Sexual Activity   Alcohol use: Not Currently    Alcohol/week: 7.0 standard drinks of alcohol    Types: 7 Glasses of wine per week   Drug use: No   Sexual activity: Not Currently    Birth control/protection: Surgical    Comment: HYST-1st intercourse 21 yo-1 partner  Other Topics Concern   Not on file  Social History Narrative   Not on file   Social Determinants of Health   Financial Resource Strain: Low Risk  (06/05/2022)   Overall Financial Resource Strain (CARDIA)    Difficulty of Paying Living Expenses: Not hard at all  Food Insecurity: No Food Insecurity (06/05/2022)   Hunger Vital Sign    Worried About Running Out of Food in the Last Year: Never true    Ran Out of Food in the Last Year: Never true  Transportation Needs: No Transportation Needs (06/05/2022)   PRAPARE - Administrator, Civil Service (Medical): No    Lack of Transportation (Non-Medical): No  Physical Activity: Insufficiently Active (06/05/2022)   Exercise Vital Sign    Days of Exercise per Week: 3 days    Minutes of Exercise per Session: 30 min  Stress: No Stress Concern Present (06/05/2022)   [employer] of  Occupational Health - Occupational Stress Questionnaire    Feeling of Stress : Not at all  Social Connections: Moderately Integrated (06/05/2022)   Social Connection and Isolation Panel [NHANES]    Frequency of Communication with Friends and Family: Three times a week    Frequency of Social Gatherings with Friends and Family: Three times a week    Attends Religious Services: Never    Active Member of Clubs or Organizations: Yes    Attends Engineer, structural: More than 4 times per year    Marital Status: Married    Tobacco Counseling Counseling given: Not Answered Tobacco comments: experimented in  college   Clinical Intake:  Pre-visit preparation completed: Yes  Pain : No/denies pain     Nutritional Risks: None Diabetes: No  How often do you need to have someone help you when you read instructions, pamphlets, or other written materials from your doctor or pharmacy?: 1 - Never What is the last grade level you completed in school?: college  Diabetic?no   Interpreter Needed?: No  Information entered by :: L.Wilson,LPN   Activities of Daily Living    06/05/2022   10:02 AM  In your present state of health, do you have any difficulty performing the following activities:  Hearing? 0  Vision? 0  Difficulty concentrating or making decisions? 0  Walking or climbing stairs? 0  Dressing or bathing? 0  Doing errands, shopping? 0  Preparing Food and eating ? N  Using the Toilet? N  In the past six months, have you accidently leaked urine? N  Do you have problems with loss of bowel control? N  Managing your Medications? N  Managing your Finances? N  Housekeeping or managing your Housekeeping? N    Patient Care Team: Plotnikov, Georgina Quint, MD as PCP - General (Internal Medicine) Quintella Reichert, MD as PCP - Cardiology (Cardiology) Sharrell Ku, MD (Gastroenterology) Jerilee Field, MD as Attending Physician (Urology) Donzetta Starch, MD as Consulting Physician  (Dermatology) Audie Box, Nadyne Coombes, MD (Inactive) as Consulting Physician (Gynecology) Antony Contras, MD as Consulting Physician (Ophthalmology) Antony Contras, MD as Consulting Physician (Ophthalmology)  Indicate any recent Medical Services you may have received from other than [facility] providers in the past year (date may be approximate).     Assessment:   This is a routine wellness examination for Tersa.  Hearing/Vision screen Vision Screening - Comments:: Annual eye exams   Dietary issues and exercise activities discussed: Current Exercise Habits: Home exercise routine, Type of exercise: walking, Time (Minutes): 30, Frequency (Times/Week): 3, Weekly Exercise (Minutes/Week): 90, Intensity: Mild, Exercise limited by: None identified   Goals Addressed   None    Depression Screen    06/05/2022    9:58 AM 06/05/2022    9:55 AM 06/03/2021   10:22 AM 11/11/2020    9:38 AM 05/11/2020    8:58 AM 03/05/2019    3:31 PM 08/29/2017   10:23 AM  PHQ 2/9 Scores  PHQ - 2 Score 0 0 0 0 0 0 0    Fall Risk    06/05/2022    9:58 AM 06/03/2021   10:24 AM 11/11/2020    9:38 AM 05/11/2020    8:58 AM 03/05/2019    3:31 PM  Fall Risk   Falls in the past year? 0 0 0 0 0  Number falls in past yr: 0 0 0 0 0  Injury with Fall? 0 0 0 0   Risk for fall due to :  No Fall Risks     Follow up Falls evaluation completed;Education provided Falls evaluation completed       FALL RISK PREVENTION PERTAINING TO THE HOME:  Any stairs in or around the home? Yes  If so, are there any without handrails? No  Home free of loose throw rugs in walkways, pet beds, electrical cords, etc? Yes  Adequate lighting in your home to reduce risk of falls? Yes   ASSISTIVE DEVICES UTILIZED TO PREVENT FALLS:  Life alert? No  Use of a cane, walker or w/c? No  Grab bars in the bathroom? No  Shower chair or bench in shower? Yes  Elevated  toilet seat or a handicapped toilet? Yes    Cognitive Function:    Normal cognitive status  assessed by telephone conversation by this Nurse Health Advisor. No abnormalities found.      06/05/2022   10:03 AM  6CIT Screen  What Year? 0 points  What month? 0 points  What time? 0 points  Count back from 20 0 points  Months in reverse 0 points  Repeat phrase 0 points  Total Score 0 points    Immunizations Immunization History  Administered Date(s) Administered   Fluad Quad(high Dose 65+) 07/14/2019, 09/16/2020, 08/24/2021   Influenza, High Dose Seasonal PF 08/29/2018   PFIZER(Purple Top)SARS-COV-2 Vaccination 10/25/2019, 11/15/2019, 08/17/2020, 05/24/2021   Pneumococcal Conjugate-13 09/24/2013   Pneumococcal Polysaccharide-23 02/05/2012   Td 02/05/2012   Zoster, Live 02/13/2013    TDAP status: Due, Education has been provided regarding the importance of this vaccine. Advised may receive this vaccine at local pharmacy or Health Dept. Aware to provide a copy of the vaccination record if obtained from local pharmacy or Health Dept. Verbalized acceptance and understanding.  Flu Vaccine status: Up to date  Pneumococcal vaccine status: Up to date  Covid-19 vaccine status: Completed vaccines  Qualifies for Shingles Vaccine? Yes   Zostavax completed No   Shingrix Completed?: No.    Education has been provided regarding the importance of this vaccine. Patient has been advised to call insurance company to determine out of pocket expense if they have not yet received this vaccine. Advised may also receive vaccine at local pharmacy or Health Dept. Verbalized acceptance and understanding.  Screening Tests Health Maintenance  Topic Date Due   Zoster Vaccines- Shingrix (1 of 2) Never done   COVID-19 Vaccine (5 - Pfizer risk series) 07/19/2021   TETANUS/TDAP  02/04/2022   INFLUENZA VACCINE  05/16/2022   MAMMOGRAM  05/05/2023   Pneumonia Vaccine 77+ Years old  Completed   DEXA SCAN  Completed   Hepatitis C Screening  Completed   HPV VACCINES  Aged Out   COLONOSCOPY (Pts  45-72yrs Insurance coverage will need to be confirmed)  Discontinued    Health Maintenance  Health Maintenance Due  Topic Date Due   Zoster Vaccines- Shingrix (1 of 2) Never done   COVID-19 Vaccine (5 - Pfizer risk series) 07/19/2021   TETANUS/TDAP  02/04/2022   INFLUENZA VACCINE  05/16/2022    Colorectal cancer screening: No longer required.   Mammogram status: No longer required due to age.  Bone Density status: Ordered 05/18/2022. Pt provided with contact info and advised to call to schedule appt.  Lung Cancer Screening: (Low Dose CT Chest recommended if Age 46-80 years, 30 pack-year currently smoking OR have quit w/in 15years.) does not qualify.   Lung Cancer Screening Referral: n/a  Additional Screening:  Hepatitis C Screening: does not qualify;   Vision Screening: Recommended annual ophthalmology exams for early detection of glaucoma and other disorders of the eye. Is the patient up to date with their annual eye exam?  Yes  Who is the provider or what is the name of the office in which the patient attends annual eye exams? Dr.Lyles  If pt is not established with a provider, would they like to be referred to a provider to establish care? No .   Dental Screening: Recommended annual dental exams for proper oral hygiene  Community Resource Referral / Chronic Care Management: CRR required this visit?  No   CCM required this visit?  No      Plan:  I have personally reviewed and noted the following in the patient's chart:   Medical and social history Use of alcohol, tobacco or illicit drugs  Current medications and supplements including opioid prescriptions. Patient is not currently taking opioid prescriptions. Functional ability and status Nutritional status Physical activity Advanced directives List of other physicians Hospitalizations, surgeries, and ER visits in previous 12 months Vitals Screenings to include cognitive, depression, and falls Referrals  and appointments  In addition, I have reviewed and discussed with patient certain preventive protocols, quality metrics, and best practice recommendations. A written personalized care plan for preventive services as well as general preventive health recommendations were provided to patient.     Lorrene Reid, LPN   2/33/0076   Nurse Notes: none    Medical screening examination/treatment/procedure(s) were performed by non-physician practitioner and as supervising physician I was immediately available for consultation/collaboration.  I agree with above. Jacinta Shoe, MD

## 2022-06-05 NOTE — Patient Instructions (Signed)
Brittany Mathis , Thank you for taking time to come for your Medicare Wellness Visit. I appreciate your ongoing commitment to your health goals. Please review the following plan we discussed and let me know if I can assist you in the future.   Screening recommendations/referrals: Colonoscopy: no longer required  Mammogram: no longer required  Bone Density: referral 05/18/2022 Recommended yearly ophthalmology/optometry visit for glaucoma screening and checkup Recommended yearly dental visit for hygiene and checkup  Vaccinations: Influenza vaccine: completed  Pneumococcal vaccine: completed  Tdap vaccine: due  Shingles vaccine: will consider     Advanced directives: none   Conditions/risks identified: none   Next appointment: none    Preventive Care 65 Years and Older, Female Preventive care refers to lifestyle choices and visits with your health care provider that can promote health and wellness. What does preventive care include? A yearly physical exam. This is also called an annual well check. Dental exams once or twice a year. Routine eye exams. Ask your health care provider how often you should have your eyes checked. Personal lifestyle choices, including: Daily care of your teeth and gums. Regular physical activity. Eating a healthy diet. Avoiding tobacco and drug use. Limiting alcohol use. Practicing safe sex. Taking low-dose aspirin every day. Taking vitamin and mineral supplements as recommended by your health care provider. What happens during an annual well check? The services and screenings done by your health care provider during your annual well check will depend on your age, overall health, lifestyle risk factors, and family history of disease. Counseling  Your health care provider may ask you questions about your: Alcohol use. Tobacco use. Drug use. Emotional well-being. Home and relationship well-being. Sexual activity. Eating habits. History of  falls. Memory and ability to understand (cognition). Work and work Astronomer. Reproductive health. Screening  You may have the following tests or measurements: Height, weight, and BMI. Blood pressure. Lipid and cholesterol levels. These may be checked every 5 years, or more frequently if you are over 34 years old. Skin check. Lung cancer screening. You may have this screening every year starting at age 53 if you have a 30-pack-year history of smoking and currently smoke or have quit within the past 15 years. Fecal occult blood test (FOBT) of the stool. You may have this test every year starting at age 39. Flexible sigmoidoscopy or colonoscopy. You may have a sigmoidoscopy every 5 years or a colonoscopy every 10 years starting at age 58. Hepatitis C blood test. Hepatitis B blood test. Sexually transmitted disease (STD) testing. Diabetes screening. This is done by checking your blood sugar (glucose) after you have not eaten for a while (fasting). You may have this done every 1-3 years. Bone density scan. This is done to screen for osteoporosis. You may have this done starting at age 36. Mammogram. This may be done every 1-2 years. Talk to your health care provider about how often you should have regular mammograms. Talk with your health care provider about your test results, treatment options, and if necessary, the need for more tests. Vaccines  Your health care provider may recommend certain vaccines, such as: Influenza vaccine. This is recommended every year. Tetanus, diphtheria, and acellular pertussis (Tdap, Td) vaccine. You may need a Td booster every 10 years. Zoster vaccine. You may need this after age 74. Pneumococcal 13-valent conjugate (PCV13) vaccine. One dose is recommended after age 20. Pneumococcal polysaccharide (PPSV23) vaccine. One dose is recommended after age 27. Talk to your health care provider about which  screenings and vaccines you need and how often you need  them. This information is not intended to replace advice given to you by your health care provider. Make sure you discuss any questions you have with your health care provider. Document Released: 10/29/2015 Document Revised: 06/21/2016 Document Reviewed: 08/03/2015 Elsevier Interactive Patient Education  2017 [place] Prevention in the Home Falls can cause injuries. They can happen to people of all ages. There are many things you can do to make your home safe and to help prevent falls. What can I do on the outside of my home? Regularly fix the edges of walkways and driveways and fix any cracks. Remove anything that might make you trip as you walk through a door, such as a raised step or threshold. Trim any bushes or trees on the path to your home. Use bright outdoor lighting. Clear any walking paths of anything that might make someone trip, such as rocks or tools. Regularly check to see if handrails are loose or broken. Make sure that both sides of any steps have handrails. Any raised decks and porches should have guardrails on the edges. Have any leaves, snow, or ice cleared regularly. Use sand or salt on walking paths during winter. Clean up any spills in your garage right away. This includes oil or grease spills. What can I do in the bathroom? Use night lights. Install grab bars by the toilet and in the tub and shower. Do not use towel bars as grab bars. Use non-skid mats or decals in the tub or shower. If you need to sit down in the shower, use a plastic, non-slip stool. Keep the floor dry. Clean up any water that spills on the floor as soon as it happens. Remove soap buildup in the tub or shower regularly. Attach bath mats securely with double-sided non-slip rug tape. Do not have throw rugs and other things on the floor that can make you trip. What can I do in the bedroom? Use night lights. Make sure that you have a light by your bed that is easy to reach. Do not use  any sheets or blankets that are too big for your bed. They should not hang down onto the floor. Have a firm chair that has side arms. You can use this for support while you get dressed. Do not have throw rugs and other things on the floor that can make you trip. What can I do in the kitchen? Clean up any spills right away. Avoid walking on wet floors. Keep items that you use a lot in easy-to-reach places. If you need to reach something above you, use a strong step stool that has a grab bar. Keep electrical cords out of the way. Do not use floor polish or wax that makes floors slippery. If you must use wax, use non-skid floor wax. Do not have throw rugs and other things on the floor that can make you trip. What can I do with my stairs? Do not leave any items on the stairs. Make sure that there are handrails on both sides of the stairs and use them. Fix handrails that are broken or loose. Make sure that handrails are as long as the stairways. Check any carpeting to make sure that it is firmly attached to the stairs. Fix any carpet that is loose or worn. Avoid having throw rugs at the top or bottom of the stairs. If you do have throw rugs, attach them to the floor with carpet  tape. Make sure that you have a light switch at the top of the stairs and the bottom of the stairs. If you do not have them, ask someone to add them for you. What else can I do to help prevent falls? Wear shoes that: Do not have high heels. Have rubber bottoms. Are comfortable and fit you well. Are closed at the toe. Do not wear sandals. If you use a stepladder: Make sure that it is fully opened. Do not climb a closed stepladder. Make sure that both sides of the stepladder are locked into place. Ask someone to hold it for you, if possible. Clearly mark and make sure that you can see: Any grab bars or handrails. First and last steps. Where the edge of each step is. Use tools that help you move around (mobility aids)  if they are needed. These include: Canes. Walkers. Scooters. Crutches. Turn on the lights when you go into a dark area. Replace any light bulbs as soon as they burn out. Set up your furniture so you have a clear path. Avoid moving your furniture around. If any of your floors are uneven, fix them. If there are any pets around you, be aware of where they are. Review your medicines with your doctor. Some medicines can make you feel dizzy. This can increase your chance of falling. Ask your doctor what other things that you can do to help prevent falls. This information is not intended to replace advice given to you by your health care provider. Make sure you discuss any questions you have with your health care provider. Document Released: 07/29/2009 Document Revised: 03/09/2016 Document Reviewed: 11/06/2014 Elsevier Interactive Patient Education  2017 [employer].

## 2022-06-06 ENCOUNTER — Encounter: Payer: Self-pay | Admitting: Internal Medicine

## 2022-06-06 ENCOUNTER — Ambulatory Visit: Payer: Medicare PPO | Admitting: Internal Medicine

## 2022-06-06 DIAGNOSIS — R7989 Other specified abnormal findings of blood chemistry: Secondary | ICD-10-CM

## 2022-06-06 DIAGNOSIS — R634 Abnormal weight loss: Secondary | ICD-10-CM | POA: Diagnosis not present

## 2022-06-06 DIAGNOSIS — I2510 Atherosclerotic heart disease of native coronary artery without angina pectoris: Secondary | ICD-10-CM

## 2022-06-06 DIAGNOSIS — E782 Mixed hyperlipidemia: Secondary | ICD-10-CM

## 2022-06-06 DIAGNOSIS — I700 Atherosclerosis of aorta: Secondary | ICD-10-CM | POA: Diagnosis not present

## 2022-06-06 LAB — COMPREHENSIVE METABOLIC PANEL
ALT: 35 U/L (ref 0–35)
AST: 34 U/L (ref 0–37)
Albumin: 4.2 g/dL (ref 3.5–5.2)
Alkaline Phosphatase: 46 U/L (ref 39–117)
BUN: 10 mg/dL (ref 6–23)
CO2: 28 mEq/L (ref 19–32)
Calcium: 9.5 mg/dL (ref 8.4–10.5)
Chloride: 97 mEq/L (ref 96–112)
Creatinine, Ser: 0.58 mg/dL (ref 0.40–1.20)
GFR: 87.13 mL/min (ref >60.00)
Glucose, Bld: 98 mg/dL (ref 70–99)
Potassium: 4 mEq/L (ref 3.5–5.1)
Sodium: 132 mEq/L — ABNORMAL LOW (ref 135–145)
Total Bilirubin: 0.7 mg/dL (ref 0.2–1.2)
Total Protein: 6.9 g/dL (ref 6.0–8.3)

## 2022-06-06 LAB — LIPID PANEL
Cholesterol: 126 mg/dL (ref 0–200)
HDL: 81.4 mg/dL (ref >39.00)
LDL Cholesterol: 36 mg/dL (ref 0–99)
NonHDL: 44.47
Total CHOL/HDL Ratio: 2
Triglycerides: 43 mg/dL (ref 0.0–149.0)
VLDL: 8.6 mg/dL (ref 0.0–40.0)

## 2022-06-06 LAB — TSH: TSH: 1.97 u[IU]/mL (ref 0.35–5.50)

## 2022-06-06 NOTE — Progress Notes (Signed)
Subjective:  Patient ID: Brittany Mathis, female    DOB: 29-Jan-1946  Age: 76 y.o. MRN: [id number]  CC: Follow-up (6 month f/u)   HPI Brittany Mathis presents for CAD, HTN, dyslipidemia  Outpatient Medications Prior to Visit  Medication Sig Dispense Refill   alendronate (FOSAMAX) 70 MG tablet Take 1 tablet (70 mg total) by mouth every Tuesday. Take with a full glass of water on an empty stomach. 12 tablet 4   aspirin EC 81 MG tablet Take 81 mg by mouth daily with lunch.      Cholecalciferol (VITAMIN D) 50 MCG (2000 UT) tablet Take 2,000 Units by mouth daily with lunch.     Evolocumab (REPATHA SURECLICK) 140 MG/ML SOAJ ADMINISTER 1 ML UNDER THE SKIN EVERY 14 DAYS 6 mL 3   fluticasone-salmeterol (ADVAIR HFA) 115-21 MCG/ACT inhaler Inhale 2 puffs into the lungs as needed (wheezing sob).     metoprolol succinate (TOPROL-XL) 25 MG 24 hr tablet Take 1 tablet (25 mg total) by mouth daily. 90 tablet 3   nitroGLYCERIN (NITROSTAT) 0.4 MG SL tablet Place 1 tablet (0.4 mg total) under the tongue every 5 (five) minutes as needed for chest pain. 25 tablet 3   Propylene Glycol (SYSTANE COMPLETE) 0.6 % SOLN Place 1 drop into both eyes daily.     rosuvastatin (CRESTOR) 40 MG tablet TAKE 1 TABLET(40 MG) BY MOUTH DAILY 90 tablet 3   valsartan (DIOVAN) 160 MG tablet TAKE 1 TABLET(160 MG) BY MOUTH DAILY 90 tablet 3   estradiol (ESTRACE VAGINAL) 0.1 MG/GM vaginal cream Place 1 g vaginally 2 (two) times a week. Initial dose: nightly x 2 weeks, then every other night x 2 weeks, then twice weekly (Patient not taking: Reported on 06/06/2022) 42.5 g 12   Facility-Administered Medications Prior to Visit  Medication Dose Route Frequency Provider Last Rate Last Admin   regadenoson (LEXISCAN) injection SOLN 0.4 mg  0.4 mg Intravenous Once Chilton Si, MD       technetium tetrofosmin (TC-MYOVIEW) injection 29.4 millicurie  29.4 millicurie Intravenous Once PRN Chilton Si, MD        ROS: Review of  Systems  Objective:  BP (!) 148/80 (BP Location: Left Arm)   Pulse (!) 59   Temp 97.8 F (36.6 C) (Oral)   Ht 5\' 3"  (1.6 m)   Wt 93 lb (42.2 kg)   SpO2 98%   BMI 16.47 kg/m   BP Readings from Last 3 Encounters:  06/06/22 (!) 148/80  05/17/22 (!) 144/76  04/24/22 130/74    Wt Readings from Last 3 Encounters:  06/06/22 93 lb (42.2 kg)  05/17/22 93 lb (42.2 kg)  04/24/22 96 lb 3.2 oz (43.6 kg)    Physical Exam  Lab Results  Component Value Date   WBC 5.1 12/01/2021   HGB 12.9 12/01/2021   HCT 38.8 12/01/2021   PLT 218.0 12/01/2021   GLUCOSE 99 12/01/2021   CHOL 133 12/01/2021   TRIG 36.0 12/01/2021   HDL 94.40 12/01/2021   LDLDIRECT 109.2 02/05/2012   LDLCALC 31 12/01/2021   ALT 41 (H) 12/01/2021   AST 43 (H) 12/01/2021   NA 130 (L) 12/01/2021   K 4.2 12/01/2021   CL 95 (L) 12/01/2021   CREATININE 0.56 12/01/2021   BUN 12 12/01/2021   CO2 32 12/01/2021   TSH 1.97 05/18/2021    VAS 07/18/2021 CAROTID  Result Date: 11/19/2019 Carotid Arterial Duplex Study Indications:  Bilateral bruits. Patient denies any cerebrovascular symptoms, but  says she does have a strong family history of cardiac diseaes and               an elevated coronary calcium score. Risk Factors: Hypertension, hyperlipidemia, no history of smoking. Performing Technologist: Olegario Shearer RVT  Examination Guidelines: A complete evaluation includes B-mode imaging, spectral Doppler, color Doppler, and power Doppler as needed of all accessible portions of each vessel. Bilateral testing is considered an integral part of a complete examination. Limited examinations for reoccurring indications may be performed as noted.  Right Carotid Findings: +----------+--------+--------+--------+------------------+--------+           PSV cm/sEDV cm/sStenosisPlaque DescriptionComments +----------+--------+--------+--------+------------------+--------+ CCA Prox  74      15                                          +----------+--------+--------+--------+------------------+--------+ CCA Distal61      13                                         +----------+--------+--------+--------+------------------+--------+ ICA Prox  54      14      1-39%   heterogenous               +----------+--------+--------+--------+------------------+--------+ ICA Mid   52      18                                         +----------+--------+--------+--------+------------------+--------+ ICA Distal108     30                                         +----------+--------+--------+--------+------------------+--------+ ECA       48      7                                          +----------+--------+--------+--------+------------------+--------+ +----------+--------+-------+----------------+-------------------+           PSV cm/sEDV cmsDescribe        Arm Pressure (mmHG) +----------+--------+-------+----------------+-------------------+ [id number]            Multiphasic, [id number]                 +----------+--------+-------+----------------+-------------------+ +---------+--------+--+--------+--+---------+ VertebralPSV cm/s70EDV cm/s17Antegrade +---------+--------+--+--------+--+---------+  Left Carotid Findings: +----------+--------+--------+--------+------------------+--------+           PSV cm/sEDV cm/sStenosisPlaque DescriptionComments +----------+--------+--------+--------+------------------+--------+ CCA Prox  60      14                                         +----------+--------+--------+--------+------------------+--------+ CCA Distal54      16                                         +----------+--------+--------+--------+------------------+--------+ ICA Prox  60      18  heterogenous               +----------+--------+--------+--------+------------------+--------+ ICA Mid   87      26      1-39%                               +----------+--------+--------+--------+------------------+--------+ ICA Distal89      34                                         +----------+--------+--------+--------+------------------+--------+ ECA       63      5                                          +----------+--------+--------+--------+------------------+--------+ +----------+--------+--------+----------------+-------------------+           PSV cm/sEDV cm/sDescribe        Arm Pressure (mmHG) +----------+--------+--------+----------------+-------------------+ [id number]             Multiphasic, [id number]                 +----------+--------+--------+----------------+-------------------+ +---------+--------+--+--------+-+---------+ VertebralPSV cm/s23EDV cm/s7Antegrade +---------+--------+--+--------+-+---------+   Summary: Right Carotid: Velocities in the right ICA are consistent with a 1-39% stenosis. Left Carotid: Velocities in the left ICA are consistent with a 1-39% stenosis. Vertebrals:  Bilateral vertebral arteries demonstrate antegrade flow. Subclavians: Normal flow hemodynamics were seen in bilateral subclavian              arteries. *See table(s) above for measurements and observations.  Electronically signed by Lance Muss MD on 11/19/2019 at 11:50:52 AM.    Final     Assessment & Plan:   Problem List Items Addressed This Visit     CAD (coronary artery disease)    F/u Dr Mayford Knife Severe 2 vessel obstructive CAD    - 70% LAD proximally at the bifurcation with a very large diagonal branch. Segmental 90% mid LAD stenosis. This vessel is heavily calcified and tortuous    - 90% ostial large first diagonal    - 70% PL branch of the RCA 2. Normal LV function 3. Normal LVEDP   Plan: recommend aggressive medical therapy. Given complexity of her disease she is not a candidate for PCI. If she has refractory angina I would recommend referral for CABG.   Continue on Crestor and Repatha      Elevated LFTs     Will check LFTs      Hyperlipidemia    On Simvastatin, Repatha      Weight loss    Start Boost or Ensure Likely stress related. Good appetite. Not depressed 2 glasses of wine daily         No orders of the defined types were placed in this encounter.     Follow-up: No follow-ups on file.  Sonda Primes, MD

## 2022-06-06 NOTE — Assessment & Plan Note (Signed)
F/u Dr Mayford Knife Severe 2 vessel obstructive CAD    - 70% LAD proximally at the bifurcation with a very large diagonal branch. Segmental 90% mid LAD stenosis. This vessel is heavily calcified and tortuous    - 90% ostial large first diagonal    - 70% PL branch of the RCA 2. Normal LV function 3. Normal LVEDP  Plan: recommend aggressive medical therapy. Given complexity of her disease she is not a candidate for PCI. If she has refractory angina I would recommend referral for CABG.   Continue on Crestor and Repatha

## 2022-06-06 NOTE — Assessment & Plan Note (Signed)
Start Boost or Ensure Likely stress related. Good appetite. Not depressed 2 glasses of wine daily

## 2022-06-06 NOTE — Assessment & Plan Note (Signed)
Will check LFTs  °

## 2022-06-06 NOTE — Assessment & Plan Note (Signed)
On Simvastatin, Repatha

## 2022-06-20 ENCOUNTER — Other Ambulatory Visit: Payer: Self-pay | Admitting: Nurse Practitioner

## 2022-06-20 ENCOUNTER — Ambulatory Visit (INDEPENDENT_AMBULATORY_CARE_PROVIDER_SITE_OTHER): Payer: Medicare PPO

## 2022-06-20 DIAGNOSIS — M8589 Other specified disorders of bone density and structure, multiple sites: Secondary | ICD-10-CM

## 2022-06-20 DIAGNOSIS — M810 Age-related osteoporosis without current pathological fracture: Secondary | ICD-10-CM

## 2022-06-20 DIAGNOSIS — Z780 Asymptomatic menopausal state: Secondary | ICD-10-CM | POA: Diagnosis not present

## 2022-06-20 DIAGNOSIS — Z13820 Encounter for screening for osteoporosis: Secondary | ICD-10-CM | POA: Diagnosis not present

## 2022-07-03 ENCOUNTER — Other Ambulatory Visit: Payer: Self-pay | Admitting: Cardiology

## 2022-07-03 DIAGNOSIS — I2510 Atherosclerotic heart disease of native coronary artery without angina pectoris: Secondary | ICD-10-CM

## 2022-07-03 DIAGNOSIS — E785 Hyperlipidemia, unspecified: Secondary | ICD-10-CM

## 2022-07-28 DIAGNOSIS — H401131 Primary open-angle glaucoma, bilateral, mild stage: Secondary | ICD-10-CM | POA: Diagnosis not present

## 2022-08-15 ENCOUNTER — Ambulatory Visit (INDEPENDENT_AMBULATORY_CARE_PROVIDER_SITE_OTHER): Payer: Medicare PPO | Admitting: *Deleted

## 2022-08-15 DIAGNOSIS — Z23 Encounter for immunization: Secondary | ICD-10-CM | POA: Diagnosis not present

## 2022-08-15 NOTE — Progress Notes (Signed)
Administered high dose flu shot left deltoid. Pt tolerated well. 

## 2022-08-16 DIAGNOSIS — D225 Melanocytic nevi of trunk: Secondary | ICD-10-CM | POA: Diagnosis not present

## 2022-08-16 DIAGNOSIS — C44712 Basal cell carcinoma of skin of right lower limb, including hip: Secondary | ICD-10-CM | POA: Diagnosis not present

## 2022-08-16 DIAGNOSIS — D044 Carcinoma in situ of skin of scalp and neck: Secondary | ICD-10-CM | POA: Diagnosis not present

## 2022-08-16 DIAGNOSIS — L812 Freckles: Secondary | ICD-10-CM | POA: Diagnosis not present

## 2022-08-16 DIAGNOSIS — Z85828 Personal history of other malignant neoplasm of skin: Secondary | ICD-10-CM | POA: Diagnosis not present

## 2022-08-16 DIAGNOSIS — L821 Other seborrheic keratosis: Secondary | ICD-10-CM | POA: Diagnosis not present

## 2022-08-16 DIAGNOSIS — D485 Neoplasm of uncertain behavior of skin: Secondary | ICD-10-CM | POA: Diagnosis not present

## 2022-08-16 DIAGNOSIS — D1801 Hemangioma of skin and subcutaneous tissue: Secondary | ICD-10-CM | POA: Diagnosis not present

## 2022-10-26 ENCOUNTER — Other Ambulatory Visit (HOSPITAL_COMMUNITY): Payer: Self-pay

## 2022-10-27 ENCOUNTER — Other Ambulatory Visit (HOSPITAL_COMMUNITY): Payer: Self-pay

## 2022-12-06 ENCOUNTER — Encounter: Payer: Self-pay | Admitting: Internal Medicine

## 2022-12-06 ENCOUNTER — Ambulatory Visit: Payer: Medicare PPO | Admitting: Internal Medicine

## 2022-12-06 VITALS — BP 140/78 | HR 58 | Ht 63.0 in | Wt 94.8 lb

## 2022-12-06 DIAGNOSIS — E782 Mixed hyperlipidemia: Secondary | ICD-10-CM

## 2022-12-06 DIAGNOSIS — I2510 Atherosclerotic heart disease of native coronary artery without angina pectoris: Secondary | ICD-10-CM | POA: Diagnosis not present

## 2022-12-06 DIAGNOSIS — I10 Essential (primary) hypertension: Secondary | ICD-10-CM

## 2022-12-06 LAB — COMPREHENSIVE METABOLIC PANEL
ALT: 34 U/L (ref 0–35)
AST: 35 U/L (ref 0–37)
Albumin: 4.8 g/dL (ref 3.5–5.2)
Alkaline Phosphatase: 46 U/L (ref 39–117)
BUN: 17 mg/dL (ref 6–23)
CO2: 26 mEq/L (ref 19–32)
Calcium: 10.3 mg/dL (ref 8.4–10.5)
Chloride: 101 mEq/L (ref 96–112)
Creatinine, Ser: 0.63 mg/dL (ref 0.40–1.20)
GFR: 85.11 mL/min (ref >60.00)
Glucose, Bld: 101 mg/dL — ABNORMAL HIGH (ref 70–99)
Potassium: 4.6 mEq/L (ref 3.5–5.1)
Sodium: 138 mEq/L (ref 135–145)
Total Bilirubin: 0.6 mg/dL (ref 0.2–1.2)
Total Protein: 7.7 g/dL (ref 6.0–8.3)

## 2022-12-06 LAB — LIPID PANEL
Cholesterol: 149 mg/dL (ref 0–200)
HDL: 100.6 mg/dL (ref >39.00)
LDL Cholesterol: 41 mg/dL (ref 0–99)
NonHDL: 48.44
Total CHOL/HDL Ratio: 1
Triglycerides: 38 mg/dL (ref 0.0–149.0)
VLDL: 7.6 mg/dL (ref 0.0–40.0)

## 2022-12-06 LAB — TSH: TSH: 2.7 u[IU]/mL (ref 0.35–5.50)

## 2022-12-06 NOTE — Progress Notes (Signed)
Subjective:  Patient ID: Brittany Mathis, female    DOB: 05/16/1946  Age: 77 y.o. MRN: [id number]  CC: No chief complaint on file.   HPI Brittany Mathis presents for wt loss,   Outpatient Medications Prior to Visit  Medication Sig Dispense Refill   alendronate (FOSAMAX) 70 MG tablet Take 1 tablet (70 mg total) by mouth every Tuesday. Take with a full glass of water on an empty stomach. 12 tablet 4   aspirin EC 81 MG tablet Take 81 mg by mouth daily with lunch.      Cholecalciferol (VITAMIN D) 50 MCG (2000 UT) tablet Take 2,000 Units by mouth daily with lunch.     fluticasone-salmeterol (ADVAIR HFA) 115-21 MCG/ACT inhaler Inhale 2 puffs into the lungs as needed (wheezing sob).     metoprolol succinate (TOPROL-XL) 25 MG 24 hr tablet Take 1 tablet (25 mg total) by mouth daily. 90 tablet 3   nitroGLYCERIN (NITROSTAT) 0.4 MG SL tablet Place 1 tablet (0.4 mg total) under the tongue every 5 (five) minutes as needed for chest pain. 25 tablet 3   Propylene Glycol (SYSTANE COMPLETE) 0.6 % SOLN Place 1 drop into both eyes daily.     REPATHA SURECLICK [id number] MG/ML SOAJ ADMINISTER 1 ML UNDER THE SKIN EVERY 14 DAYS 6 mL 3   rosuvastatin (CRESTOR) 40 MG tablet TAKE 1 TABLET(40 MG) BY MOUTH DAILY 90 tablet 3   valsartan (DIOVAN) 160 MG tablet TAKE 1 TABLET(160 MG) BY MOUTH DAILY 90 tablet 3   Facility-Administered Medications Prior to Visit  Medication Dose Route Frequency Provider Last Rate Last Admin   regadenoson (LEXISCAN) injection SOLN 0.4 mg  0.4 mg Intravenous Once Skeet Latch, MD       technetium tetrofosmin (TC-MYOVIEW) injection [id number] millicurie  [id number] millicurie Intravenous Once PRN Skeet Latch, MD        ROS: Review of Systems  Constitutional:  Negative for activity change, appetite change, chills, fatigue and unexpected weight change.  HENT:  Negative for congestion, mouth sores and sinus pressure.   Eyes:  Negative for visual disturbance.  Respiratory:  Negative for cough and chest  tightness.   Gastrointestinal:  Negative for abdominal pain and nausea.  Genitourinary:  Negative for difficulty urinating, frequency and vaginal pain.  Musculoskeletal:  Negative for back pain and gait problem.  Skin:  Negative for pallor and rash.  Neurological:  Negative for dizziness, tremors, weakness, numbness and headaches.  Psychiatric/Behavioral:  Negative for confusion and sleep disturbance.     Objective:  BP (!) 140/78 (BP Location: Right Arm, Patient Position: Sitting, Cuff Size: Small)   Pulse (!) 58   Ht 5' 3"$  (1.6 m)   Wt 94 lb 12.8 oz (43 kg)   SpO2 97%   BMI 16.79 kg/m   BP Readings from Last 3 Encounters:  12/06/22 (!) 140/78  06/06/22 (!) 148/80  05/17/22 (!) 144/76    Wt Readings from Last 3 Encounters:  12/06/22 94 lb 12.8 oz (43 kg)  06/06/22 93 lb (42.2 kg)  05/17/22 93 lb (42.2 kg)    Physical Exam Constitutional:      General: She is not in acute distress.    Appearance: She is well-developed.  HENT:     Head: Normocephalic.     Right Ear: External ear normal.     Left Ear: External ear normal.     Nose: Nose normal.  Eyes:     General:        Right eye:  No discharge.        Left eye: No discharge.     Conjunctiva/sclera: Conjunctivae normal.     Pupils: Pupils are equal, round, and reactive to light.  Neck:     Thyroid: No thyromegaly.     Vascular: No JVD.     Trachea: No tracheal deviation.  Cardiovascular:     Rate and Rhythm: Normal rate and regular rhythm.     Heart sounds: Normal heart sounds.  Pulmonary:     Effort: No respiratory distress.     Breath sounds: No stridor. No wheezing.  Abdominal:     General: Bowel sounds are normal. There is no distension.     Palpations: Abdomen is soft. There is no mass.     Tenderness: There is no abdominal tenderness. There is no guarding or rebound.  Musculoskeletal:        General: No tenderness.     Cervical back: Normal range of motion and neck supple. No rigidity.   Lymphadenopathy:     Cervical: No cervical adenopathy.  Skin:    Findings: No erythema or rash.  Neurological:     Mental Status: She is oriented to person, place, and time.     Cranial Nerves: No cranial nerve deficit.     Motor: No abnormal muscle tone.     Coordination: Coordination normal.     Deep Tendon Reflexes: Reflexes normal.  Psychiatric:        Behavior: Behavior normal.        Thought Content: Thought content normal.        Judgment: Judgment normal.     Lab Results  Component Value Date   WBC 5.1 12/01/2021   HGB 12.9 12/01/2021   HCT 38.8 12/01/2021   PLT 218.0 12/01/2021   GLUCOSE 98 06/06/2022   CHOL 126 06/06/2022   TRIG 43.0 06/06/2022   HDL 81.40 06/06/2022   LDLDIRECT 109.2 02/05/2012   LDLCALC 36 06/06/2022   ALT 35 06/06/2022   AST 34 06/06/2022   NA 132 (L) 06/06/2022   K 4.0 06/06/2022   CL 97 06/06/2022   CREATININE 0.58 06/06/2022   BUN 10 06/06/2022   CO2 28 06/06/2022   TSH 1.97 06/06/2022    VAS US CAROTID  Result Date: 11/19/2019 Carotid Arterial Duplex Study Indications:  Bilateral bruits. Patient denies any cerebrovascular symptoms, but               says she does have a strong family history of cardiac diseaes and               an elevated coronary calcium score. Risk Factors: Hypertension, hyperlipidemia, no history of smoking. Performing Technologist: Mariane Masters RVT  Examination Guidelines: A complete evaluation includes B-mode imaging, spectral Doppler, color Doppler, and power Doppler as needed of all accessible portions of each vessel. Bilateral testing is considered an integral part of a complete examination. Limited examinations for reoccurring indications may be performed as noted.  Right Carotid Findings: +----------+--------+--------+--------+------------------+--------+           PSV cm/sEDV cm/sStenosisPlaque DescriptionComments +----------+--------+--------+--------+------------------+--------+ CCA Prox  74       15                                         +----------+--------+--------+--------+------------------+--------+ CCA Distal61      13                                         +----------+--------+--------+--------+------------------+--------+  ICA Prox  54      14      1-39%   heterogenous               +----------+--------+--------+--------+------------------+--------+ ICA Mid   52      18                                         +----------+--------+--------+--------+------------------+--------+ ICA Distal108     30                                         +----------+--------+--------+--------+------------------+--------+ ECA       48      7                                          +----------+--------+--------+--------+------------------+--------+ +----------+--------+-------+----------------+-------------------+           PSV cm/sEDV cmsDescribe        Arm Pressure (mmHG) +----------+--------+-------+----------------+-------------------+ [id number]            Multiphasic, [id number]                 +----------+--------+-------+----------------+-------------------+ +---------+--------+--+--------+--+---------+ VertebralPSV cm/s70EDV cm/s17Antegrade +---------+--------+--+--------+--+---------+  Left Carotid Findings: +----------+--------+--------+--------+------------------+--------+           PSV cm/sEDV cm/sStenosisPlaque DescriptionComments +----------+--------+--------+--------+------------------+--------+ CCA Prox  60      14                                         +----------+--------+--------+--------+------------------+--------+ CCA Distal54      16                                         +----------+--------+--------+--------+------------------+--------+ ICA Prox  60      18              heterogenous               +----------+--------+--------+--------+------------------+--------+ ICA Mid   87      26      1-39%                               +----------+--------+--------+--------+------------------+--------+ ICA Distal89      34                                         +----------+--------+--------+--------+------------------+--------+ ECA       63      5                                          +----------+--------+--------+--------+------------------+--------+ +----------+--------+--------+----------------+-------------------+           PSV cm/sEDV cm/sDescribe        Arm Pressure (mmHG) +----------+--------+--------+----------------+-------------------+ [id number]  Multiphasic, [id number]                 +----------+--------+--------+----------------+-------------------+ +---------+--------+--+--------+-+---------+ VertebralPSV cm/s23EDV cm/s7Antegrade +---------+--------+--+--------+-+---------+   Summary: Right Carotid: Velocities in the right ICA are consistent with a 1-39% stenosis. Left Carotid: Velocities in the left ICA are consistent with a 1-39% stenosis. Vertebrals:  Bilateral vertebral arteries demonstrate antegrade flow. Subclavians: Normal flow hemodynamics were seen in bilateral subclavian              arteries. *See table(s) above for measurements and observations.  Electronically signed by Larae Grooms MD on 11/19/2019 at 11:50:52 AM.    Final     Assessment & Plan:   Problem List Items Addressed This Visit       Cardiovascular and Mediastinum   Essential hypertension - Primary    Valsartan, Toprol      Relevant Orders   Comprehensive metabolic panel   Lipid panel   TSH   CAD (coronary artery disease)    Continue on Crestor and Repatha      Relevant Orders   Comprehensive metabolic panel   Lipid panel   TSH     Other   Hyperlipidemia    On Crestor, Repatha      Relevant Orders   Comprehensive metabolic panel   Lipid panel   TSH      No orders of the defined types were placed in this encounter.     Follow-up: Return in about 6  months (around 06/06/2023) for a follow-up visit.  Walker Kehr, MD

## 2022-12-06 NOTE — Assessment & Plan Note (Signed)
Valsartan, Toprol

## 2022-12-06 NOTE — Assessment & Plan Note (Signed)
Continue on Crestor and Repatha

## 2022-12-06 NOTE — Assessment & Plan Note (Signed)
On Crestor, Repatha

## 2022-12-29 ENCOUNTER — Other Ambulatory Visit: Payer: Self-pay

## 2022-12-29 MED ORDER — METOPROLOL SUCCINATE ER 25 MG PO TB24: 25.0000 mg | ORAL_TABLET | Freq: Every day | ORAL | 0 refills | Status: DC

## 2022-12-29 MED ORDER — ROSUVASTATIN CALCIUM 40 MG PO TABS: ORAL_TABLET | ORAL | 0 refills | Status: DC

## 2022-12-29 MED ORDER — VALSARTAN 160 MG PO TABS: ORAL_TABLET | ORAL | 0 refills | Status: DC

## 2022-12-29 NOTE — Telephone Encounter (Signed)
Pt's medications were sent to pt's pharmacy as requested. Confirmation received.  

## 2023-01-02 ENCOUNTER — Encounter: Payer: Self-pay | Admitting: Internal Medicine

## 2023-01-02 ENCOUNTER — Ambulatory Visit: Payer: Medicare PPO | Admitting: Internal Medicine

## 2023-01-02 VITALS — BP 122/80 | HR 65 | Temp 97.6°F | Ht 63.0 in | Wt 97.5 lb

## 2023-01-02 DIAGNOSIS — J301 Allergic rhinitis due to pollen: Secondary | ICD-10-CM

## 2023-01-02 DIAGNOSIS — H6123 Impacted cerumen, bilateral: Secondary | ICD-10-CM

## 2023-01-02 MED ORDER — LORATADINE 10 MG PO TABS: 10.0000 mg | ORAL_TABLET | Freq: Every day | ORAL | 3 refills | Status: DC

## 2023-01-02 MED ORDER — NEOMYCIN-POLYMYXIN-HC 3.5-10000-1 OT SOLN: 3.0000 [drp] | Freq: Three times a day (TID) | OTIC | 3 refills | Status: AC

## 2023-01-02 MED ORDER — FLUTICASONE PROPIONATE 50 MCG/ACT NA SUSP: 2.0000 | Freq: Every day | NASAL | 2 refills | Status: DC

## 2023-01-02 NOTE — Progress Notes (Signed)
Subjective:  Patient ID: Brittany Mathis, female    DOB: 1945/12/13  Age: 77 y.o. MRN: [id number]  CC: Nasal Congestion (Post nasal drip, Lt Ear Fullness sensation)   HPI ZAMORAH DENO presents for nasal d/c and B ears being stopped up  Outpatient Medications Prior to Visit  Medication Sig Dispense Refill   alendronate (FOSAMAX) 70 MG tablet Take 1 tablet (70 mg total) by mouth every Tuesday. Take with a full glass of water on an empty stomach. 12 tablet 4   aspirin EC 81 MG tablet Take 81 mg by mouth daily with lunch.      Cholecalciferol (VITAMIN D) 50 MCG (2000 UT) tablet Take 2,000 Units by mouth daily with lunch.     fluticasone-salmeterol (ADVAIR HFA) 115-21 MCG/ACT inhaler Inhale 2 puffs into the lungs as needed (wheezing sob).     latanoprost (XALATAN) 0.005 % ophthalmic solution SMARTSIG:In Eye(s)     metoprolol succinate (TOPROL-XL) 25 MG 24 hr tablet Take 1 tablet (25 mg total) by mouth daily. 90 tablet 0   nitroGLYCERIN (NITROSTAT) 0.4 MG SL tablet Place 1 tablet (0.4 mg total) under the tongue every 5 (five) minutes as needed for chest pain. 25 tablet 3   Propylene Glycol (SYSTANE COMPLETE) 0.6 % SOLN Place 1 drop into both eyes daily.     REPATHA SURECLICK [id number] MG/ML SOAJ ADMINISTER 1 ML UNDER THE SKIN EVERY 14 DAYS 6 mL 3   rosuvastatin (CRESTOR) 40 MG tablet TAKE 1 TABLET(40 MG) BY MOUTH DAILY 90 tablet 0   valsartan (DIOVAN) 160 MG tablet TAKE 1 TABLET(160 MG) BY MOUTH DAILY 90 tablet 0   Facility-Administered Medications Prior to Visit  Medication Dose Route Frequency Provider Last Rate Last Admin   regadenoson (LEXISCAN) injection SOLN 0.4 mg  0.4 mg Intravenous Once Skeet Latch, MD       technetium tetrofosmin (TC-MYOVIEW) injection [id number] millicurie  [id number] millicurie Intravenous Once PRN Skeet Latch, MD        ROS: Review of Systems  Constitutional:  Negative for activity change, appetite change, chills, fatigue, fever and unexpected weight change.  HENT:   Positive for congestion, hearing loss, postnasal drip and rhinorrhea. Negative for ear discharge, mouth sores and sinus pressure.   Eyes:  Negative for visual disturbance.  Respiratory:  Negative for cough and chest tightness.   Gastrointestinal:  Negative for abdominal pain and nausea.  Genitourinary:  Negative for difficulty urinating, frequency and vaginal pain.  Musculoskeletal:  Negative for back pain and gait problem.  Skin:  Negative for pallor and rash.  Neurological:  Negative for dizziness, tremors, weakness, numbness and headaches.  Psychiatric/Behavioral:  Negative for confusion and sleep disturbance.     Objective:  BP 122/80 (BP Location: Left Arm, Patient Position: Sitting, Cuff Size: Normal)   Pulse 65   Temp 97.6 F (36.4 C) (Oral)   Ht 5\' 3"  (1.6 m)   Wt 97 lb 8 oz (44.2 kg)   SpO2 98%   BMI 17.27 kg/m   BP Readings from Last 3 Encounters:  01/02/23 122/80  12/06/22 (!) 140/78  06/06/22 (!) 148/80    Wt Readings from Last 3 Encounters:  01/02/23 97 lb 8 oz (44.2 kg)  12/06/22 94 lb 12.8 oz (43 kg)  06/06/22 93 lb (42.2 kg)    Physical Exam Constitutional:      General: She is not in acute distress.    Appearance: She is well-developed.  HENT:     Head: Normocephalic.  Right Ear: External ear normal.     Left Ear: External ear normal.     Nose: Nose normal.  Eyes:     General:        Right eye: No discharge.        Left eye: No discharge.     Conjunctiva/sclera: Conjunctivae normal.     Pupils: Pupils are equal, round, and reactive to light.  Neck:     Thyroid: No thyromegaly.     Vascular: No JVD.     Trachea: No tracheal deviation.  Cardiovascular:     Rate and Rhythm: Normal rate and regular rhythm.     Heart sounds: Normal heart sounds.  Pulmonary:     Effort: No respiratory distress.     Breath sounds: No stridor. No wheezing.  Abdominal:     General: Bowel sounds are normal. There is no distension.     Palpations: Abdomen is soft.  There is no mass.     Tenderness: There is no abdominal tenderness. There is no guarding or rebound.  Musculoskeletal:        General: No tenderness.     Cervical back: Normal range of motion and neck supple. No rigidity.  Lymphadenopathy:     Cervical: No cervical adenopathy.  Skin:    Findings: No erythema or rash.  Neurological:     Cranial Nerves: No cranial nerve deficit.     Motor: No abnormal muscle tone.     Coordination: Coordination normal.     Deep Tendon Reflexes: Reflexes normal.  Psychiatric:        Behavior: Behavior normal.        Thought Content: Thought content normal.        Judgment: Judgment normal.    L ear - painful  B wax   Procedure Note :     Procedure :  Ear irrigation right and left ears   Indication:  Cerumen impaction right and left ears   Risks, including pain, dizziness, eardrum perforation, bleeding, infection and others as well as benefits were explained to the patient in detail. Verbal consent was obtained and the patient agreed to proceed.    We used "The Elephant Ear Irrigation Device" filled with lukewarm water for irrigation. A large amount wax was recovered from both ears. Procedure has also required manual wax removal/instrumentation with an ear wax curette and ear forceps on the right and left ears.  Left ear was irrigated completely.  Right ear irrigation was terminated due to pain complications: Pain in the end of irrigation, dizziness.  Resolved after the patient stated down for 10 or 15 minutes.   Postprocedure instructions :  Call if problems.  Lab Results  Component Value Date   WBC 5.1 12/01/2021   HGB 12.9 12/01/2021   HCT 38.8 12/01/2021   PLT 218.0 12/01/2021   GLUCOSE 101 (H) 12/06/2022   CHOL 149 12/06/2022   TRIG 38.0 12/06/2022   HDL 100.60 12/06/2022   LDLDIRECT 109.2 02/05/2012   LDLCALC 41 12/06/2022   ALT 34 12/06/2022   AST 35 12/06/2022   NA 138 12/06/2022   K 4.6 12/06/2022   CL 101 12/06/2022    CREATININE 0.63 12/06/2022   BUN 17 12/06/2022   CO2 26 12/06/2022   TSH 2.70 12/06/2022    VAS US CAROTID  Result Date: 11/19/2019 Carotid Arterial Duplex Study Indications:  Bilateral bruits. Patient denies any cerebrovascular symptoms, but  says she does have a strong family history of cardiac diseaes and               an elevated coronary calcium score. Risk Factors: Hypertension, hyperlipidemia, no history of smoking. Performing Technologist: Mariane Masters RVT  Examination Guidelines: A complete evaluation includes B-mode imaging, spectral Doppler, color Doppler, and power Doppler as needed of all accessible portions of each vessel. Bilateral testing is considered an integral part of a complete examination. Limited examinations for reoccurring indications may be performed as noted.  Right Carotid Findings: +----------+--------+--------+--------+------------------+--------+           PSV cm/sEDV cm/sStenosisPlaque DescriptionComments +----------+--------+--------+--------+------------------+--------+ CCA Prox  74      15                                         +----------+--------+--------+--------+------------------+--------+ CCA Distal61      13                                         +----------+--------+--------+--------+------------------+--------+ ICA Prox  54      14      1-39%   heterogenous               +----------+--------+--------+--------+------------------+--------+ ICA Mid   52      18                                         +----------+--------+--------+--------+------------------+--------+ ICA Distal108     30                                         +----------+--------+--------+--------+------------------+--------+ ECA       48      7                                          +----------+--------+--------+--------+------------------+--------+ +----------+--------+-------+----------------+-------------------+           PSV  cm/sEDV cmsDescribe        Arm Pressure (mmHG) +----------+--------+-------+----------------+-------------------+ [id number]            Multiphasic, [id number]                 +----------+--------+-------+----------------+-------------------+ +---------+--------+--+--------+--+---------+ VertebralPSV cm/s70EDV cm/s17Antegrade +---------+--------+--+--------+--+---------+  Left Carotid Findings: +----------+--------+--------+--------+------------------+--------+           PSV cm/sEDV cm/sStenosisPlaque DescriptionComments +----------+--------+--------+--------+------------------+--------+ CCA Prox  60      14                                         +----------+--------+--------+--------+------------------+--------+ CCA Distal54      16                                         +----------+--------+--------+--------+------------------+--------+ ICA Prox  60      18  heterogenous               +----------+--------+--------+--------+------------------+--------+ ICA Mid   87      26      1-39%                              +----------+--------+--------+--------+------------------+--------+ ICA Distal89      34                                         +----------+--------+--------+--------+------------------+--------+ ECA       63      5                                          +----------+--------+--------+--------+------------------+--------+ +----------+--------+--------+----------------+-------------------+           PSV cm/sEDV cm/sDescribe        Arm Pressure (mmHG) +----------+--------+--------+----------------+-------------------+ [id number]             Multiphasic, [id number]                 +----------+--------+--------+----------------+-------------------+ +---------+--------+--+--------+-+---------+ VertebralPSV cm/s23EDV cm/s7Antegrade +---------+--------+--+--------+-+---------+   Summary: Right Carotid: Velocities in  the right ICA are consistent with a 1-39% stenosis. Left Carotid: Velocities in the left ICA are consistent with a 1-39% stenosis. Vertebrals:  Bilateral vertebral arteries demonstrate antegrade flow. Subclavians: Normal flow hemodynamics were seen in bilateral subclavian              arteries. *See table(s) above for measurements and observations.  Electronically signed by Larae Grooms MD on 11/19/2019 at 11:50:52 AM.    Final     Assessment & Plan:   Problem List Items Addressed This Visit       Respiratory   Allergic rhinitis     Use Claritin and Flonase seasonally        Nervous and Auditory   Cerumen impaction - Primary    B wax See procedure Use Cortisporin eardrops if pain relapsed.         Meds ordered this encounter  Medications   DISCONTD: loratadine (CLARITIN) 10 MG tablet    Sig: Take 1 tablet (10 mg total) by mouth daily.    Dispense:  100 tablet    Refill:  3   fluticasone (FLONASE) 50 MCG/ACT nasal spray    Sig: Place 2 sprays into both nostrils daily.    Dispense:  16 g    Refill:  2   neomycin-polymyxin-hydrocortisone (CORTISPORIN) OTIC solution    Sig: Place 3 drops into the left ear 3 (three) times daily.    Dispense:  10 mL    Refill:  3   loratadine (CLARITIN) 10 MG tablet    Sig: Take 1 tablet (10 mg total) by mouth daily.    Dispense:  100 tablet    Refill:  3      Follow-up: No follow-ups on file.  Walker Kehr, MD

## 2023-01-02 NOTE — Assessment & Plan Note (Addendum)
B wax See procedure Use Cortisporin eardrops if pain relapsed.

## 2023-01-02 NOTE — Assessment & Plan Note (Signed)
Use Claritin and Flonase seasonally

## 2023-01-03 ENCOUNTER — Telehealth: Payer: Self-pay | Admitting: Internal Medicine

## 2023-01-03 MED ORDER — LORATADINE 10 MG PO TABS: 10.0000 mg | ORAL_TABLET | Freq: Every day | ORAL | 3 refills | Status: DC

## 2023-01-03 NOTE — Telephone Encounter (Signed)
Pt called  and stated that the Pharmacy never received Rx loratadine (CLARITIN) 10 MG tablet that was prescribe yesterday.

## 2023-01-03 NOTE — Telephone Encounter (Signed)
Notified pt resent Loratadine to [facility].Marland KitchenJohny Mathis

## 2023-01-07 MED ORDER — LORATADINE 10 MG PO TABS: 10.0000 mg | ORAL_TABLET | Freq: Every day | ORAL | 3 refills | Status: DC

## 2023-02-05 DIAGNOSIS — Z961 Presence of intraocular lens: Secondary | ICD-10-CM | POA: Diagnosis not present

## 2023-02-05 DIAGNOSIS — H401131 Primary open-angle glaucoma, bilateral, mild stage: Secondary | ICD-10-CM | POA: Diagnosis not present

## 2023-02-08 ENCOUNTER — Ambulatory Visit: Payer: Medicare PPO | Admitting: Cardiology

## 2023-02-14 DIAGNOSIS — L821 Other seborrheic keratosis: Secondary | ICD-10-CM | POA: Diagnosis not present

## 2023-02-14 DIAGNOSIS — L570 Actinic keratosis: Secondary | ICD-10-CM | POA: Diagnosis not present

## 2023-02-14 DIAGNOSIS — Z85828 Personal history of other malignant neoplasm of skin: Secondary | ICD-10-CM | POA: Diagnosis not present

## 2023-03-21 ENCOUNTER — Other Ambulatory Visit: Payer: Self-pay | Admitting: Cardiology

## 2023-04-09 ENCOUNTER — Ambulatory Visit: Payer: Medicare PPO | Attending: Cardiology | Admitting: Cardiology

## 2023-04-09 ENCOUNTER — Encounter: Payer: Self-pay | Admitting: Cardiology

## 2023-04-09 VITALS — BP 138/82 | HR 60 | Ht 63.0 in | Wt 96.2 lb

## 2023-04-09 DIAGNOSIS — E785 Hyperlipidemia, unspecified: Secondary | ICD-10-CM

## 2023-04-09 DIAGNOSIS — I2510 Atherosclerotic heart disease of native coronary artery without angina pectoris: Secondary | ICD-10-CM

## 2023-04-09 DIAGNOSIS — I10 Essential (primary) hypertension: Secondary | ICD-10-CM

## 2023-04-09 DIAGNOSIS — E7800 Pure hypercholesterolemia, unspecified: Secondary | ICD-10-CM | POA: Diagnosis not present

## 2023-04-09 MED ORDER — ROSUVASTATIN CALCIUM 40 MG PO TABS: ORAL_TABLET | ORAL | 3 refills | Status: DC

## 2023-04-09 MED ORDER — REPATHA SURECLICK 140 MG/ML ~~LOC~~ SOAJ
SUBCUTANEOUS | 3 refills | Status: DC
Start: 2023-04-09 — End: 2024-05-12

## 2023-04-09 MED ORDER — METOPROLOL SUCCINATE ER 25 MG PO TB24: 25.0000 mg | ORAL_TABLET | Freq: Every day | ORAL | 3 refills | Status: DC

## 2023-04-09 NOTE — Addendum Note (Signed)
Addended by: Rexene Edison L on: 04/09/2023 11:40 AM   Modules accepted: Orders

## 2023-04-09 NOTE — Patient Instructions (Signed)
Medication Instructions:  Your physician recommends that you continue on your current medications as directed. Please refer to the Current Medication list given to you today.  *If you need a refill on your cardiac medications before your next appointment, please call your pharmacy*   Lab Work: None.  If you have labs (blood work) drawn today and your tests are completely normal, you will receive your results only by: MyChart Message (if you have MyChart) OR A paper copy in the mail If you have any lab test that is abnormal or we need to change your treatment, we will call you to review the results.   Testing/Procedures: None.   Follow-Up: At [facility], you and your health needs are our priority.  As part of our continuing mission to provide you with exceptional heart care, we have created designated Provider Care Teams.  These Care Teams include your primary Cardiologist (physician) and Advanced Practice Providers (APPs -  Physician Assistants and Nurse Practitioners) who all work together to provide you with the care you need, when you need it.   Your next appointment:   1 year(s)  Provider:   Armanda Magic, MD

## 2023-04-09 NOTE — Progress Notes (Signed)
Cardiology Office Note:    Date:  04/09/2023   ID:  Brittany Mathis, DOB 03-28-1946, MRN [id number]  PCP:  Tresa Garter, MD  Cardiologist:  Armanda Magic, MD    Referring MD: Tresa Garter, MD   Chief Complaint  Patient presents with   Coronary Artery Disease   Hyperlipidemia    History of Present Illness:    Brittany Mathis is a 77 y.o. female with a hx of HTN and HLD who underwent risk factor assessment for CAD with coronary artery calcium screening and was found to have a calcium score of 1431. She has  a strong family hx of CAD with her father having an MI in his 19's.  Her mom had a CVA.  She had CRF besides her fm hx including HTN and HLD.   She underwent cardiac cath in 2020 for CP showing 70% RPAV, 25% RCA, 70% pLAD, 90% mLAD, 90% D1 and normal LVF.  Aggressive medical therapy was recommended.  It was recommended at the time of cath to pursue CABG for refractory angina as she was not a candidate for PCI due to complexity of her disease.   She is here today for followup and is doing well.  She denies any chest pain or pressure, SOB, DOE, PND, orthopnea, LE edema, dizziness, palpitations or syncope. She is compliant with her meds and is tolerating meds with no SE.    Past Medical History:  Diagnosis Date   Carotid artery stenosis    1-39% bilateral by dopplers 11/2019   Cataract    History of shingles    Hyperlipidemia    Hypertension    Nephrolithiasis 1972   Osteoporosis 05/2017   T score -2.7   Thyroid disease 2010   thyroiditis    Past Surgical History:  Procedure Laterality Date   ABDOMINAL HYSTERECTOMY  1983   with BSO  menorrhagia, endometriosis   cataracts Bilateral 2018   LEFT HEART CATH AND CORONARY ANGIOGRAPHY N/A 09/05/2019   Procedure: LEFT HEART CATH AND CORONARY ANGIOGRAPHY;  Surgeon: Swaziland, Peter M, MD;  Location: [facility] INVASIVE CV LAB;  Service: Cardiovascular;  Laterality: N/A;   OOPHORECTOMY     BSO    Current Medications: Current Meds   Medication Sig   alendronate (FOSAMAX) 70 MG tablet Take 1 tablet (70 mg total) by mouth every Tuesday. Take with a full glass of water on an empty stomach.   aspirin EC 81 MG tablet Take 81 mg by mouth daily with lunch.    Cholecalciferol (VITAMIN D) 50 MCG (2000 UT) tablet Take 2,000 Units by mouth daily with lunch.   fluticasone-salmeterol (ADVAIR HFA) 115-21 MCG/ACT inhaler Inhale 2 puffs into the lungs as needed (wheezing sob).   metoprolol succinate (TOPROL-XL) 25 MG 24 hr tablet Take 1 tablet (25 mg total) by mouth daily.   nitroGLYCERIN (NITROSTAT) 0.4 MG SL tablet Place 1 tablet (0.4 mg total) under the tongue every 5 (five) minutes as needed for chest pain.   Propylene Glycol (SYSTANE COMPLETE) 0.6 % SOLN Place 1 drop into both eyes daily.   REPATHA SURECLICK 140 MG/ML SOAJ ADMINISTER 1 ML UNDER THE SKIN EVERY 14 DAYS   rosuvastatin (CRESTOR) 40 MG tablet TAKE 1 TABLET(40 MG) BY MOUTH DAILY   valsartan (DIOVAN) 160 MG tablet TAKE 1 TABLET(160 MG) BY MOUTH DAILY     Allergies:   Patient has no known allergies.   Social History   Socioeconomic History   Marital status: Married  Spouse name: Not on file   Number of children: Not on file   Years of education: Not on file   Highest education level: Not on file  Occupational History   Not on file  Tobacco Use   Smoking status: Never   Smokeless tobacco: Never   Tobacco comments:    experimented in college  Vaping Use   Vaping Use: Never used  Substance and Sexual Activity   Alcohol use: Not Currently    Alcohol/week: 7.0 standard drinks of alcohol    Types: 7 Glasses of wine per week   Drug use: No   Sexual activity: Not Currently    Birth control/protection: Surgical    Comment: HYST-1st intercourse 21 yo-1 partner  Other Topics Concern   Not on file  Social History Narrative   Not on file   Social Determinants of Health   Financial Resource Strain: Low Risk  (06/05/2022)   Overall Financial Resource Strain  (CARDIA)    Difficulty of Paying Living Expenses: Not hard at all  Food Insecurity: No Food Insecurity (06/05/2022)   Hunger Vital Sign    Worried About Running Out of Food in the Last Year: Never true    Ran Out of Food in the Last Year: Never true  Transportation Needs: No Transportation Needs (06/05/2022)   PRAPARE - Administrator, Civil Service (Medical): No    Lack of Transportation (Non-Medical): No  Physical Activity: Insufficiently Active (06/05/2022)   Exercise Vital Sign    Days of Exercise per Week: 3 days    Minutes of Exercise per Session: 30 min  Stress: No Stress Concern Present (06/05/2022)   [employer] of Occupational Health - Occupational Stress Questionnaire    Feeling of Stress : Not at all  Social Connections: Moderately Integrated (06/05/2022)   Social Connection and Isolation Panel [NHANES]    Frequency of Communication with Friends and Family: Three times a week    Frequency of Social Gatherings with Friends and Family: Three times a week    Attends Religious Services: Never    Active Member of Clubs or Organizations: Yes    Attends Engineer, structural: More than 4 times per year    Marital Status: Married     Family History: The patient's family history includes Breast cancer in her mother; Colon cancer in her maternal aunt; Glaucoma in her father; Hyperlipidemia in her father and mother; Hypertension in her father and mother; Kidney disease in her father; Stroke in her mother.  ROS:   Please see the history of present illness.    ROS  All other systems reviewed and negative.   EKGs/Labs/Other Studies Reviewed:    The following studies were reviewed today: Cardiac cath, labs from PCP  EKG Interpretation  Date/Time:  Monday April 09 2023 11:33:18 EDT Ventricular Rate:  60 PR Interval:  186 QRS Duration: 92 QT Interval:  478 QTC Calculation: 478 R Axis:   -37 Text Interpretation: Normal sinus rhythm Left axis deviation  Incomplete right bundle branch block Anterior infarct , age undetermined No previous ECGs available Confirmed by Armanda Magic [phone]) on 04/09/2023 11:34:53 AM   Recent Labs: 12/06/2022: ALT 34; BUN 17; Creatinine, Ser 0.63; Potassium 4.6; Sodium 138; TSH 2.70   Recent Lipid Panel    Component Value Date/Time   CHOL 149 12/06/2022 0857   CHOL 134 04/20/2021 0819   TRIG 38.0 12/06/2022 0857   HDL 100.60 12/06/2022 0857   HDL 87 04/20/2021 0819  CHOLHDL 1 12/06/2022 0857   VLDL 7.6 12/06/2022 0857   LDLCALC 41 12/06/2022 0857   LDLCALC 36 04/20/2021 0819   LDLDIRECT 109.2 02/05/2012 0905    Physical Exam:    VS:  BP 138/82   Pulse 60   Ht 5\' 3"  (1.6 m)   Wt 96 lb 3.2 oz (43.6 kg)   BMI 17.04 kg/m     Wt Readings from Last 3 Encounters:  04/09/23 96 lb 3.2 oz (43.6 kg)  01/02/23 97 lb 8 oz (44.2 kg)  12/06/22 94 lb 12.8 oz (43 kg)    GEN: Well nourished, well developed in no acute distress HEENT: Normal NECK: No JVD; No carotid bruits LYMPHATICS: No lymphadenopathy CARDIAC:RRR, no murmurs, rubs, gallops RESPIRATORY:  Clear to auscultation without rales, wheezing or rhonchi  ABDOMEN: Soft, non-tender, non-distended MUSCULOSKELETAL:  No edema; No deformity  SKIN: Warm and dry NEUROLOGIC:  Alert and oriented x 3 PSYCHIATRIC:  Normal affect  ASSESSMENT:    1. Coronary artery disease involving native coronary artery of native heart without angina pectoris   2. Essential hypertension   3. Pure hypercholesterolemia    PLAN:    In order of problems listed above:  1.  ASCAD -cath showing 70% RPAV, 25% RCA, 70% pLAD, 90% mLAD, 90% D1 -It was recommended at the time of cath to pursue CABG for refractory angina as she was not a candidate for PCI due to complexity of her disease.  -She denies any anginal symptoms -Continue prescription drug management with aspirin 81 mg daily, Toprol-XL 25 mg daily, Repatha, Crestor 40 mg daily with as needed refills  2.  HTN -BP is  adequately controlled on exam today -Continue drug management with Toprol-XL 25 mg daily, Diovan 160 mg daily with as needed refills -I have personally reviewed and interpreted outside labs performed by patient's PCP which showed serum creatinine 0.63 and potassium 4.6 on 12/06/2022  3.  HLD -LDL goal < 70 -I have personally reviewed and interpreted outside labs performed by patient's PCP which showed LDL 41 HDL 100 on 12/06/2022 -Continue prescription drug management with Crestor 40 mg daily and Repatha with as needed refills   Followup with me in 1 year  Medication Adjustments/Labs and Tests Ordered: Current medicines are reviewed at length with the patient today.  Concerns regarding medicines are outlined above.  Orders Placed This Encounter  Procedures   EKG 12-Lead   No orders of the defined types were placed in this encounter.   Signed, Armanda Magic, MD  04/09/2023 11:35 AM    [facility]

## 2023-05-09 DIAGNOSIS — Z1231 Encounter for screening mammogram for malignant neoplasm of breast: Secondary | ICD-10-CM | POA: Diagnosis not present

## 2023-05-09 LAB — HM MAMMOGRAPHY

## 2023-05-10 ENCOUNTER — Telehealth: Payer: Self-pay | Admitting: Cardiology

## 2023-05-10 NOTE — Telephone Encounter (Signed)
Left message for patient - I do not see any reason why she could not have the mammo+heart screening test. Advised to call back with any further questions.

## 2023-05-10 NOTE — Telephone Encounter (Signed)
Spoke with the patient who states that she already had her mammogram done and they are able to run the heart portion for up to three months after mammogram has been done. She states that insurance dose not cover it so she is wondering if she needed to have them add the heart screening portion on. She has had extensive cardiac workup and has been feeling good recently so she didn't think she would need this done. Advised that I agree with her plan.

## 2023-05-10 NOTE — Telephone Encounter (Signed)
Pt was returning nurse call and is requesting a callback. Please advise. 

## 2023-05-10 NOTE — Telephone Encounter (Signed)
New Message:     Patient wants to know if it will be alright  for her to take a Mammo+heart test?

## 2023-05-10 NOTE — Telephone Encounter (Signed)
Patient is returning call. Transferred to Ray, Charity fundraiser.

## 2023-05-10 NOTE — Telephone Encounter (Signed)
Left message for patient to call back  

## 2023-05-16 ENCOUNTER — Encounter (INDEPENDENT_AMBULATORY_CARE_PROVIDER_SITE_OTHER): Payer: Self-pay

## 2023-05-29 ENCOUNTER — Ambulatory Visit (INDEPENDENT_AMBULATORY_CARE_PROVIDER_SITE_OTHER): Payer: Medicare PPO

## 2023-05-29 VITALS — Ht 64.0 in | Wt 96.0 lb

## 2023-05-29 DIAGNOSIS — Z0000 Encounter for general adult medical examination without abnormal findings: Secondary | ICD-10-CM

## 2023-05-29 NOTE — Progress Notes (Addendum)
Subjective:   Brittany Mathis is a 77 y.o. female who presents for Medicare Annual (Subsequent) preventive examination.  Visit Complete: Virtual  I connected with  Will Bonnet Lawrie on 05/29/23 by a audio enabled telemedicine application and verified that I am speaking with the correct person using two identifiers.  Patient Location: Home  Provider Location: Home Office  I discussed the limitations of evaluation and management by telemedicine. The patient expressed understanding and agreed to proceed.  Patient Medicare AWV questionnaire was completed by the patient on 05/28/2023; I have confirmed that all information answered by patient is correct and no changes since this date.  Vital Signs: Because this visit was a virtual/telehealth visit, some criteria may be missing or patient reported. Any vitals not documented were not able to be obtained and vitals that have been documented are patient reported.    Review of Systems    Cardiac Risk Factors include: advanced age (>63men, >76 women);dyslipidemia;hypertension;Other (see comment), Risk factor comments: CAD, Osteoporosis     Objective:    Today's Vitals   05/29/23 0949  Weight: 96 lb (43.5 kg)  Height: 5\' 4"  (1.626 m)   Body mass index is 16.48 kg/m.     05/29/2023    9:53 AM 06/05/2022    9:57 AM 06/03/2021   10:23 AM 09/05/2019    7:33 AM 03/05/2019    3:30 PM 10/18/2014   12:09 PM  Advanced Directives  Does Patient Have a Medical Advance Directive? Yes No No No Yes No  Type of Estate agent of [place];Living will    Healthcare Power of [place];Living will   Does patient want to make changes to medical advance directive?   No - Patient declined     Copy of Healthcare Power of Attorney in Chart? No - copy requested    No - copy requested   Would patient like information on creating a medical advance directive?  No - Patient declined  No - Patient declined  No - patient declined information    Current  Medications (verified) Outpatient Encounter Medications as of 05/29/2023  Medication Sig   alendronate (FOSAMAX) 70 MG tablet Take 1 tablet (70 mg total) by mouth every Tuesday. Take with a full glass of water on an empty stomach.   aspirin EC 81 MG tablet Take 81 mg by mouth daily with lunch.    Cholecalciferol (VITAMIN D) 50 MCG (2000 UT) tablet Take 2,000 Units by mouth daily with lunch.   Evolocumab (REPATHA SURECLICK) 140 MG/ML SOAJ ADMINISTER 1 ML UNDER THE SKIN EVERY 14 DAYS   fluticasone (FLONASE) 50 MCG/ACT nasal spray Place 2 sprays into both nostrils daily.   fluticasone-salmeterol (ADVAIR HFA) 115-21 MCG/ACT inhaler Inhale 2 puffs into the lungs as needed (wheezing sob).   metoprolol succinate (TOPROL-XL) 25 MG 24 hr tablet Take 1 tablet (25 mg total) by mouth daily.   nitroGLYCERIN (NITROSTAT) 0.4 MG SL tablet Place 1 tablet (0.4 mg total) under the tongue every 5 (five) minutes as needed for chest pain.   Propylene Glycol (SYSTANE COMPLETE) 0.6 % SOLN Place 1 drop into both eyes daily.   rosuvastatin (CRESTOR) 40 MG tablet TAKE 1 TABLET(40 MG) BY MOUTH DAILY   valsartan (DIOVAN) 160 MG tablet TAKE 1 TABLET(160 MG) BY MOUTH DAILY   latanoprost (XALATAN) 0.005 % ophthalmic solution SMARTSIG:In Eye(s) (Patient not taking: Reported on 04/09/2023)   loratadine (CLARITIN) 10 MG tablet Take 1 tablet (10 mg total) by mouth daily. (Patient not taking:  Reported on 04/09/2023)   Facility-Administered Encounter Medications as of 05/29/2023  Medication   regadenoson (LEXISCAN) injection SOLN 0.4 mg   technetium tetrofosmin (TC-MYOVIEW) injection 29.4 millicurie    Allergies (verified) Patient has no known allergies.   History: Past Medical History:  Diagnosis Date   Carotid artery stenosis    1-39% bilateral by dopplers 11/2019   Cataract    History of shingles    Hyperlipidemia    Hypertension    Nephrolithiasis 1972   Osteoporosis 05/2017   T score -2.7   Thyroid disease 2010    thyroiditis   Past Surgical History:  Procedure Laterality Date   ABDOMINAL HYSTERECTOMY  1983   with BSO  menorrhagia, endometriosis   cataracts Bilateral 2018   LEFT HEART CATH AND CORONARY ANGIOGRAPHY N/A 09/05/2019   Procedure: LEFT HEART CATH AND CORONARY ANGIOGRAPHY;  Surgeon: Swaziland, Peter M, MD;  Location: [facility] INVASIVE CV LAB;  Service: Cardiovascular;  Laterality: N/A;   OOPHORECTOMY     BSO   Family History  Problem Relation Age of Onset   Breast cancer Mother    Hypertension Mother    Hyperlipidemia Mother    Stroke Mother    Hypertension Father    Hyperlipidemia Father    Glaucoma Father    Kidney disease Father    Colon cancer Maternal Aunt    Social History   Socioeconomic History   Marital status: Married    Spouse name: John   Number of children: 1   Years of education: Not on file   Highest education level: Not on file  Occupational History   Occupation: Retired  Tobacco Use   Smoking status: Never   Smokeless tobacco: Never   Tobacco comments:    experimented in Designer, multimedia   Vaping status: Never Used  Substance and Sexual Activity   Alcohol use: Not Currently    Alcohol/week: 7.0 standard drinks of alcohol    Types: 7 Glasses of wine per week   Drug use: No   Sexual activity: Not Currently    Birth control/protection: Surgical    Comment: HYST-1st intercourse 21 yo-1 partner  Other Topics Concern   Not on file  Social History Narrative   Not on file   Social Determinants of Health   Financial Resource Strain: Low Risk  (05/28/2023)   Overall Financial Resource Strain (CARDIA)    Difficulty of Paying Living Expenses: Not hard at all  Food Insecurity: No Food Insecurity (05/28/2023)   Hunger Vital Sign    Worried About Running Out of Food in the Last Year: Never true    Ran Out of Food in the Last Year: Never true  Transportation Needs: No Transportation Needs (05/28/2023)   PRAPARE - Administrator, Civil Service  (Medical): No    Lack of Transportation (Non-Medical): No  Physical Activity: Sufficiently Active (05/28/2023)   Exercise Vital Sign    Days of Exercise per Week: 7 days    Minutes of Exercise per Session: 60 min  Stress: No Stress Concern Present (05/28/2023)   [employer] of Occupational Health - Occupational Stress Questionnaire    Feeling of Stress : Not at all  Social Connections: Moderately Integrated (05/28/2023)   Social Connection and Isolation Panel [NHANES]    Frequency of Communication with Friends and Family: More than three times a week    Frequency of Social Gatherings with Friends and Family: Once a week    Attends Religious Services: Never  Active Member of Clubs or Organizations: Yes    Attends Engineer, structural: More than 4 times per year    Marital Status: Married    Tobacco Counseling Counseling given: Not Answered Tobacco comments: experimented in college   Clinical Intake:  Pre-visit preparation completed: Yes  Pain : No/denies pain     BMI - recorded: 16.48 Nutritional Status: BMI <19  Underweight Nutritional Risks: None Diabetes: No  How often do you need to have someone help you when you read instructions, pamphlets, or other written materials from your doctor or pharmacy?: 1 - Never  Interpreter Needed?: No  Information entered by :: Selden Noteboom, RMA   Activities of Daily Living    05/28/2023   10:02 AM 06/05/2022   10:02 AM  In your present state of health, do you have any difficulty performing the following activities:  Hearing? 0 0  Vision? 0 0  Difficulty concentrating or making decisions? 0 0  Walking or climbing stairs? 0 0  Dressing or bathing? 0 0  Doing errands, shopping? 0 0  Preparing Food and eating ? N N  Using the Toilet? N N  In the past six months, have you accidently leaked urine? N N  Do you have problems with loss of bowel control? N N  Managing your Medications? N N  Managing your Finances? N  N  Housekeeping or managing your Housekeeping? N N    Patient Care Team: Plotnikov, Georgina Quint, MD as PCP - General (Internal Medicine) Quintella Reichert, MD as PCP - Cardiology (Cardiology) Sharrell Ku, MD (Gastroenterology) Jerilee Field, MD as Attending Physician (Urology) Donzetta Starch, MD as Consulting Physician (Dermatology) Antony Contras, MD as Consulting Physician (Ophthalmology) Antony Contras, MD as Consulting Physician (Ophthalmology) Olivia Mackie, NP as Nurse Practitioner (Gynecology)  Indicate any recent Medical Services you may have received from other than [facility] providers in the past year (date may be approximate).     Assessment:   This is a routine wellness examination for Brittany Mathis.  Hearing/Vision screen Hearing Screening - Comments:: Denies hearing difficulties   Vision Screening - Comments:: Denies any issues  Dietary issues and exercise activities discussed:     Goals Addressed               This Visit's Progress     Patient Stated (pt-stated)   On track     My goal is to stay on top of my physical health.      Depression Screen    05/29/2023    9:59 AM 01/02/2023    1:54 PM 12/06/2022    8:22 AM 06/05/2022    9:58 AM 06/05/2022    9:55 AM 06/03/2021   10:22 AM 11/11/2020    9:38 AM  PHQ 2/9 Scores  PHQ - 2 Score 0 0 0 0 0 0 0  PHQ- 9 Score 0          Fall Risk    05/28/2023   10:02 AM 01/02/2023    1:54 PM 12/06/2022    8:21 AM 06/05/2022    9:58 AM 06/03/2021   10:24 AM  Fall Risk   Falls in the past year? 0 0 0 0 0  Number falls in past yr: 0 0 0 0 0  Injury with Fall? 0 0 0 0 0  Risk for fall due to :  No Fall Risks No Fall Risks  No Fall Risks  Follow up  Falls evaluation completed Falls evaluation completed Falls  evaluation completed;Education provided Falls evaluation completed    MEDICARE RISK AT HOME:   TIMED UP AND GO:  Was the test performed?  No    Cognitive Function:        05/29/2023    9:57 AM 06/05/2022    10:03 AM  6CIT Screen  What Year? 0 points 0 points  What month? 0 points 0 points  What time? 0 points 0 points  Count back from 20 0 points 0 points  Months in reverse 0 points 0 points  Repeat phrase 0 points 0 points  Total Score 0 points 0 points    Immunizations Immunization History  Administered Date(s) Administered   Fluad Quad(high Dose 65+) 07/14/2019, 09/16/2020, 08/24/2021, 08/15/2022   Influenza, High Dose Seasonal PF 08/29/2018   PFIZER(Purple Top)SARS-COV-2 Vaccination 10/25/2019, 11/15/2019, 08/17/2020, 05/24/2021   Pneumococcal Conjugate-13 09/24/2013   Pneumococcal Polysaccharide-23 02/05/2012   Td 02/05/2012   Zoster, Live 02/13/2013    TDAP status: Due, Education has been provided regarding the importance of this vaccine. Advised may receive this vaccine at local pharmacy or Health Dept. Aware to provide a copy of the vaccination record if obtained from local pharmacy or Health Dept. Verbalized acceptance and understanding.  Flu Vaccine status: Up to date  Pneumococcal vaccine status: Up to date  Covid-19 vaccine status: Completed vaccines  Qualifies for Shingles Vaccine? Yes   Zostavax completed Yes   Shingrix Completed?: No.    Education has been provided regarding the importance of this vaccine. Patient has been advised to call insurance company to determine out of pocket expense if they have not yet received this vaccine. Advised may also receive vaccine at local pharmacy or Health Dept. Verbalized acceptance and understanding.  Screening Tests Health Maintenance  Topic Date Due   DTaP/Tdap/Td (2 - Tdap) 02/04/2022   INFLUENZA VACCINE  05/17/2023   COVID-19 Vaccine (5 - 2023-24 season) 06/15/2023 (Originally 06/16/2022)   MAMMOGRAM  05/08/2024   Medicare Annual Wellness (AWV)  05/28/2024   Pneumonia Vaccine 12+ Years old  Completed   DEXA SCAN  Completed   Hepatitis C Screening  Completed   HPV VACCINES  Aged Out   Colonoscopy  Discontinued    Zoster Vaccines- Shingrix  Discontinued    Health Maintenance  Health Maintenance Due  Topic Date Due   DTaP/Tdap/Td (2 - Tdap) 02/04/2022   INFLUENZA VACCINE  05/17/2023    Colorectal cancer screening: No longer required.   Mammogram status: Completed 05/09/2023. Repeat every year  Bone Density status: Completed 06/20/2022. Results reflect: Bone density results: OSTEOPOROSIS. Repeat every 2 years.  Lung Cancer Screening: (Low Dose CT Chest recommended if Age 22-80 years, 20 pack-year currently smoking OR have quit w/in 15years.) does not qualify.   Lung Cancer Screening Referral: N/A  Additional Screening:  Hepatitis C Screening: does qualify; Completed 02/15/2016  Vision Screening: Recommended annual ophthalmology exams for early detection of glaucoma and other disorders of the eye. Is the patient up to date with their annual eye exam?  Yes  Who is the provider or what is the name of the office in which the patient attends annual eye exams? [facility] If pt is not established with a provider, would they like to be referred to a provider to establish care? No .   Dental Screening: Recommended annual dental exams for proper oral hygiene   Community Resource Referral / Chronic Care Management: CRR required this visit?  No   CCM required this visit?  No  Plan:     I have personally reviewed and noted the following in the patient's chart:   Medical and social history Use of alcohol, tobacco or illicit drugs  Current medications and supplements including opioid prescriptions. Patient is not currently taking opioid prescriptions. Functional ability and status Nutritional status Physical activity Advanced directives List of other physicians Hospitalizations, surgeries, and ER visits in previous 12 months Vitals Screenings to include cognitive, depression, and falls Referrals and appointments  In addition, I have reviewed and discussed with patient certain preventive  protocols, quality metrics, and best practice recommendations. A written personalized care plan for preventive services as well as general preventive health recommendations were provided to patient.     Jasun Gasparini L Shakeeta Godette, CMA   05/29/2023   After Visit Summary: (MyChart) Due to this being a telephonic visit, the after visit summary with patients personalized plan was offered to patient via MyChart   Nurse Notes: Patient is due for her Tdap since 2023.  Patient has no concerns today.  She will schedule for next year's Medicare AWV during her next visit with Dr. Posey Rea.   Medical screening examination/treatment/procedure(s) were performed by non-physician practitioner and as supervising physician I was immediately available for consultation/collaboration.  I agree with above. Jacinta Shoe, MD

## 2023-05-29 NOTE — Patient Instructions (Signed)
Brittany Mathis , Thank you for taking time to come for your Medicare Wellness Visit. I appreciate your ongoing commitment to your health goals. Please review the following plan we discussed and let me know if I can assist you in the future.   Referrals/Orders/Follow-Ups/Clinician Recommendations: Remember you are due for your Tetanus vaccine as you can get this done at your local pharmacy.  Keep up the good work.   This is a list of the screening recommended for you and due dates:  Health Maintenance  Topic Date Due   DTaP/Tdap/Td vaccine (2 - Tdap) 02/04/2022   Flu Shot  05/17/2023   COVID-19 Vaccine (5 - 2023-24 season) 06/15/2023*   Mammogram  05/08/2024   Medicare Annual Wellness Visit  05/28/2024   Pneumonia Vaccine  Completed   DEXA scan (bone density measurement)  Completed   Hepatitis C Screening  Completed   HPV Vaccine  Aged Out   Colon Cancer Screening  Discontinued   Zoster (Shingles) Vaccine  Discontinued  *Topic was postponed. The date shown is not the original due date.    Advanced directives: (Copy Requested) Please bring a copy of your health care power of attorney and living will to the office to be added to your chart at your convenience.  Next Medicare Annual Wellness Visit scheduled for next year: No  Preventive Care 34 Years and Older, Female Preventive care refers to lifestyle choices and visits with your health care provider that can promote health and wellness. What does preventive care include? A yearly physical exam. This is also called an annual well check. Dental exams once or twice a year. Routine eye exams. Ask your health care provider how often you should have your eyes checked. Personal lifestyle choices, including: Daily care of your teeth and gums. Regular physical activity. Eating a healthy diet. Avoiding tobacco and drug use. Limiting alcohol use. Practicing safe sex. Taking low-dose aspirin every day. Taking vitamin and mineral supplements as  recommended by your health care provider. What happens during an annual well check? The services and screenings done by your health care provider during your annual well check will depend on your age, overall health, lifestyle risk factors, and family history of disease. Counseling  Your health care provider may ask you questions about your: Alcohol use. Tobacco use. Drug use. Emotional well-being. Home and relationship well-being. Sexual activity. Eating habits. History of falls. Memory and ability to understand (cognition). Work and work Astronomer. Reproductive health. Screening  You may have the following tests or measurements: Height, weight, and BMI. Blood pressure. Lipid and cholesterol levels. These may be checked every 5 years, or more frequently if you are over 72 years old. Skin check. Lung cancer screening. You may have this screening every year starting at age 65 if you have a 30-pack-year history of smoking and currently smoke or have quit within the past 15 years. Fecal occult blood test (FOBT) of the stool. You may have this test every year starting at age 28. Flexible sigmoidoscopy or colonoscopy. You may have a sigmoidoscopy every 5 years or a colonoscopy every 10 years starting at age 65. Hepatitis C blood test. Hepatitis B blood test. Sexually transmitted disease (STD) testing. Diabetes screening. This is done by checking your blood sugar (glucose) after you have not eaten for a while (fasting). You may have this done every 1-3 years. Bone density scan. This is done to screen for osteoporosis. You may have this done starting at age 48. Mammogram. This may be  done every 1-2 years. Talk to your health care provider about how often you should have regular mammograms. Talk with your health care provider about your test results, treatment options, and if necessary, the need for more tests. Vaccines  Your health care provider may recommend certain vaccines, such  as: Influenza vaccine. This is recommended every year. Tetanus, diphtheria, and acellular pertussis (Tdap, Td) vaccine. You may need a Td booster every 10 years. Zoster vaccine. You may need this after age 65. Pneumococcal 13-valent conjugate (PCV13) vaccine. One dose is recommended after age 6. Pneumococcal polysaccharide (PPSV23) vaccine. One dose is recommended after age 57. Talk to your health care provider about which screenings and vaccines you need and how often you need them. This information is not intended to replace advice given to you by your health care provider. Make sure you discuss any questions you have with your health care provider. Document Released: 10/29/2015 Document Revised: 06/21/2016 Document Reviewed: 08/03/2015 Elsevier Interactive Patient Education  2017 [employer].  Fall Prevention in the Home Falls can cause injuries. They can happen to people of all ages. There are many things you can do to make your home safe and to help prevent falls. What can I do on the outside of my home? Regularly fix the edges of walkways and driveways and fix any cracks. Remove anything that might make you trip as you walk through a door, such as a raised step or threshold. Trim any bushes or trees on the path to your home. Use bright outdoor lighting. Clear any walking paths of anything that might make someone trip, such as rocks or tools. Regularly check to see if handrails are loose or broken. Make sure that both sides of any steps have handrails. Any raised decks and porches should have guardrails on the edges. Have any leaves, snow, or ice cleared regularly. Use sand or salt on walking paths during winter. Clean up any spills in your garage right away. This includes oil or grease spills. What can I do in the bathroom? Use night lights. Install grab bars by the toilet and in the tub and shower. Do not use towel bars as grab bars. Use non-skid mats or decals in the tub or  shower. If you need to sit down in the shower, use a plastic, non-slip stool. Keep the floor dry. Clean up any water that spills on the floor as soon as it happens. Remove soap buildup in the tub or shower regularly. Attach bath mats securely with double-sided non-slip rug tape. Do not have throw rugs and other things on the floor that can make you trip. What can I do in the bedroom? Use night lights. Make sure that you have a light by your bed that is easy to reach. Do not use any sheets or blankets that are too big for your bed. They should not hang down onto the floor. Have a firm chair that has side arms. You can use this for support while you get dressed. Do not have throw rugs and other things on the floor that can make you trip. What can I do in the kitchen? Clean up any spills right away. Avoid walking on wet floors. Keep items that you use a lot in easy-to-reach places. If you need to reach something above you, use a strong step stool that has a grab bar. Keep electrical cords out of the way. Do not use floor polish or wax that makes floors slippery. If you must use wax,  use non-skid floor wax. Do not have throw rugs and other things on the floor that can make you trip. What can I do with my stairs? Do not leave any items on the stairs. Make sure that there are handrails on both sides of the stairs and use them. Fix handrails that are broken or loose. Make sure that handrails are as long as the stairways. Check any carpeting to make sure that it is firmly attached to the stairs. Fix any carpet that is loose or worn. Avoid having throw rugs at the top or bottom of the stairs. If you do have throw rugs, attach them to the floor with carpet tape. Make sure that you have a light switch at the top of the stairs and the bottom of the stairs. If you do not have them, ask someone to add them for you. What else can I do to help prevent falls? Wear shoes that: Do not have high heels. Have  rubber bottoms. Are comfortable and fit you well. Are closed at the toe. Do not wear sandals. If you use a stepladder: Make sure that it is fully opened. Do not climb a closed stepladder. Make sure that both sides of the stepladder are locked into place. Ask someone to hold it for you, if possible. Clearly mark and make sure that you can see: Any grab bars or handrails. First and last steps. Where the edge of each step is. Use tools that help you move around (mobility aids) if they are needed. These include: Canes. Walkers. Scooters. Crutches. Turn on the lights when you go into a dark area. Replace any light bulbs as soon as they burn out. Set up your furniture so you have a clear path. Avoid moving your furniture around. If any of your floors are uneven, fix them. If there are any pets around you, be aware of where they are. Review your medicines with your doctor. Some medicines can make you feel dizzy. This can increase your chance of falling. Ask your doctor what other things that you can do to help prevent falls. This information is not intended to replace advice given to you by your health care provider. Make sure you discuss any questions you have with your health care provider. Document Released: 07/29/2009 Document Revised: 03/09/2016 Document Reviewed: 11/06/2014 Elsevier Interactive Patient Education  2017 [employer].

## 2023-06-06 ENCOUNTER — Ambulatory Visit: Payer: Medicare PPO | Admitting: Internal Medicine

## 2023-06-06 ENCOUNTER — Encounter: Payer: Self-pay | Admitting: Internal Medicine

## 2023-06-06 VITALS — BP 118/80 | HR 60 | Temp 98.0°F | Ht 64.0 in | Wt 96.0 lb

## 2023-06-06 DIAGNOSIS — I10 Essential (primary) hypertension: Secondary | ICD-10-CM | POA: Diagnosis not present

## 2023-06-06 DIAGNOSIS — I2510 Atherosclerotic heart disease of native coronary artery without angina pectoris: Secondary | ICD-10-CM | POA: Diagnosis not present

## 2023-06-06 DIAGNOSIS — M26629 Arthralgia of temporomandibular joint, unspecified side: Secondary | ICD-10-CM | POA: Insufficient documentation

## 2023-06-06 DIAGNOSIS — M26622 Arthralgia of left temporomandibular joint: Secondary | ICD-10-CM

## 2023-06-06 DIAGNOSIS — E785 Hyperlipidemia, unspecified: Secondary | ICD-10-CM | POA: Diagnosis not present

## 2023-06-06 DIAGNOSIS — H9202 Otalgia, left ear: Secondary | ICD-10-CM | POA: Diagnosis not present

## 2023-06-06 LAB — COMPREHENSIVE METABOLIC PANEL
ALT: 33 U/L (ref 0–35)
AST: 36 U/L (ref 0–37)
Albumin: 4.7 g/dL (ref 3.5–5.2)
Alkaline Phosphatase: 58 U/L (ref 39–117)
BUN: 12 mg/dL (ref 6–23)
CO2: 29 mEq/L (ref 19–32)
Calcium: 10.2 mg/dL (ref 8.4–10.5)
Chloride: 98 mEq/L (ref 96–112)
Creatinine, Ser: 0.6 mg/dL (ref 0.40–1.20)
GFR: 85.82 mL/min (ref >60.00)
Glucose, Bld: 109 mg/dL — ABNORMAL HIGH (ref 70–99)
Potassium: 5.6 mEq/L — ABNORMAL HIGH (ref 3.5–5.1)
Sodium: 133 mEq/L — ABNORMAL LOW (ref 135–145)
Total Bilirubin: 0.8 mg/dL (ref 0.2–1.2)
Total Protein: 7.8 g/dL (ref 6.0–8.3)

## 2023-06-06 LAB — LIPID PANEL
Cholesterol: 162 mg/dL (ref 0–200)
HDL: 102.2 mg/dL (ref >39.00)
LDL Cholesterol: 51 mg/dL (ref 0–99)
NonHDL: 60.06
Total CHOL/HDL Ratio: 2
Triglycerides: 45 mg/dL (ref 0.0–149.0)
VLDL: 9 mg/dL (ref 0.0–40.0)

## 2023-06-06 NOTE — Assessment & Plan Note (Signed)
New pain in the L ear w/chewing since lunch on 06/05/23 - ?TMJ Aleve bid x 2-3 d pc

## 2023-06-06 NOTE — Assessment & Plan Note (Signed)
Cont on Valsartan, Toprol

## 2023-06-06 NOTE — Assessment & Plan Note (Signed)
Left TMJ - Aleve x 2-3 d

## 2023-06-06 NOTE — Progress Notes (Signed)
Subjective:  Patient ID: Brittany Mathis, female    DOB: 12/20/1945  Age: 77 y.o. MRN: [id number]  CC: Follow-up (6 mnth f/u)   HPI Brittany Mathis presents for CAD, HTN C/o pain in the L ear w/chewing since lunch on 06/05/23 Thin  Outpatient Medications Prior to Visit  Medication Sig Dispense Refill   alendronate (FOSAMAX) 70 MG tablet Take 1 tablet (70 mg total) by mouth every Tuesday. Take with a full glass of water on an empty stomach. 12 tablet 4   aspirin EC 81 MG tablet Take 81 mg by mouth daily with lunch.      Cholecalciferol (VITAMIN D) 50 MCG (2000 UT) tablet Take 2,000 Units by mouth daily with lunch.     Evolocumab (REPATHA SURECLICK) 140 MG/ML SOAJ ADMINISTER 1 ML UNDER THE SKIN EVERY 14 DAYS 6 mL 3   fluticasone (FLONASE) 50 MCG/ACT nasal spray Place 2 sprays into both nostrils daily. 16 g 2   fluticasone-salmeterol (ADVAIR HFA) 115-21 MCG/ACT inhaler Inhale 2 puffs into the lungs as needed (wheezing sob).     metoprolol succinate (TOPROL-XL) 25 MG 24 hr tablet Take 1 tablet (25 mg total) by mouth daily. 90 tablet 3   nitroGLYCERIN (NITROSTAT) 0.4 MG SL tablet Place 1 tablet (0.4 mg total) under the tongue every 5 (five) minutes as needed for chest pain. 25 tablet 3   Propylene Glycol (SYSTANE COMPLETE) 0.6 % SOLN Place 1 drop into both eyes daily.     rosuvastatin (CRESTOR) 40 MG tablet TAKE 1 TABLET(40 MG) BY MOUTH DAILY 90 tablet 3   valsartan (DIOVAN) 160 MG tablet TAKE 1 TABLET(160 MG) BY MOUTH DAILY 90 tablet 0   latanoprost (XALATAN) 0.005 % ophthalmic solution SMARTSIG:In Eye(s) (Patient not taking: Reported on 04/09/2023)     loratadine (CLARITIN) 10 MG tablet Take 1 tablet (10 mg total) by mouth daily. (Patient not taking: Reported on 04/09/2023) 100 tablet 3   Facility-Administered Medications Prior to Visit  Medication Dose Route Frequency Provider Last Rate Last Admin   regadenoson (LEXISCAN) injection SOLN 0.4 mg  0.4 mg Intravenous Once Chilton Si, MD        technetium tetrofosmin (TC-MYOVIEW) injection 29.4 millicurie  29.4 millicurie Intravenous Once PRN Chilton Si, MD        ROS: Review of Systems  Constitutional:  Negative for activity change, appetite change, chills, fatigue and unexpected weight change.  HENT:  Negative for congestion, mouth sores and sinus pressure.   Eyes:  Negative for visual disturbance.  Respiratory:  Negative for cough and chest tightness.   Gastrointestinal:  Negative for abdominal pain and nausea.  Genitourinary:  Negative for difficulty urinating, frequency and vaginal pain.  Musculoskeletal:  Negative for back pain and gait problem.  Skin:  Negative for pallor and rash.  Neurological:  Negative for dizziness, tremors, weakness, numbness and headaches.  Psychiatric/Behavioral:  Negative for confusion and sleep disturbance. The patient is nervous/anxious.     Objective:  BP 118/80 (BP Location: Left Arm, Patient Position: Sitting, Cuff Size: Large)   Pulse 60   Temp 98 F (36.7 C) (Oral)   Ht 5\' 4"  (1.626 m)   Wt 96 lb (43.5 kg)   SpO2 91%   BMI 16.48 kg/m   BP Readings from Last 3 Encounters:  06/06/23 118/80  04/09/23 138/82  01/02/23 122/80    Wt Readings from Last 3 Encounters:  06/06/23 96 lb (43.5 kg)  05/29/23 96 lb (43.5 kg)  04/09/23 96 lb 3.2  oz (43.6 kg)    Physical Exam Constitutional:      General: She is not in acute distress.    Appearance: Normal appearance. She is well-developed.  HENT:     Head: Normocephalic.     Right Ear: External ear normal.     Left Ear: External ear normal.     Nose: Nose normal.  Eyes:     General:        Right eye: No discharge.        Left eye: No discharge.     Conjunctiva/sclera: Conjunctivae normal.     Pupils: Pupils are equal, round, and reactive to light.  Neck:     Thyroid: No thyromegaly.     Vascular: No JVD.     Trachea: No tracheal deviation.  Cardiovascular:     Rate and Rhythm: Normal rate and regular rhythm.      Heart sounds: Normal heart sounds.  Pulmonary:     Effort: No respiratory distress.     Breath sounds: No stridor. No wheezing.  Abdominal:     General: Bowel sounds are normal. There is no distension.     Palpations: Abdomen is soft. There is no mass.     Tenderness: There is no abdominal tenderness. There is no guarding or rebound.  Musculoskeletal:        General: No tenderness.     Cervical back: Normal range of motion and neck supple. No rigidity.  Lymphadenopathy:     Cervical: No cervical adenopathy.  Skin:    Findings: No erythema or rash.  Neurological:     Cranial Nerves: No cranial nerve deficit.     Motor: No abnormal muscle tone.     Coordination: Coordination normal.     Gait: Gait normal.     Deep Tendon Reflexes: Reflexes normal.  Psychiatric:        Behavior: Behavior normal.        Thought Content: Thought content normal.        Judgment: Judgment normal.   B TMJ - NT  Lab Results  Component Value Date   WBC 5.1 12/01/2021   HGB 12.9 12/01/2021   HCT 38.8 12/01/2021   PLT 218.0 12/01/2021   GLUCOSE 101 (H) 12/06/2022   CHOL 149 12/06/2022   TRIG 38.0 12/06/2022   HDL 100.60 12/06/2022   LDLDIRECT 109.2 02/05/2012   LDLCALC 41 12/06/2022   ALT 34 12/06/2022   AST 35 12/06/2022   NA 138 12/06/2022   K 4.6 12/06/2022   CL 101 12/06/2022   CREATININE 0.63 12/06/2022   BUN 17 12/06/2022   CO2 26 12/06/2022   TSH 2.70 12/06/2022    VAS US CAROTID  Result Date: 11/19/2019 Carotid Arterial Duplex Study Indications:  Bilateral bruits. Patient denies any cerebrovascular symptoms, but               says she does have a strong family history of cardiac diseaes and               an elevated coronary calcium score. Risk Factors: Hypertension, hyperlipidemia, no history of smoking. Performing Technologist: Olegario Shearer RVT  Examination Guidelines: A complete evaluation includes B-mode imaging, spectral Doppler, color Doppler, and power Doppler as needed of  all accessible portions of each vessel. Bilateral testing is considered an integral part of a complete examination. Limited examinations for reoccurring indications may be performed as noted.  Right Carotid Findings: +----------+--------+--------+--------+------------------+--------+  PSV cm/sEDV cm/sStenosisPlaque DescriptionComments +----------+--------+--------+--------+------------------+--------+ CCA Prox  74      15                                         +----------+--------+--------+--------+------------------+--------+ CCA Distal61      13                                         +----------+--------+--------+--------+------------------+--------+ ICA Prox  54      14      1-39%   heterogenous               +----------+--------+--------+--------+------------------+--------+ ICA Mid   52      18                                         +----------+--------+--------+--------+------------------+--------+ ICA Distal108     30                                         +----------+--------+--------+--------+------------------+--------+ ECA       48      7                                          +----------+--------+--------+--------+------------------+--------+ +----------+--------+-------+----------------+-------------------+           PSV cm/sEDV cmsDescribe        Arm Pressure (mmHG) +----------+--------+-------+----------------+-------------------+ [id number]            Multiphasic, [id number]                 +----------+--------+-------+----------------+-------------------+ +---------+--------+--+--------+--+---------+ VertebralPSV cm/s70EDV cm/s17Antegrade +---------+--------+--+--------+--+---------+  Left Carotid Findings: +----------+--------+--------+--------+------------------+--------+           PSV cm/sEDV cm/sStenosisPlaque DescriptionComments +----------+--------+--------+--------+------------------+--------+ CCA Prox   60      14                                         +----------+--------+--------+--------+------------------+--------+ CCA Distal54      16                                         +----------+--------+--------+--------+------------------+--------+ ICA Prox  60      18              heterogenous               +----------+--------+--------+--------+------------------+--------+ ICA Mid   87      26      1-39%                              +----------+--------+--------+--------+------------------+--------+ ICA Distal89      34                                         +----------+--------+--------+--------+------------------+--------+  ECA       63      5                                          +----------+--------+--------+--------+------------------+--------+ +----------+--------+--------+----------------+-------------------+           PSV cm/sEDV cm/sDescribe        Arm Pressure (mmHG) +----------+--------+--------+----------------+-------------------+ [id number]             Multiphasic, [id number]                 +----------+--------+--------+----------------+-------------------+ +---------+--------+--+--------+-+---------+ VertebralPSV cm/s23EDV cm/s7Antegrade +---------+--------+--+--------+-+---------+   Summary: Right Carotid: Velocities in the right ICA are consistent with a 1-39% stenosis. Left Carotid: Velocities in the left ICA are consistent with a 1-39% stenosis. Vertebrals:  Bilateral vertebral arteries demonstrate antegrade flow. Subclavians: Normal flow hemodynamics were seen in bilateral subclavian              arteries. *See table(s) above for measurements and observations.  Electronically signed by Lance Muss MD on 11/19/2019 at 11:50:52 AM.    Final     Assessment & Plan:   Problem List Items Addressed This Visit     Essential hypertension - Primary    Cont on Valsartan, Toprol      Relevant Orders   Comprehensive metabolic  panel   Lipid panel   CAD (coronary artery disease)    Continue on Crestor+Repatha      Relevant Orders   Comprehensive metabolic panel   Lipid panel   Ear pain, left    New pain in the L ear w/chewing since lunch on 06/05/23 - ?TMJ Aleve bid x 2-3 d pc        TMJ arthralgia    Left TMJ - Aleve x 2-3 d      Other Visit Diagnoses     Dyslipidemia       Relevant Orders   Comprehensive metabolic panel   Lipid panel         No orders of the defined types were placed in this encounter.     Follow-up: Return in about 6 months (around 12/07/2023) for a follow-up visit.  Sonda Primes, MD

## 2023-06-06 NOTE — Assessment & Plan Note (Addendum)
Continue on Crestor+Repatha

## 2023-06-27 ENCOUNTER — Other Ambulatory Visit: Payer: Self-pay | Admitting: Cardiology

## 2023-08-14 ENCOUNTER — Other Ambulatory Visit: Payer: Self-pay

## 2023-08-14 DIAGNOSIS — M810 Age-related osteoporosis without current pathological fracture: Secondary | ICD-10-CM

## 2023-08-14 NOTE — Telephone Encounter (Signed)
Med refill request: Fosamax Last AEX: 05/17/2022-TW Next AEX: nothing scheduled, recall placed for 2025 (LR MCR) Last MMG (if hormonal med): n/a DEXA: 06/20/2022-osteopenia (T-score -2.1) Refill authorized: rx pend.

## 2023-08-15 MED ORDER — ALENDRONATE SODIUM 70 MG PO TABS: 70.0000 mg | ORAL_TABLET | ORAL | 3 refills | Status: DC

## 2023-08-15 NOTE — Telephone Encounter (Signed)
OK to wait until B&P exam next year as long as she has no concerns with medication. UTD on DXA.

## 2023-08-16 DIAGNOSIS — Z961 Presence of intraocular lens: Secondary | ICD-10-CM | POA: Diagnosis not present

## 2023-08-16 DIAGNOSIS — H401131 Primary open-angle glaucoma, bilateral, mild stage: Secondary | ICD-10-CM | POA: Diagnosis not present

## 2023-09-26 DIAGNOSIS — D1801 Hemangioma of skin and subcutaneous tissue: Secondary | ICD-10-CM | POA: Diagnosis not present

## 2023-09-26 DIAGNOSIS — L821 Other seborrheic keratosis: Secondary | ICD-10-CM | POA: Diagnosis not present

## 2023-09-26 DIAGNOSIS — D485 Neoplasm of uncertain behavior of skin: Secondary | ICD-10-CM | POA: Diagnosis not present

## 2023-09-26 DIAGNOSIS — L812 Freckles: Secondary | ICD-10-CM | POA: Diagnosis not present

## 2023-09-26 DIAGNOSIS — Z85828 Personal history of other malignant neoplasm of skin: Secondary | ICD-10-CM | POA: Diagnosis not present

## 2023-09-26 DIAGNOSIS — C4442 Squamous cell carcinoma of skin of scalp and neck: Secondary | ICD-10-CM | POA: Diagnosis not present

## 2023-11-15 DIAGNOSIS — C4442 Squamous cell carcinoma of skin of scalp and neck: Secondary | ICD-10-CM | POA: Diagnosis not present

## 2023-11-15 DIAGNOSIS — Z85828 Personal history of other malignant neoplasm of skin: Secondary | ICD-10-CM | POA: Diagnosis not present

## 2023-12-11 ENCOUNTER — Encounter: Payer: Self-pay | Admitting: Internal Medicine

## 2023-12-11 ENCOUNTER — Ambulatory Visit: Payer: Medicare PPO | Admitting: Internal Medicine

## 2023-12-11 VITALS — BP 120/80 | HR 57 | Temp 98.3°F | Ht 64.0 in | Wt 92.0 lb

## 2023-12-11 DIAGNOSIS — I10 Essential (primary) hypertension: Secondary | ICD-10-CM | POA: Diagnosis not present

## 2023-12-11 DIAGNOSIS — I2510 Atherosclerotic heart disease of native coronary artery without angina pectoris: Secondary | ICD-10-CM

## 2023-12-11 DIAGNOSIS — M818 Other osteoporosis without current pathological fracture: Secondary | ICD-10-CM | POA: Diagnosis not present

## 2023-12-11 DIAGNOSIS — E782 Mixed hyperlipidemia: Secondary | ICD-10-CM

## 2023-12-11 DIAGNOSIS — R634 Abnormal weight loss: Secondary | ICD-10-CM | POA: Diagnosis not present

## 2023-12-11 LAB — COMPREHENSIVE METABOLIC PANEL
ALT: 40 U/L — ABNORMAL HIGH (ref 0–35)
AST: 41 U/L — ABNORMAL HIGH (ref 0–37)
Albumin: 4.7 g/dL (ref 3.5–5.2)
Alkaline Phosphatase: 52 U/L (ref 39–117)
BUN: 11 mg/dL (ref 6–23)
CO2: 28 meq/L (ref 19–32)
Calcium: 9.9 mg/dL (ref 8.4–10.5)
Chloride: 98 meq/L (ref 96–112)
Creatinine, Ser: 0.58 mg/dL (ref 0.40–1.20)
GFR: 86.21 mL/min (ref >60.00)
Glucose, Bld: 106 mg/dL — ABNORMAL HIGH (ref 70–99)
Potassium: 4.9 meq/L (ref 3.5–5.1)
Sodium: 135 meq/L (ref 135–145)
Total Bilirubin: 0.7 mg/dL (ref 0.2–1.2)
Total Protein: 7.8 g/dL (ref 6.0–8.3)

## 2023-12-11 LAB — LIPID PANEL
Cholesterol: 139 mg/dL (ref 0–200)
HDL: 98.6 mg/dL (ref >39.00)
LDL Cholesterol: 32 mg/dL (ref 0–99)
NonHDL: 40.6
Total CHOL/HDL Ratio: 1
Triglycerides: 41 mg/dL (ref 0.0–149.0)
VLDL: 8.2 mg/dL (ref 0.0–40.0)

## 2023-12-11 LAB — CBC WITH DIFFERENTIAL/PLATELET
Basophils Absolute: 0.1 10*3/uL (ref 0.0–0.1)
Basophils Relative: 1.1 % (ref 0.0–3.0)
Eosinophils Absolute: 0.2 10*3/uL (ref 0.0–0.7)
Eosinophils Relative: 3.9 % (ref 0.0–5.0)
HCT: 42.6 % (ref 36.0–46.0)
Hemoglobin: 14.2 g/dL (ref 12.0–15.0)
Lymphocytes Relative: 33.9 % (ref 12.0–46.0)
Lymphs Abs: 1.6 10*3/uL (ref 0.7–4.0)
MCHC: 33.4 g/dL (ref 30.0–36.0)
MCV: 90.6 fl (ref 78.0–100.0)
Monocytes Absolute: 0.6 10*3/uL (ref 0.1–1.0)
Monocytes Relative: 12.1 % — ABNORMAL HIGH (ref 3.0–12.0)
Neutro Abs: 2.3 10*3/uL (ref 1.4–7.7)
Neutrophils Relative %: 49 % (ref 43.0–77.0)
Platelets: 217 10*3/uL (ref 150.0–400.0)
RBC: 4.71 Mil/uL (ref 3.87–5.11)
RDW: 12.6 % (ref 11.5–15.5)
WBC: 4.7 10*3/uL (ref 4.0–10.5)

## 2023-12-11 LAB — URINALYSIS, ROUTINE W REFLEX MICROSCOPIC
Bilirubin Urine: NEGATIVE
Ketones, ur: NEGATIVE
Nitrite: NEGATIVE
Specific Gravity, Urine: 1.015 (ref 1.000–1.030)
Total Protein, Urine: NEGATIVE
Urine Glucose: NEGATIVE
Urobilinogen, UA: 0.2 (ref 0.0–1.0)
pH: 6 (ref 5.0–8.0)

## 2023-12-11 LAB — TSH: TSH: 2.73 u[IU]/mL (ref 0.35–5.50)

## 2023-12-11 NOTE — Assessment & Plan Note (Signed)
 On Crestor, Repatha

## 2023-12-11 NOTE — Assessment & Plan Note (Addendum)
 Vit D, yard work, posture, yoga Ok to d/c Fosamax. [place]ina will check w/T Earlene Plater, NP

## 2023-12-11 NOTE — Assessment & Plan Note (Signed)
 Wt Readings from Last 3 Encounters:  12/11/23 92 lb (41.7 kg)  06/06/23 96 lb (43.5 kg)  05/29/23 96 lb (43.5 kg)  On a supplemental diet

## 2023-12-11 NOTE — Progress Notes (Signed)
 Subjective:  Patient ID: Brittany Mathis, female    DOB: 1946/07/16  Age: 78 y.o. MRN: [id number]  CC: Medical Management of Chronic Issues (6 mnth f/u)   HPI Brittany Mathis presents for CAD, dyslipidemia, SCC on the scalp removed    Outpatient Medications Prior to Visit  Medication Sig Dispense Refill   alendronate (FOSAMAX) 70 MG tablet Take 1 tablet (70 mg total) by mouth every Tuesday. Take with a full glass of water on an empty stomach. 12 tablet 3   aspirin EC 81 MG tablet Take 81 mg by mouth daily with lunch.      Cholecalciferol (VITAMIN D) 50 MCG (2000 UT) tablet Take 2,000 Units by mouth daily with lunch.     Evolocumab (REPATHA SURECLICK) 140 MG/ML SOAJ ADMINISTER 1 ML UNDER THE SKIN EVERY 14 DAYS 6 mL 3   fluticasone-salmeterol (ADVAIR HFA) 115-21 MCG/ACT inhaler Inhale 2 puffs into the lungs as needed (wheezing sob).     metoprolol succinate (TOPROL-XL) 25 MG 24 hr tablet Take 1 tablet (25 mg total) by mouth daily. 90 tablet 3   nitroGLYCERIN (NITROSTAT) 0.4 MG SL tablet Place 1 tablet (0.4 mg total) under the tongue every 5 (five) minutes as needed for chest pain. 25 tablet 3   Propylene Glycol (SYSTANE COMPLETE) 0.6 % SOLN Place 1 drop into both eyes daily.     rosuvastatin (CRESTOR) 40 MG tablet TAKE 1 TABLET(40 MG) BY MOUTH DAILY 90 tablet 3   valsartan (DIOVAN) 160 MG tablet TAKE 1 TABLET(160 MG) BY MOUTH DAILY 90 tablet 3   fluticasone (FLONASE) 50 MCG/ACT nasal spray Place 2 sprays into both nostrils daily. (Patient not taking: Reported on 12/11/2023) 16 g 2   Facility-Administered Medications Prior to Visit  Medication Dose Route Frequency Provider Last Rate Last Admin   regadenoson (LEXISCAN) injection SOLN 0.4 mg  0.4 mg Intravenous Once Chilton Si, MD       technetium tetrofosmin (TC-MYOVIEW) injection 29.4 millicurie  29.4 millicurie Intravenous Once PRN Chilton Si, MD        ROS: Review of Systems  Constitutional:  Positive for unexpected weight  change. Negative for activity change, appetite change, chills and fatigue.  HENT:  Negative for congestion, mouth sores and sinus pressure.   Eyes:  Negative for visual disturbance.  Respiratory:  Negative for cough and chest tightness.   Gastrointestinal:  Negative for abdominal pain and nausea.  Genitourinary:  Negative for difficulty urinating, frequency and vaginal pain.  Musculoskeletal:  Negative for back pain and gait problem.  Skin:  Negative for pallor and rash.  Neurological:  Negative for dizziness, tremors, weakness, numbness and headaches.  Psychiatric/Behavioral:  Negative for confusion and sleep disturbance.     Objective:  BP 120/80   Pulse (!) 57   Temp 98.3 F (36.8 C) (Oral)   Ht 5\' 4"  (1.626 m)   Wt 92 lb (41.7 kg)   SpO2 98%   BMI 15.79 kg/m   BP Readings from Last 3 Encounters:  12/11/23 120/80  06/06/23 118/80  04/09/23 138/82    Wt Readings from Last 3 Encounters:  12/11/23 92 lb (41.7 kg)  06/06/23 96 lb (43.5 kg)  05/29/23 96 lb (43.5 kg)    Physical Exam Constitutional:      General: She is not in acute distress.    Appearance: Normal appearance. She is well-developed.  HENT:     Head: Normocephalic.     Right Ear: External ear normal.  Left Ear: External ear normal.     Nose: Nose normal.  Eyes:     General:        Right eye: No discharge.        Left eye: No discharge.     Conjunctiva/sclera: Conjunctivae normal.     Pupils: Pupils are equal, round, and reactive to light.  Neck:     Thyroid: No thyromegaly.     Vascular: No JVD.     Trachea: No tracheal deviation.  Cardiovascular:     Rate and Rhythm: Normal rate and regular rhythm.     Heart sounds: Normal heart sounds.  Pulmonary:     Effort: No respiratory distress.     Breath sounds: No stridor. No wheezing.  Abdominal:     General: Bowel sounds are normal. There is no distension.     Palpations: Abdomen is soft. There is no mass.     Tenderness: There is no abdominal  tenderness. There is no guarding or rebound.  Musculoskeletal:        General: No tenderness.     Cervical back: Normal range of motion and neck supple. No rigidity.  Lymphadenopathy:     Cervical: No cervical adenopathy.  Skin:    Findings: No erythema or rash.  Neurological:     Cranial Nerves: No cranial nerve deficit.     Motor: No abnormal muscle tone.     Coordination: Coordination normal.     Deep Tendon Reflexes: Reflexes normal.  Psychiatric:        Behavior: Behavior normal.        Thought Content: Thought content normal.        Judgment: Judgment normal.   Thin  Lab Results  Component Value Date   WBC 5.1 12/01/2021   HGB 12.9 12/01/2021   HCT 38.8 12/01/2021   PLT 218.0 12/01/2021   GLUCOSE 109 (H) 06/06/2023   CHOL 162 06/06/2023   TRIG 45.0 06/06/2023   HDL 102.20 06/06/2023   LDLDIRECT 109.2 02/05/2012   LDLCALC 51 06/06/2023   ALT 33 06/06/2023   AST 36 06/06/2023   NA 133 (L) 06/06/2023   K 5.6 No hemolysis seen (H) 06/06/2023   CL 98 06/06/2023   CREATININE 0.60 06/06/2023   BUN 12 06/06/2023   CO2 29 06/06/2023   TSH 2.70 12/06/2022    VAS US CAROTID Result Date: 11/19/2019 Carotid Arterial Duplex Study Indications:  Bilateral bruits. Patient denies any cerebrovascular symptoms, but               says she does have a strong family history of cardiac diseaes and               an elevated coronary calcium score. Risk Factors: Hypertension, hyperlipidemia, no history of smoking. Performing Technologist: Olegario Shearer RVT  Examination Guidelines: A complete evaluation includes B-mode imaging, spectral Doppler, color Doppler, and power Doppler as needed of all accessible portions of each vessel. Bilateral testing is considered an integral part of a complete examination. Limited examinations for reoccurring indications may be performed as noted.  Right Carotid Findings: +----------+--------+--------+--------+------------------+--------+           PSV  cm/sEDV cm/sStenosisPlaque DescriptionComments +----------+--------+--------+--------+------------------+--------+ CCA Prox  74      15                                         +----------+--------+--------+--------+------------------+--------+  CCA Distal61      13                                         +----------+--------+--------+--------+------------------+--------+ ICA Prox  54      14      1-39%   heterogenous               +----------+--------+--------+--------+------------------+--------+ ICA Mid   52      18                                         +----------+--------+--------+--------+------------------+--------+ ICA Distal108     30                                         +----------+--------+--------+--------+------------------+--------+ ECA       48      7                                          +----------+--------+--------+--------+------------------+--------+ +----------+--------+-------+----------------+-------------------+           PSV cm/sEDV cmsDescribe        Arm Pressure (mmHG) +----------+--------+-------+----------------+-------------------+ [id number]            Multiphasic, [id number]                 +----------+--------+-------+----------------+-------------------+ +---------+--------+--+--------+--+---------+ VertebralPSV cm/s70EDV cm/s17Antegrade +---------+--------+--+--------+--+---------+  Left Carotid Findings: +----------+--------+--------+--------+------------------+--------+           PSV cm/sEDV cm/sStenosisPlaque DescriptionComments +----------+--------+--------+--------+------------------+--------+ CCA Prox  60      14                                         +----------+--------+--------+--------+------------------+--------+ CCA Distal54      16                                         +----------+--------+--------+--------+------------------+--------+ ICA Prox  60      18               heterogenous               +----------+--------+--------+--------+------------------+--------+ ICA Mid   87      26      1-39%                              +----------+--------+--------+--------+------------------+--------+ ICA Distal89      34                                         +----------+--------+--------+--------+------------------+--------+ ECA       63      5                                          +----------+--------+--------+--------+------------------+--------+ +----------+--------+--------+----------------+-------------------+  PSV cm/sEDV cm/sDescribe        Arm Pressure (mmHG) +----------+--------+--------+----------------+-------------------+ [id number]             Multiphasic, [id number]                 +----------+--------+--------+----------------+-------------------+ +---------+--------+--+--------+-+---------+ VertebralPSV cm/s23EDV cm/s7Antegrade +---------+--------+--+--------+-+---------+   Summary: Right Carotid: Velocities in the right ICA are consistent with a 1-39% stenosis. Left Carotid: Velocities in the left ICA are consistent with a 1-39% stenosis. Vertebrals:  Bilateral vertebral arteries demonstrate antegrade flow. Subclavians: Normal flow hemodynamics were seen in bilateral subclavian              arteries. *See table(s) above for measurements and observations.  Electronically signed by Lance Muss MD on 11/19/2019 at 11:50:52 AM.    Final     Assessment & Plan:   Problem List Items Addressed This Visit     Essential hypertension - Primary   Cont on Valsartan, Toprol      Relevant Orders   TSH   Urinalysis   CBC with Differential/Platelet   Lipid panel   Comprehensive metabolic panel   Hyperlipidemia   On Crestor, Repatha      Relevant Orders   TSH   Lipid panel   Weight loss   Wt Readings from Last 3 Encounters:  12/11/23 92 lb (41.7 kg)  06/06/23 96 lb (43.5 kg)  05/29/23 96 lb (43.5 kg)  On a  supplemental diet       Relevant Orders   TSH   Osteoporosis   Vit D, yard work, posture, yoga Ok to d/c Fosamax. Miracle will check w/T Earlene Plater, NP       CAD (coronary artery disease)   Continue on Crestor+Repatha      Relevant Orders   TSH   Urinalysis   CBC with Differential/Platelet   Lipid panel   Comprehensive metabolic panel      No orders of the defined types were placed in this encounter.     Follow-up: Return in about 6 months (around 06/09/2024) for Wellness Exam.  Sonda Primes, MD

## 2023-12-11 NOTE — Assessment & Plan Note (Signed)
 Continue on Crestor+Repatha

## 2023-12-11 NOTE — Assessment & Plan Note (Signed)
 Cont on Valsartan, Toprol

## 2023-12-12 ENCOUNTER — Encounter: Payer: Self-pay | Admitting: Internal Medicine

## 2024-02-20 DIAGNOSIS — H401131 Primary open-angle glaucoma, bilateral, mild stage: Secondary | ICD-10-CM | POA: Diagnosis not present

## 2024-03-26 ENCOUNTER — Other Ambulatory Visit: Payer: Self-pay | Admitting: Cardiology

## 2024-04-25 ENCOUNTER — Other Ambulatory Visit: Payer: Self-pay | Admitting: Nurse Practitioner

## 2024-04-25 DIAGNOSIS — M810 Age-related osteoporosis without current pathological fracture: Secondary | ICD-10-CM

## 2024-05-10 ENCOUNTER — Other Ambulatory Visit: Payer: Self-pay | Admitting: Cardiology

## 2024-05-10 DIAGNOSIS — E785 Hyperlipidemia, unspecified: Secondary | ICD-10-CM

## 2024-05-10 DIAGNOSIS — I2510 Atherosclerotic heart disease of native coronary artery without angina pectoris: Secondary | ICD-10-CM

## 2024-05-14 DIAGNOSIS — Z1231 Encounter for screening mammogram for malignant neoplasm of breast: Secondary | ICD-10-CM | POA: Diagnosis not present

## 2024-05-14 LAB — HM MAMMOGRAPHY

## 2024-05-23 ENCOUNTER — Ambulatory Visit: Attending: Cardiology | Admitting: Cardiology

## 2024-05-23 ENCOUNTER — Encounter: Payer: Self-pay | Admitting: Cardiology

## 2024-05-23 VITALS — BP 174/82 | HR 54 | Ht 64.0 in | Wt 94.2 lb

## 2024-05-23 DIAGNOSIS — E7800 Pure hypercholesterolemia, unspecified: Secondary | ICD-10-CM | POA: Diagnosis not present

## 2024-05-23 DIAGNOSIS — I2510 Atherosclerotic heart disease of native coronary artery without angina pectoris: Secondary | ICD-10-CM

## 2024-05-23 DIAGNOSIS — I10 Essential (primary) hypertension: Secondary | ICD-10-CM

## 2024-05-23 NOTE — Progress Notes (Addendum)
 Cardiology Office Note:    Date:  05/23/2024   ID:  Brittany Mathis, DOB 17-Mar-1946, MRN [id number]  PCP:  Garald Karlynn GAILS, MD  Cardiologist:  Wilbert Bihari, MD    Referring MD: Garald Karlynn GAILS, MD   Chief Complaint  Patient presents with   Coronary Artery Disease   Hypertension   Hyperlipidemia    History of Present Illness:    Brittany Mathis is a 78 y.o. female with a hx of HTN and HLD who underwent risk factor assessment for CAD with coronary artery calcium  screening and was found to have a calcium  score of 1431. She has  a strong family hx of CAD with her father having an MI in his 2's.  Her mom had a CVA.  She had CRF besides her fm hx including HTN and HLD.   She underwent cardiac cath in 2020 for CP showing 70% RPAV, 25% RCA, 70% pLAD, 90% mLAD, 90% D1 and normal LVF.  Aggressive medical therapy was recommended.  It was recommended at the time of cath to pursue CABG for refractory angina as she was not a candidate for PCI due to complexity of her disease.   She is here today for follow-up and is doing well.  She denies any chest pain or pressure, SOB, DOE, PND, orthopnea, lower extremity edema, dizziness, presyncope or syncope or palpitations.  Past Medical History:  Diagnosis Date   Carotid artery stenosis    1-39% bilateral by dopplers 11/2019   Cataract    History of shingles    Hyperlipidemia    Hypertension    Nephrolithiasis 1972   Osteoporosis 05/2017   T score -2.7   Thyroid  disease 2010   thyroiditis    Past Surgical History:  Procedure Laterality Date   ABDOMINAL HYSTERECTOMY  1983   with BSO  menorrhagia, endometriosis   cataracts Bilateral 2018   LEFT HEART CATH AND CORONARY ANGIOGRAPHY N/A 09/05/2019   Procedure: LEFT HEART CATH AND CORONARY ANGIOGRAPHY;  Surgeon: [place], Peter M, MD;  Location: [facility] INVASIVE CV LAB;  Service: Cardiovascular;  Laterality: N/A;   OOPHORECTOMY     BSO    Current Medications: Current Meds  Medication Sig    alendronate  (FOSAMAX ) 70 MG tablet Take 1 tablet (70 mg total) by mouth every Tuesday. Take with a full glass of water on an empty stomach.   aspirin  EC 81 MG tablet Take 81 mg by mouth daily with lunch.    Cholecalciferol (VITAMIN D ) 50 MCG (2000 UT) tablet Take 2,000 Units by mouth daily with lunch.   metoprolol  succinate (TOPROL -XL) 25 MG 24 hr tablet TAKE 1 TABLET(25 MG) BY MOUTH DAILY   nitroGLYCERIN  (NITROSTAT ) 0.4 MG SL tablet Place 1 tablet (0.4 mg total) under the tongue every 5 (five) minutes as needed for chest pain.   Propylene Glycol (SYSTANE COMPLETE) 0.6 % SOLN Place 1 drop into both eyes daily.   REPATHA  SURECLICK 140 MG/ML SOAJ ADMINISTER 1 ML UNDER THE SKIN EVERY 14 DAYS   rosuvastatin  (CRESTOR ) 40 MG tablet TAKE 1 TABLET(40 MG) BY MOUTH DAILY   valsartan  (DIOVAN ) 160 MG tablet TAKE 1 TABLET(160 MG) BY MOUTH DAILY     Allergies:   Patient has no known allergies.   Social History   Socioeconomic History   Marital status: Married    Spouse name: John   Number of children: 1   Years of education: Not on file   Highest education level: Not on file  Occupational History  Occupation: Retired  Tobacco Use   Smoking status: Never   Smokeless tobacco: Never   Tobacco comments:    experimented in Designer, multimedia   Vaping status: Never Used  Substance and Sexual Activity   Alcohol use: Not Currently    Alcohol/week: 7.0 standard drinks of alcohol    Types: 7 Glasses of wine per week   Drug use: No   Sexual activity: Not Currently    Birth control/protection: Surgical    Comment: HYST-1st intercourse 21 yo-1 partner  Other Topics Concern   Not on file  Social History Narrative   Not on file   Social Drivers of Health   Financial Resource Strain: Low Risk  (05/28/2023)   Overall Financial Resource Strain (CARDIA)    Difficulty of Paying Living Expenses: Not hard at all  Food Insecurity: No Food Insecurity (05/28/2023)   Hunger Vital Sign    Worried About  Running Out of Food in the Last Year: Never true    Ran Out of Food in the Last Year: Never true  Transportation Needs: No Transportation Needs (05/28/2023)   PRAPARE - Administrator, Civil Service (Medical): No    Lack of Transportation (Non-Medical): No  Physical Activity: Sufficiently Active (05/28/2023)   Exercise Vital Sign    Days of Exercise per Week: 7 days    Minutes of Exercise per Session: 60 min  Stress: No Stress Concern Present (05/28/2023)   [employer] of Occupational Health - Occupational Stress Questionnaire    Feeling of Stress : Not at all  Social Connections: Unknown (05/28/2023)   Social Connection and Isolation Panel    Frequency of Communication with Friends and Family: More than three times a week    Frequency of Social Gatherings with Friends and Family: Once a week    Attends Religious Services: Not on Marketing executive or Organizations: Yes    Attends Engineer, structural: More than 4 times per year    Marital Status: Married     Family History: The patient's family history includes Breast cancer in her mother; Colon cancer in her maternal aunt; Glaucoma in her father; Hyperlipidemia in her father and mother; Hypertension in her father and mother; Kidney disease in her father; Stroke in her mother.  ROS:   Please see the history of present illness.    ROS  All other systems reviewed and negative.   EKGs/Labs/Other Studies Reviewed:    The following studies were reviewed today: Cardiac cath, labs from PCP  EKG Interpretation Date/Time:  Friday May 23 2024 09:37:25 EDT Ventricular Rate:  54 PR Interval:  196 QRS Duration:  90 QT Interval:  484 QTC Calculation: 458 R Axis:   -53  Text Interpretation: Sinus bradycardia Possible Left atrial enlargement RSR' or QR pattern in V1 suggests right ventricular conduction delay Left axis deviation Inferior infarct (cited on or before 09-Apr-2023) Anterior infarct  (cited on or before 09-Apr-2023) When compared with ECG of 09-Apr-2023 11:33, Questionable change in initial forces of Inferior leads Confirmed by Shlomo Corning ([id number]) on 05/23/2024 9:39:11 AM   Recent Labs: 12/11/2023: ALT 40; BUN 11; Creatinine, Ser 0.58; Hemoglobin 14.2; Platelets 217.0; Potassium 4.9; Sodium 135; TSH 2.73   Recent Lipid Panel    Component Value Date/Time   CHOL 139 12/11/2023 0850   CHOL 134 04/20/2021 0819   TRIG 41.0 12/11/2023 0850   HDL 98.60 12/11/2023 0850   HDL 87 04/20/2021 0819  CHOLHDL 1 12/11/2023 0850   VLDL 8.2 12/11/2023 0850   LDLCALC 32 12/11/2023 0850   LDLCALC 36 04/20/2021 0819   LDLDIRECT 109.2 02/05/2012 0905    Physical Exam:    VS:  BP (!) 174/82   Pulse (!) 54   Ht 5' 4 (1.626 m)   Wt 94 lb 3.2 oz (42.7 kg)   SpO2 99%   BMI 16.17 kg/m     Wt Readings from Last 3 Encounters:  05/23/24 94 lb 3.2 oz (42.7 kg)  12/11/23 92 lb (41.7 kg)  06/06/23 96 lb (43.5 kg)    GEN: Well nourished, well developed in no acute distress HEENT: Normal NECK: No JVD; No carotid bruits LYMPHATICS: No lymphadenopathy CARDIAC:RRR, no murmurs, rubs, gallops RESPIRATORY:  Clear to auscultation without rales, wheezing or rhonchi  ABDOMEN: Soft, non-tender, non-distended MUSCULOSKELETAL:  No edema; No deformity  SKIN: Warm and dry NEUROLOGIC:  Alert and oriented x 3 PSYCHIATRIC:  Normal affect  ASSESSMENT:    1. Coronary artery disease involving native coronary artery of native heart without angina pectoris   2. Essential hypertension   3. Pure hypercholesterolemia    PLAN:    In order of problems listed above:  1.  ASCAD -cath showing 70% RPAV, 25% RCA, 70% pLAD, 90% mLAD, 90% D1 -It was recommended at the time of cath to pursue CABG for refractory angina as she was not a candidate for PCI due to complexity of her disease.  -She has not had any anginal symptoms since I saw her last - Continue aspirin  81 mg daily, Toprol -XL 25 mg daily,  Crestor  40 mg daily with as needed refills  2.  HTN - BP is elevated on exam today but was very nervous about getting to the new office.  Repeat reading was 160/27mmHg -Continue Toprol -XL 25 mg daily and valsartan  160 mg daily with as needed refills -I have personally reviewed and interpreted outside labs performed by patient's PCP which showed serum creatinine 0.58 and potassium 4.9 on 12/11/2023 -I have asked her to check her BP twice daily for a week and call with results  3.  HLD -LDL goal < 70 -I have personally reviewed and interpreted outside labs performed by patient's PCP which showed LDL 32, HDL 98, triglycerides 41, ALT 40 on 12/11/2023 - Continue Crestor  40 mg daily and Repatha  with as needed refills   Followup with me in 1 year  Medication Adjustments/Labs and Tests Ordered: Current medicines are reviewed at length with the patient today.  Concerns regarding medicines are outlined above.  Orders Placed This Encounter  Procedures   EKG 12-Lead   No orders of the defined types were placed in this encounter.   Signed, Wilbert Bihari, MD  05/23/2024 9:44 AM    [facility]

## 2024-05-23 NOTE — Patient Instructions (Signed)
 Medication Instructions:  Your physician recommends that you continue on your current medications as directed. Please refer to the Current Medication list given to you today.  *If you need a refill on your cardiac medications before your next appointment, please call your pharmacy*  Lab Work: None.  If you have labs (blood work) drawn today and your tests are completely normal, you will receive your results only by: MyChart Message (if you have MyChart) OR A paper copy in the mail If you have any lab test that is abnormal or we need to change your treatment, we will call you to review the results.  Testing/Procedures: None.  Follow-Up: At [facility], you and your health needs are our priority.  As part of our continuing mission to provide you with exceptional heart care, our providers are all part of one team.  This team includes your primary Cardiologist (physician) and Advanced Practice Providers or APPs (Physician Assistants and Nurse Practitioners) who all work together to provide you with the care you need, when you need it.  Your next appointment:   1 year(s)  Provider:   Wilbert Bihari, MD     Other Instructions Please track your blood pressure twice a day for one week. Check your blood pressure at lunch and again at dinner. Write down each reading and the date/time. If your monitor gives you a heart rate, please write that down too. At the end of the week you can send us  a picture of your readings over MyChart, call our office and give the readings over the phone, or drop off the readings at the front desk.

## 2024-05-28 ENCOUNTER — Encounter: Payer: Self-pay | Admitting: Nurse Practitioner

## 2024-05-28 ENCOUNTER — Ambulatory Visit: Admitting: Nurse Practitioner

## 2024-05-28 VITALS — BP 118/78 | HR 58 | Ht 63.0 in | Wt 92.0 lb

## 2024-05-28 DIAGNOSIS — Z780 Asymptomatic menopausal state: Secondary | ICD-10-CM | POA: Diagnosis not present

## 2024-05-28 DIAGNOSIS — M810 Age-related osteoporosis without current pathological fracture: Secondary | ICD-10-CM | POA: Diagnosis not present

## 2024-05-28 DIAGNOSIS — Z01419 Encounter for gynecological examination (general) (routine) without abnormal findings: Secondary | ICD-10-CM | POA: Diagnosis not present

## 2024-05-28 MED ORDER — ALENDRONATE SODIUM 70 MG PO TABS: 70.0000 mg | ORAL_TABLET | ORAL | 1 refills | Status: AC

## 2024-05-28 NOTE — Progress Notes (Signed)
 Brittany Mathis 20-Jan-1946 [id number]   History:  78 y.o. [name] presents for breast and pelvic exam. Postmenopausal - no HRT. Was on estradiol  patch but weaned after CAD diagnosis. S/P 1983 TAH BSO for endometriosis and menorrhagia. Fosamax  x 6 years for osteoporosis. HLD, HTN, CAD managed by cardiology.   Gynecologic History No LMP recorded. Patient has had a hysterectomy.   Contraception: status post hysterectomy Sexually active: No, d/t husband's health  Health Maintenance Last Pap: No longer screening per guidelines Last mammogram: 05/14/2024. Results were: Normal Last colonoscopy: 08/10/2020, 5-year recall Last Dexa: 06/20/2022. Results were: T-score -2.1  Past medical history, past surgical history, family history and social history were all reviewed and documented in the EPIC chart. Married. Daughter works for [employer] in [place], has 68 yo son.   ROS:  A ROS was performed and pertinent positives and negatives are included.  Exam:  Vitals:   05/28/24 1156  BP: 118/78  Pulse: (!) 58  SpO2: 98%  Weight: 92 lb (41.7 kg)  Height: 5' 3 (1.6 m)     Body mass index is 16.3 kg/m.  General appearance:  Normal Thyroid :  Symmetrical, normal in size, without palpable masses or nodularity. Respiratory  Auscultation:  Clear without wheezing or rhonchi Cardiovascular  Auscultation:  Regular rate, without rubs, murmurs or gallops  Edema/varicosities:  Not grossly evident Abdominal  Soft,nontender, without masses, guarding or rebound.  Liver/spleen:  No organomegaly noted  Hernia:  None appreciated  Skin  Inspection:  Grossly normal Breasts: Examined lying and sitting. Bilateral fibrocystic tissue.  Right: Without masses, retractions, nipple discharge or axillary adenopathy.   Left: Without masses, retractions, nipple discharge or axillary adenopathy. Pelvic: External genitalia:  no lesions              Urethra:  normal appearing urethra with no masses, tenderness or  lesions              Bartholins and Skenes: normal                 Vagina: normal appearing vagina with normal color and discharge, no lesions. Atrophic changes              Cervix: absent Bimanual Exam:  Uterus:  absent              Adnexa: no mass, fullness, tenderness              Rectovaginal: Deferred              Anus:  normal, no lesions  Brittany Mathis, CMA present as chaperone.   Assessment/Plan:  78 y.o. [name] for breast and pelvic exam.   Encounter for breast and pelvic examination - Education provided on SBEs, importance of preventative screenings, current guidelines, high calcium  diet, regular exercise, and multivitamin daily. Labs with PCP.   Postmenopausal - no HRT.   Postmenopausal osteoporosis - Plan: alendronate  (FOSAMAX ) 70 MG tablet weekly. Started Fosamax  6 years ago. 05/2022 T-score -2.1. DXA scheduled in February. Will send order to [facility] to see if she can get in sooner there. Continue vitamin D  supplement and regular exercise.  Screening for cervical cancer - Normal Pap history. No longer screening per guidelines.   Screening for breast cancer - Normal mammogram history.  Continue annual screenings.  Normal breast exam today.  Screening for colon cancer - 2021 colonoscopy. Will repeat at GI's recommended interval.   Return in about 2 years (around 05/28/2026) for B&P.   Fenix Ruppe  DELENA Shutter DNP, 12:16 PM 05/28/2024

## 2024-06-02 ENCOUNTER — Telehealth: Payer: Self-pay | Admitting: Cardiology

## 2024-06-02 NOTE — Telephone Encounter (Signed)
 Pt dropped off blood pressure recordings for Dr. Shlomo to review.   Location: Dr. Shlomo mailbox

## 2024-06-09 ENCOUNTER — Telehealth: Payer: Self-pay | Admitting: *Deleted

## 2024-06-09 NOTE — Telephone Encounter (Signed)
 Signed.

## 2024-06-09 NOTE — Telephone Encounter (Signed)
 Patient notified. Patient will call to cancel at [facility] once scheduled at [facility].   Encounter closed.

## 2024-06-09 NOTE — Telephone Encounter (Signed)
 Patient left message on triage line. Order previously placed for BMD at [facility]. Patient is requesting order for [facility].   Patient has active BMD order placed 04/25/2024  Order placed for BMD at [facility] via provider portal. Pended. Routing to Tiffany to review.

## 2024-06-10 ENCOUNTER — Encounter: Payer: Self-pay | Admitting: Internal Medicine

## 2024-06-10 ENCOUNTER — Ambulatory Visit: Payer: Medicare PPO | Admitting: Internal Medicine

## 2024-06-10 VITALS — BP 130/80 | HR 35 | Temp 97.6°F | Ht 63.0 in | Wt 91.0 lb

## 2024-06-10 DIAGNOSIS — Z0000 Encounter for general adult medical examination without abnormal findings: Secondary | ICD-10-CM | POA: Diagnosis not present

## 2024-06-10 DIAGNOSIS — I2510 Atherosclerotic heart disease of native coronary artery without angina pectoris: Secondary | ICD-10-CM | POA: Diagnosis not present

## 2024-06-10 DIAGNOSIS — E782 Mixed hyperlipidemia: Secondary | ICD-10-CM

## 2024-06-10 LAB — CBC WITH DIFFERENTIAL/PLATELET
Basophils Absolute: 0.1 K/uL (ref 0.0–0.1)
Basophils Relative: 1.3 % (ref 0.0–3.0)
Eosinophils Absolute: 0.4 K/uL (ref 0.0–0.7)
Eosinophils Relative: 8.6 % — ABNORMAL HIGH (ref 0.0–5.0)
HCT: 41.7 % (ref 36.0–46.0)
Hemoglobin: 14.1 g/dL (ref 12.0–15.0)
Lymphocytes Relative: 24.5 % (ref 12.0–46.0)
Lymphs Abs: 1.1 K/uL (ref 0.7–4.0)
MCHC: 33.8 g/dL (ref 30.0–36.0)
MCV: 89.4 fl (ref 78.0–100.0)
Monocytes Absolute: 0.6 K/uL (ref 0.1–1.0)
Monocytes Relative: 13.3 % — ABNORMAL HIGH (ref 3.0–12.0)
Neutro Abs: 2.3 K/uL (ref 1.4–7.7)
Neutrophils Relative %: 52.3 % (ref 43.0–77.0)
Platelets: 250 K/uL (ref 150.0–400.0)
RBC: 4.66 Mil/uL (ref 3.87–5.11)
RDW: 12.7 % (ref 11.5–15.5)
WBC: 4.3 K/uL (ref 4.0–10.5)

## 2024-06-10 LAB — COMPREHENSIVE METABOLIC PANEL WITH GFR
ALT: 23 U/L (ref 0–35)
AST: 30 U/L (ref 0–37)
Albumin: 4.5 g/dL (ref 3.5–5.2)
Alkaline Phosphatase: 59 U/L (ref 39–117)
BUN: 11 mg/dL (ref 6–23)
CO2: 28 meq/L (ref 19–32)
Calcium: 9.8 mg/dL (ref 8.4–10.5)
Chloride: 92 meq/L — ABNORMAL LOW (ref 96–112)
Creatinine, Ser: 0.57 mg/dL (ref 0.40–1.20)
GFR: 86.27 mL/min
Glucose, Bld: 100 mg/dL — ABNORMAL HIGH (ref 70–99)
Potassium: 4.7 meq/L (ref 3.5–5.1)
Sodium: 131 meq/L — ABNORMAL LOW (ref 135–145)
Total Bilirubin: 0.7 mg/dL (ref 0.2–1.2)
Total Protein: 7.8 g/dL (ref 6.0–8.3)

## 2024-06-10 LAB — LIPID PANEL
Cholesterol: 155 mg/dL (ref 0–200)
HDL: 98.6 mg/dL (ref >39.00)
LDL Cholesterol: 48 mg/dL (ref 0–99)
NonHDL: 56.2
Total CHOL/HDL Ratio: 2
Triglycerides: 43 mg/dL (ref 0.0–149.0)
VLDL: 8.6 mg/dL (ref 0.0–40.0)

## 2024-06-10 LAB — URINALYSIS, ROUTINE W REFLEX MICROSCOPIC
Bilirubin Urine: NEGATIVE
Ketones, ur: NEGATIVE
Nitrite: NEGATIVE
Specific Gravity, Urine: 1.01 (ref 1.000–1.030)
Total Protein, Urine: NEGATIVE
Urine Glucose: NEGATIVE
Urobilinogen, UA: 1 (ref 0.0–1.0)
pH: 7 (ref 5.0–8.0)

## 2024-06-10 LAB — TSH: TSH: 3.23 u[IU]/mL (ref 0.35–5.50)

## 2024-06-10 NOTE — Progress Notes (Signed)
 Subjective:  Patient ID: Brittany Mathis, female    DOB: [birth year]  Age: 78 y.o. MRN: [id number]  CC: Annual Exam   HPI Brittany Mathis presents for a well exam  Outpatient Medications Prior to Visit  Medication Sig Dispense Refill   alendronate  (FOSAMAX ) 70 MG tablet Take 1 tablet (70 mg total) by mouth every Tuesday. Take with a full glass of water on an empty stomach. 12 tablet 1   aspirin  EC 81 MG tablet Take 81 mg by mouth daily with lunch.      Cholecalciferol (VITAMIN D ) 50 MCG (2000 UT) tablet Take 2,000 Units by mouth daily with lunch.     metoprolol  succinate (TOPROL -XL) 25 MG 24 hr tablet TAKE 1 TABLET(25 MG) BY MOUTH DAILY 90 tablet 0   Propylene Glycol (SYSTANE COMPLETE) 0.6 % SOLN Place 1 drop into both eyes daily.     REPATHA  SURECLICK 140 MG/ML SOAJ ADMINISTER 1 ML UNDER THE SKIN EVERY 14 DAYS 6 mL 3   rosuvastatin  (CRESTOR ) 40 MG tablet TAKE 1 TABLET(40 MG) BY MOUTH DAILY 90 tablet 3   valsartan  (DIOVAN ) 160 MG tablet TAKE 1 TABLET(160 MG) BY MOUTH DAILY 90 tablet 3   nitroGLYCERIN  (NITROSTAT ) 0.4 MG SL tablet Place 1 tablet (0.4 mg total) under the tongue every 5 (five) minutes as needed for chest pain. (Patient not taking: Reported on 06/10/2024) 25 tablet 3   Facility-Administered Medications Prior to Visit  Medication Dose Route Frequency Provider Last Rate Last Admin   regadenoson  (LEXISCAN ) injection SOLN 0.4 mg  0.4 mg Intravenous Once Raford Riggs, MD       technetium tetrofosmin  (TC-MYOVIEW ) injection 29.4 millicurie  29.4 millicurie Intravenous Once PRN Raford Riggs, MD        ROS: Review of Systems  Constitutional:  Negative for activity change, appetite change, chills, fatigue and unexpected weight change.  HENT:  Negative for congestion, mouth sores and sinus pressure.   Eyes:  Negative for visual disturbance.  Respiratory:  Negative for cough and chest tightness.   Gastrointestinal:  Negative for abdominal pain and nausea.  Genitourinary:   Negative for difficulty urinating, frequency and vaginal pain.  Musculoskeletal:  Negative for back pain and gait problem.  Skin:  Negative for pallor and rash.  Neurological:  Negative for dizziness, tremors, weakness, numbness and headaches.  Psychiatric/Behavioral:  Negative for confusion and sleep disturbance.     Objective:  BP 130/80 (BP Location: Left Arm, Patient Position: Sitting)   Pulse (!) 35   Temp 97.6 F (36.4 C) (Oral)   Ht 5' 3 (1.6 m)   Wt 91 lb (41.3 kg)   BMI 16.12 kg/m   BP Readings from Last 3 Encounters:  06/10/24 130/80  05/28/24 118/78  05/23/24 (!) 174/82    Wt Readings from Last 3 Encounters:  06/10/24 91 lb (41.3 kg)  05/28/24 92 lb (41.7 kg)  05/23/24 94 lb 3.2 oz (42.7 kg)    Physical Exam Constitutional:      General: She is not in acute distress.    Appearance: She is well-developed.  HENT:     Head: Normocephalic.     Right Ear: External ear normal.     Left Ear: External ear normal.     Nose: Nose normal.  Eyes:     General:        Right eye: No discharge.        Left eye: No discharge.     Conjunctiva/sclera: Conjunctivae normal.  Pupils: Pupils are equal, round, and reactive to light.  Neck:     Thyroid : No thyromegaly.     Vascular: No JVD.     Trachea: No tracheal deviation.  Cardiovascular:     Rate and Rhythm: Normal rate and regular rhythm.     Heart sounds: Normal heart sounds.  Pulmonary:     Effort: No respiratory distress.     Breath sounds: No stridor. No wheezing.  Abdominal:     General: Bowel sounds are normal. There is no distension.     Palpations: Abdomen is soft. There is no mass.     Tenderness: There is no abdominal tenderness. There is no guarding or rebound.  Musculoskeletal:        General: No tenderness.     Cervical back: Normal range of motion and neck supple. No rigidity.     Right lower leg: No edema.     Left lower leg: No edema.  Lymphadenopathy:     Cervical: No cervical adenopathy.   Skin:    Findings: No erythema or rash.  Neurological:     Cranial Nerves: No cranial nerve deficit.     Motor: No abnormal muscle tone.     Coordination: Coordination normal.     Deep Tendon Reflexes: Reflexes normal.  Psychiatric:        Behavior: Behavior normal.        Thought Content: Thought content normal.        Judgment: Judgment normal.   Thin  Lab Results  Component Value Date   WBC 4.7 12/11/2023   HGB 14.2 12/11/2023   HCT 42.6 12/11/2023   PLT 217.0 12/11/2023   GLUCOSE 106 (H) 12/11/2023   CHOL 139 12/11/2023   TRIG 41.0 12/11/2023   HDL 98.60 12/11/2023   LDLDIRECT 109.2 02/05/2012   LDLCALC 32 12/11/2023   ALT 40 (H) 12/11/2023   AST 41 (H) 12/11/2023   NA 135 12/11/2023   K 4.9 12/11/2023   CL 98 12/11/2023   CREATININE 0.58 12/11/2023   BUN 11 12/11/2023   CO2 28 12/11/2023   TSH 2.73 12/11/2023    VAS US  CAROTID Result Date: 11/19/2019 Carotid Arterial Duplex Study Indications:  Bilateral bruits. Patient denies any cerebrovascular symptoms, but               says she does have a strong family history of cardiac diseaes and               an elevated coronary calcium  score. Risk Factors: Hypertension, hyperlipidemia, no history of smoking. Performing Technologist: Edsel Mustard RVT  Examination Guidelines: A complete evaluation includes B-mode imaging, spectral Doppler, color Doppler, and power Doppler as needed of all accessible portions of each vessel. Bilateral testing is considered an integral part of a complete examination. Limited examinations for reoccurring indications may be performed as noted.  Right Carotid Findings: +----------+--------+--------+--------+------------------+--------+           PSV cm/sEDV cm/sStenosisPlaque DescriptionComments +----------+--------+--------+--------+------------------+--------+ CCA Prox  74      15                                          +----------+--------+--------+--------+------------------+--------+ CCA Distal61      13                                         +----------+--------+--------+--------+------------------+--------+  ICA Prox  54      14      1-39%   heterogenous               +----------+--------+--------+--------+------------------+--------+ ICA Mid   52      18                                         +----------+--------+--------+--------+------------------+--------+ ICA Distal108     30                                         +----------+--------+--------+--------+------------------+--------+ ECA       48      7                                          +----------+--------+--------+--------+------------------+--------+ +----------+--------+-------+----------------+-------------------+           PSV cm/sEDV cmsDescribe        Arm Pressure (mmHG) +----------+--------+-------+----------------+-------------------+ [id number]            Multiphasic, [id number]                 +----------+--------+-------+----------------+-------------------+ +---------+--------+--+--------+--+---------+ VertebralPSV cm/s70EDV cm/s17Antegrade +---------+--------+--+--------+--+---------+  Left Carotid Findings: +----------+--------+--------+--------+------------------+--------+           PSV cm/sEDV cm/sStenosisPlaque DescriptionComments +----------+--------+--------+--------+------------------+--------+ CCA Prox  60      14                                         +----------+--------+--------+--------+------------------+--------+ CCA Distal54      16                                         +----------+--------+--------+--------+------------------+--------+ ICA Prox  60      18              heterogenous               +----------+--------+--------+--------+------------------+--------+ ICA Mid   87      26      1-39%                               +----------+--------+--------+--------+------------------+--------+ ICA Distal89      34                                         +----------+--------+--------+--------+------------------+--------+ ECA       63      5                                          +----------+--------+--------+--------+------------------+--------+ +----------+--------+--------+----------------+-------------------+           PSV cm/sEDV cm/sDescribe        Arm Pressure (mmHG) +----------+--------+--------+----------------+-------------------+ [id number]  Multiphasic, TWO859                 +----------+--------+--------+----------------+-------------------+ +---------+--------+--+--------+-+---------+ VertebralPSV cm/s23EDV cm/s7Antegrade +---------+--------+--+--------+-+---------+   Summary: Right Carotid: Velocities in the right ICA are consistent with a 1-39% stenosis. Left Carotid: Velocities in the left ICA are consistent with a 1-39% stenosis. Vertebrals:  Bilateral vertebral arteries demonstrate antegrade flow. Subclavians: Normal flow hemodynamics were seen in bilateral subclavian              arteries. *See table(s) above for measurements and observations.  Electronically signed by Candyce Reek MD on 11/19/2019 at 11:50:52 AM.    Final     Assessment & Plan:   Problem List Items Addressed This Visit     CAD (coronary artery disease)   Continue on Crestor +Repatha       Hyperlipidemia   On Crestor , Repatha       Relevant Orders   TSH   Lipid panel   Well adult exam - Primary    We discussed age appropriate health related issues, including available/recomended screening tests and vaccinations. Labs were ordered to be later reviewed . All questions were answered. We discussed one or more of the following - seat belt use, use of sunscreen/sun exposure exercise,  fall risk reduction, second hand smoke exposure, firearm use and storage, seat belt use, a need for adhering  to healthy diet and exercise. Labs were ordered.  All questions were answered.   Brittany Mathis GYN NP Mammo - yearly Eye exam Dr Charmayne DEXA 2021      Relevant Orders   TSH   Urinalysis   CBC with Differential/Platelet   Lipid panel   Comprehensive metabolic panel with GFR      No orders of the defined types were placed in this encounter.     Follow-up: Return in about 6 months (around 12/11/2024) for a follow-up visit.  Brittany Noel, MD

## 2024-06-10 NOTE — Assessment & Plan Note (Signed)
 On Crestor, Repatha

## 2024-06-10 NOTE — Assessment & Plan Note (Signed)
  We discussed age appropriate health related issues, including available/recomended screening tests and vaccinations. Labs were ordered to be later reviewed . All questions were answered. We discussed one or more of the following - seat belt use, use of sunscreen/sun exposure exercise,  fall risk reduction, second hand smoke exposure, firearm use and storage, seat belt use, a need for adhering to healthy diet and exercise. Labs were ordered.  All questions were answered.   Brittany Mathis GYN NP Mammo - yearly Eye exam Dr Randon Goldsmith DEXA 2021

## 2024-06-10 NOTE — Assessment & Plan Note (Signed)
 Continue on Crestor+Repatha

## 2024-06-11 ENCOUNTER — Ambulatory Visit: Payer: Self-pay | Admitting: Internal Medicine

## 2024-06-11 MED ORDER — CEPHALEXIN 500 MG PO CAPS: 500.0000 mg | ORAL_CAPSULE | Freq: Three times a day (TID) | ORAL | 0 refills | Status: AC

## 2024-06-12 ENCOUNTER — Telehealth: Payer: Self-pay

## 2024-06-12 ENCOUNTER — Other Ambulatory Visit: Payer: Self-pay | Admitting: Cardiology

## 2024-06-12 NOTE — Telephone Encounter (Signed)
 Pt states that she can't get into [facility] to get a Dexa scan until Feb 2026. She would like to go to [place], she can get in their office in September.    The [facility] form has been completed and faxed to [facility]. Received a conformation that the faxed was a success.   Jami signed the form since Tiffany was out of the office.    Pt has been provided the phone number to cancel the appt. At [facility]. [phone]. And that an order will need to be faxed to [facility]. Left a detailed message providing that information.

## 2024-06-24 ENCOUNTER — Other Ambulatory Visit: Payer: Self-pay | Admitting: Cardiology

## 2024-07-07 DIAGNOSIS — M85852 Other specified disorders of bone density and structure, left thigh: Secondary | ICD-10-CM | POA: Diagnosis not present

## 2024-07-07 DIAGNOSIS — M85851 Other specified disorders of bone density and structure, right thigh: Secondary | ICD-10-CM | POA: Diagnosis not present

## 2024-07-07 LAB — HM DEXA SCAN

## 2024-07-09 ENCOUNTER — Encounter: Payer: Self-pay | Admitting: Internal Medicine

## 2024-07-09 ENCOUNTER — Ambulatory Visit: Payer: Self-pay | Admitting: Radiology

## 2024-07-09 ENCOUNTER — Telehealth: Payer: Self-pay | Admitting: *Deleted

## 2024-07-09 NOTE — Telephone Encounter (Signed)
 Please let Martina know I have reviewed her DEXA and agree with Jami's recommendations. Continue Fosamax . Calcium  is a little controversial just because too much calcium  can cause other problems. If she feels she gets adequate Calcium  in her diet (~1200 mg/day) she can skip the calcium  supplement. The K2 is a newer recommendation and as Jami mentioned this just helps to make sure the Calcium  is being directed to the bones and teeth and preventing build up in the arteries.

## 2024-07-09 NOTE — Telephone Encounter (Signed)
 Spoke with patient. Patient states she reviewed results of BMD per Jami. Patient is requesting that Tiffany to review as well. Patient states she has never been recommended to take Calcium  or K2, takes Vit D.   Explained to patient why BMD was read by [facility], patient verbalizes understanding.   Routing to Tiffany to review patients concerns.

## 2024-07-09 NOTE — Telephone Encounter (Signed)
 Spoke with patient, advised per Tiffany. Patient verbalizes understanding and is appreciative of call.   Encounter closed.

## 2024-08-25 DIAGNOSIS — H401131 Primary open-angle glaucoma, bilateral, mild stage: Secondary | ICD-10-CM | POA: Diagnosis not present

## 2024-08-25 DIAGNOSIS — Z961 Presence of intraocular lens: Secondary | ICD-10-CM | POA: Diagnosis not present

## 2024-10-27 ENCOUNTER — Telehealth: Payer: Self-pay

## 2024-10-27 NOTE — Telephone Encounter (Signed)
Left message for return call to triage

## 2024-10-27 NOTE — Telephone Encounter (Signed)
 Pt called, requesting a call back, to discuss questions she has regarding her Dexa and her prescriptions.

## 2024-10-31 ENCOUNTER — Telehealth: Payer: Self-pay | Admitting: Pharmacy Technician

## 2024-10-31 DIAGNOSIS — E785 Hyperlipidemia, unspecified: Secondary | ICD-10-CM

## 2024-10-31 MED ORDER — ROSUVASTATIN CALCIUM 40 MG PO TABS: ORAL_TABLET | ORAL | 3 refills | Status: AC

## 2024-10-31 NOTE — Progress Notes (Addendum)
" ° °  10/31/2024  Patient ID: Jenkins VEAR Pizza, female   DOB: August 04, 1946, 79 y.o.   MRN: [id number]  Pharmacy Quality Measure Review  This patient failed the 2025 adherence measure for cholesterol (statin) medications this calendar year.   Medication: Rosuvastatin  Last fill date: 06/13/2024 for 90 day supply  Left voicemail for patient to return my call at their convenience. and reviewed chart.Rosuvastatin  still on patient's medication list. Per office visit with PCP on  06/10/2024 medication was to be continued along with Repatha . Of note, Repatha  was last filled on 08/05/24 and 10/29/24. Patient seems to be filing Repatha  regularly per Dr. Annemarie. Lipid panel last one in Aug 2025.  Patient returned the call 11/01/2023 2:30PM  Spoke with patient, HIPAA verified. Patient informs she is still taking Rosuvastatin  40mg . However, she informs that PCP Dr. Garald had informed her that she could take the medication every other day and that is what she has been doing. She informs she has 13 tablets in her bottle currently. Will pass information on to clinic PharmD for assistance.  Ovetta Bazzano, CPhT [facility] Office: [phone] Email: [email or web address]  "

## 2024-10-31 NOTE — Addendum Note (Signed)
 Addended by: MERCEDA DARRELYN SAUNDERS on: 10/31/2024 02:54 PM   Modules accepted: Orders

## 2024-10-31 NOTE — Progress Notes (Signed)
 Pt reports taking rosuvastatin  every other day per Dr. Alexandra recommendation. Updated rosuvastatin  Rx so sig reflects how she is currently taking it.  Darrelyn Drum, PharmD, BCPS, CPP Clinical Pharmacist Practitioner [facility] [phone]

## 2024-12-02 ENCOUNTER — Other Ambulatory Visit (HOSPITAL_BASED_OUTPATIENT_CLINIC_OR_DEPARTMENT_OTHER)

## 2024-12-11 ENCOUNTER — Ambulatory Visit: Admitting: Internal Medicine
